# Patient Record
Sex: Male | Born: 1985 | Hispanic: No | Marital: Single | Smoking: Never smoker
Health system: Southern US, Community
[De-identification: ages and names within clinical notes are randomized; demographics above are authoritative.]

## PROBLEM LIST (undated history)

## (undated) DIAGNOSIS — Q622 Congenital megaureter: Secondary | ICD-10-CM

## (undated) DIAGNOSIS — Z8489 Family history of other specified conditions: Secondary | ICD-10-CM

## (undated) DIAGNOSIS — J320 Chronic maxillary sinusitis: Secondary | ICD-10-CM

## (undated) DIAGNOSIS — S01511A Laceration without foreign body of lip, initial encounter: Secondary | ICD-10-CM

## (undated) DIAGNOSIS — R519 Headache, unspecified: Secondary | ICD-10-CM

## (undated) DIAGNOSIS — M5020 Other cervical disc displacement, unspecified cervical region: Secondary | ICD-10-CM

## (undated) DIAGNOSIS — J342 Deviated nasal septum: Secondary | ICD-10-CM

## (undated) DIAGNOSIS — M19079 Primary osteoarthritis, unspecified ankle and foot: Secondary | ICD-10-CM

## (undated) DIAGNOSIS — J322 Chronic ethmoidal sinusitis: Secondary | ICD-10-CM

## (undated) DIAGNOSIS — I890 Lymphedema, not elsewhere classified: Secondary | ICD-10-CM

## (undated) DIAGNOSIS — Z98811 Dental restoration status: Secondary | ICD-10-CM

## (undated) DIAGNOSIS — R51 Headache: Secondary | ICD-10-CM

## (undated) DIAGNOSIS — M5030 Other cervical disc degeneration, unspecified cervical region: Secondary | ICD-10-CM

## (undated) HISTORY — DX: Lymphedema, not elsewhere classified: I89.0

## (undated) HISTORY — PX: SHOULDER SURGERY: SHX246

---
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
## 2008-08-28 ENCOUNTER — Encounter: Admission: RE | Admit: 2008-08-28 | Discharge: 2008-08-28 | Payer: Self-pay | Admitting: Gastroenterology

## 2011-01-09 NOTE — Op Note (Signed)
Paul Humphrey, Paul Humphrey                  ACCOUNT NO.:  [id number]   MEDICAL RECORD NO.:  [id number]          PATIENT TYPE:  AMB   LOCATION:  DSC                          FACILITY:  [facility]   PHYSICIAN:  Loreta Ave, M.D. DATE OF BIRTH:  [birth year]   DATE OF PROCEDURE:  08/12/2005  DATE OF DISCHARGE:                                 OPERATIVE REPORT   PREOPERATIVE DIAGNOSIS:  Symptomatic fascial hernia, lateral compartment,  left leg.   POSTOPERATIVE DIAGNOSIS:  Symptomatic fascial hernia, lateral compartment,  left leg, with partial entrapment of muscle and superficial branch of the  peroneal nerve.   PROCEDURE:  Exploration and then release of fascia over the lateral  compartment, left leg.   SURGEON:  Loreta Ave, M.D.   ASSISTANT:  Genene Churn. Denton Meek.   ANESTHESIA:  General.   ESTIMATED BLOOD LOSS:  Minimal.   TOURNIQUET TIME:  45 minutes.   SPECIMENS:  None.   CULTURES:  None.   COMPLICATIONS:  None.   DRESSINGS:  Soft compressive.   DESCRIPTION OF PROCEDURE:  The patient was brought to the operating room and  after adequate anesthesia had been obtained, a tourniquet was applied to the  upper aspect of the left leg.  Prepped and draped in the usual sterile  fashion.  Exsanguinated with elevation of Esmarch, tourniquet inflated to  350 mmHg.   The hernia was palpable a little bit more than 2 cm in diameter, junction  middle distal third of the leg just behind the intermuscular septum over the  lateral compartment.  Longitudinal incision beginning at the hernia and  going proximal.  Fascia exposed.  Obvious hernia with muscle herniating  through this.  Gently opened and enlarged.  Superficial branch of peroneal  nerve right at the margin of this.  That was protected throughout.  I did a  complete release of the lateral compartment subcutaneously to the proximal  extent and distal extent under direct visualization.  Care taken to avoid  neurovascular structures  with release.  Resolution of the hernia by fascia  release.  Superficial branch of peroneal nerve identified and intact,  protected throughout.  Wound irrigated.  Closed with subcutaneous  and subcuticular Vicryl and Steri-Strips.  Wound injected with Marcaine.  Sterile compressive dressing applied.  Tourniquet deflated and removed.  Anesthesia reversed.   Brought to the recovery room.  Tolerated surgery well, no complications.      Loreta Ave, M.D.  Electronically Signed     DFM/MEDQ  D:  08/12/2005  T:  08/14/2005  Job:  [id number]

## 2011-08-11 ENCOUNTER — Other Ambulatory Visit: Payer: Self-pay | Admitting: Orthopedic Surgery

## 2011-08-11 DIAGNOSIS — M542 Cervicalgia: Secondary | ICD-10-CM

## 2011-08-12 ENCOUNTER — Ambulatory Visit
Admission: RE | Admit: 2011-08-12 | Discharge: 2011-08-12 | Disposition: A | Discharge status: Home / Self Care | Payer: Self-pay | Source: Ambulatory Visit | Attending: Orthopedic Surgery | Admitting: Orthopedic Surgery

## 2011-08-12 DIAGNOSIS — M542 Cervicalgia: Secondary | ICD-10-CM

## 2011-08-25 HISTORY — PX: COLOSTOMY: SHX63

## 2011-08-25 HISTORY — PX: COLECTOMY: SHX59

## 2012-08-15 ENCOUNTER — Telehealth: Payer: Self-pay

## 2012-08-15 ENCOUNTER — Ambulatory Visit (INDEPENDENT_AMBULATORY_CARE_PROVIDER_SITE_OTHER): Payer: BC Managed Care – PPO | Admitting: Emergency Medicine

## 2012-08-15 ENCOUNTER — Ambulatory Visit: Payer: BC Managed Care – PPO

## 2012-08-15 VITALS — BP 116/79 | HR 91 | Temp 98.2°F | Resp 16 | Ht 74.0 in | Wt 234.0 lb

## 2012-08-15 DIAGNOSIS — R05 Cough: Secondary | ICD-10-CM

## 2012-08-15 DIAGNOSIS — R059 Cough, unspecified: Secondary | ICD-10-CM

## 2012-08-15 DIAGNOSIS — J209 Acute bronchitis, unspecified: Secondary | ICD-10-CM

## 2012-08-15 DIAGNOSIS — J018 Other acute sinusitis: Secondary | ICD-10-CM

## 2012-08-15 MED ORDER — HYDROCOD POLST-CHLORPHEN POLST 10-8 MG/5ML PO LQCR: 5.0000 mL | Freq: Two times a day (BID) | ORAL | Status: DC | PRN

## 2012-08-15 MED ORDER — PSEUDOEPHEDRINE-GUAIFENESIN ER 60-600 MG PO TB12: 1.0000 | ORAL_TABLET | Freq: Two times a day (BID) | ORAL | Status: DC

## 2012-08-15 MED ORDER — LEVOFLOXACIN 500 MG PO TABS: 500.0000 mg | ORAL_TABLET | Freq: Every day | ORAL | Status: AC

## 2012-08-15 NOTE — Progress Notes (Signed)
[facility] [address] [place] [address] [phone]  Date:  08/15/2012   Name:  Paul Humphrey   DOB:  May 07, 1986   MRN:  [id number]  PCP:  Janace Hoard, MD    Chief Complaint: Sinusitis   History of Present Illness:  Paul Humphrey is a 27 y.o. very pleasant male patient who presents with the following:  Ill since Wednesday last week with nasal congestion and drainage.  Purulent drainage.  Has a cough that is likewise productive of purulent sputum.  Just finished a course of augmentin related to a perforated colon.  No fever or chills.   Discharged from hospital 12/8.  There is no problem list on file for this patient.   Past Medical History  Diagnosis Date  . Allergy     Past Surgical History  Procedure Date  . Colon surgery     History  Substance Use Topics  . Smoking status: Never Smoker   . Smokeless tobacco: Not on file  . Alcohol Use: No    Family History  Problem Relation Age of Onset  . Cancer Mother   . Arthritis Mother   . Arthritis Father     No Known Allergies  Medication list has been reviewed and updated.  Current Outpatient Prescriptions on File Prior to Visit  Medication Sig Dispense Refill  . Rivaroxaban (XARELTO) 20 MG TABS Take 20 mg by mouth daily.        Review of Systems:  As per HPI, otherwise negative.    Physical Examination: Filed Vitals:   08/15/12 1217  BP: 116/79  Pulse: 91  Temp: 98.2 F (36.8 C)  Resp: 16   Filed Vitals:   08/15/12 1217  Height: 6\' 2"  (1.88 m)  Weight: 234 lb (106.142 kg)   Body mass index is 30.04 kg/(m^2). Ideal Body Weight: Weight in (lb) to have BMI = 25: 194.3   GEN: WDWN, NAD, Non-toxic, A & O x 3 HEENT: Atraumatic, Normocephalic. Neck supple. No masses, No LAD. Ears and Nose: No external deformity. CV: RRR, No M/G/R. No JVD. No thrill. No extra heart sounds. PULM: CTA B, no wheezes, crackles, rhonchi. No retractions. No resp. distress. No accessory muscle  use. ABD: S, NT, ND, +BS. No rebound. No HSM. EXTR: No c/c/e NEURO Normal gait.  PSYCH: Normally interactive. Conversant. Not depressed or anxious appearing.  Calm demeanor.    Assessment and Plan: Bronchitis Sinusitis levaquin mucinex d tussionex Follow up as needed  Carmelina Dane, MD  [facility] reading (PRIMARY) by  Dr. Dareen Piano.  Negative chest.

## 2012-08-15 NOTE — Telephone Encounter (Signed)
PT STOPPED AT CHECKOUT WINDOW AFTER APPT WITH DR Dareen Piano TODAY AND WANTED TO MAKE SURE THE REFERRAL THEY TALKED ABOUT DURING THE OV GETS PLACED. SPOKE ABOUT A REVERSAL FROM RECENT GI SURGERY.  PLEASE CALL PT WITH QUESTIONS. BF

## 2012-08-15 NOTE — Telephone Encounter (Signed)
Please advise on referral, Egan Berkheimer

## 2012-08-24 HISTORY — PX: URETERAL STENT PLACEMENT: SHX822

## 2012-08-24 HISTORY — PX: COLOSTOMY REVERSAL: SHX5782

## 2013-01-26 ENCOUNTER — Ambulatory Visit (INDEPENDENT_AMBULATORY_CARE_PROVIDER_SITE_OTHER): Payer: 59 | Admitting: Physician Assistant

## 2013-01-26 ENCOUNTER — Telehealth: Payer: Self-pay | Admitting: Radiology

## 2013-01-26 VITALS — BP 136/74 | HR 108 | Temp 98.3°F | Resp 18 | Ht 74.0 in | Wt 249.0 lb

## 2013-01-26 DIAGNOSIS — R0602 Shortness of breath: Secondary | ICD-10-CM

## 2013-01-26 DIAGNOSIS — R059 Cough, unspecified: Secondary | ICD-10-CM

## 2013-01-26 DIAGNOSIS — R05 Cough: Secondary | ICD-10-CM

## 2013-01-26 DIAGNOSIS — J019 Acute sinusitis, unspecified: Secondary | ICD-10-CM

## 2013-01-26 MED ORDER — IPRATROPIUM BROMIDE 0.06 % NA SOLN: 2.0000 | Freq: Three times a day (TID) | NASAL | Status: DC

## 2013-01-26 MED ORDER — LEVOFLOXACIN 500 MG PO TABS: 500.0000 mg | ORAL_TABLET | Freq: Every day | ORAL | Status: DC

## 2013-01-26 MED ORDER — HYDROCODONE-HOMATROPINE 5-1.5 MG/5ML PO SYRP: ORAL_SOLUTION | ORAL | Status: DC

## 2013-01-26 NOTE — Telephone Encounter (Signed)
Patient advised d dimer normal he should continue medications

## 2013-01-26 NOTE — Progress Notes (Signed)
Patient ID: Paul Humphrey MRN: [id number], DOB: 03/01/1986, 26 y.o. Date of Encounter: 01/26/2013, 11:37 AM  Primary Physician: Janace Hoard, MD  Chief Complaint: Sinusitis   HPI: 27 y.o. male with history below presents with a 1 week history of increasing nasal congestion, rhinorrhea, sinus pressure, post nasal drip, ST, and cough. Afebrile. No chills. Cough seems to be worse in the morning and when he lays down at nighttime. Some associated SOB without wheezing. States he has to stop himself he tries to take a deep breath. Feels like the cough is secondary to post nasal drip. Sinus pressure is greatest along the maxillary sinuses left greater than right. Muffled hearing waxes and wanes.    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 Colostomy was performed on 07/23/12 secondary to "accidental perforation" in [place]. While he was in the hospital this past fall/winter his IV was infiltrated and he ended up with superficial thrombophlebitis. Because of this he was started on Xarelto. This was stopped sometime in January or February.   No swelling, pain, or erythema of his legs. He does have a history of compartment syndrome along the left leg years ago that required surgery. Because of this he states the left leg has an "air pocket" and is always a little bigger than the right.        Past Medical History  Diagnosis Date  . Allergy      Home Meds: Prior to Admission medications   Medication Sig Start Date End Date Taking? Authorizing Provider  Rivaroxaban (XARELTO) 20 MG TABS Take 20 mg by mouth daily.   No Historical Provider, MD    Allergies: No Known Allergies  History   Social History  . Marital Status: Single    Spouse Name: N/A    Number of Children: N/A  . Years of Education: N/A   Occupational History  . Not on file.   Social History Main Topics  . Smoking status: Never Smoker   . Smokeless tobacco: Not on file  . Alcohol Use: No  . Drug  Use: No  . Sexually Active: Not on file   Other Topics Concern  . Not on file   Social History Narrative  . No narrative on file    Past Surgical History  Procedure Laterality Date  . Colon surgery  07/23/2012    Secondary to perforation   . 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 negative for chills or fever  HEENT: see above Cardiovascular: negative for chest pain, DOE, edema, or palpitations Respiratory: positive for SOB and cough. negative for hemoptysis, wheezing, tachypnea, or dyspnea   Abdominal: negative for abdominal pain, nausea, vomiting, or diarrhea Dermatological: negative for rash Neurologic: positive for headache. negative for dizziness, or syncope   Physical Exam: Blood pressure 136/74, pulse 108, temperature 98.3 F (36.8 C), temperature source Oral, resp. rate 18, height 6\' 2"  (1.88 m), weight 249 lb (112.946 kg), SpO2 98.00%., Body mass index is 31.96 kg/(m^2). General: Well developed, well nourished, in no acute distress. Well appearing.  Head: Normocephalic, atraumatic, eyes without discharge, sclera non-icteric, nares are without discharge. Bilateral auditory canals clear, TM's are without perforation, pearly grey and translucent with reflective cone of light bilaterally. Bilateral maxillary sinus TTP. Oral cavity moist, posterior pharynx with post nasal drip. No exudate, erythema, or peritonsillar abscess. Uvula midline.   Neck: Supple. No thyromegaly. Full ROM. No lymphadenopathy. Lungs: Clear bilaterally  to auscultation without wheezes, rales, or rhonchi. Breathing is unlabored. Heart: RRR with S1 S2. No murmurs, rubs, or gallops appreciated. Msk:  Strength and tone normal for age. Extremities/Skin: Warm and dry. No clubbing or cyanosis. No edema. No rashes or suspicious lesions. Right calf 15 cm down 40.5 cm. Left calf 15 cm down 42 cm. Calves supple, without erythema, or TTP.  Neuro: Alert and oriented X  3. Moves all extremities spontaneously. Gait is normal. CNII-XII grossly in tact. Psych:  Responds to questions appropriately with a normal affect.   Labs:  D dimer STAT  ASSESSMENT AND PLAN:  27 y.o. male with sinusitis, cough, and SOB.  1) Sinusitis  -Levaquin 500 mg 1 po daily #10 no RF -Atrovent NS 0.06% 2 sprays each nare bid prn #1 no RF -Mucinex -Rest/fluids  2) Cough -Hycodan #4oz 1 tsp po q 4-6 hours prn cough no RF SED -Possibly secondary to post nasal drip, see also below  3) SOB -STAT D dimer -History is concerning given his recent surgical procedure and travel. His PPX blood thinner had to be stopped in the hospital secondary to hematuria in the setting of ureteral stents. After thorough discussion he has elected to proceed with a D dimer. If this is elevated we will have him undergo a chest CTA and bilateral lower extremity dopplers.  -Take it easy   -D dimer call report to Amy at 3:23 PM 0.27. Patient notified.    Signed, Eula Listen, PA-C 01/26/2013 11:37 AM

## 2013-11-12 ENCOUNTER — Ambulatory Visit (INDEPENDENT_AMBULATORY_CARE_PROVIDER_SITE_OTHER): Payer: BC Managed Care – PPO | Admitting: Physician Assistant

## 2013-11-12 VITALS — BP 132/78 | HR 94 | Temp 98.3°F | Resp 16 | Ht 74.0 in | Wt 261.6 lb

## 2013-11-12 DIAGNOSIS — J309 Allergic rhinitis, unspecified: Secondary | ICD-10-CM

## 2013-11-12 DIAGNOSIS — J069 Acute upper respiratory infection, unspecified: Secondary | ICD-10-CM

## 2013-11-12 DIAGNOSIS — B9789 Other viral agents as the cause of diseases classified elsewhere: Principal | ICD-10-CM

## 2013-11-12 MED ORDER — BENZONATATE 100 MG PO CAPS: 100.0000 mg | ORAL_CAPSULE | Freq: Three times a day (TID) | ORAL | Status: DC | PRN

## 2013-11-12 MED ORDER — FLUTICASONE PROPIONATE 50 MCG/ACT NA SUSP: 2.0000 | Freq: Every day | NASAL | Status: DC

## 2013-11-12 MED ORDER — HYDROCODONE-HOMATROPINE 5-1.5 MG/5ML PO SYRP: 5.0000 mL | ORAL_SOLUTION | Freq: Three times a day (TID) | ORAL | Status: DC | PRN

## 2013-11-12 NOTE — Progress Notes (Signed)
   Subjective:    Patient ID: Paul Humphrey, male    DOB: 11/05/1985, 28 y.o.   MRN: [id number]  HPI   Paul Humphrey is a pleasant 28 yr old male here with concern for sinus infection.  He reports about 1 week of URI symptoms.  Green/brown/yellow nasal drainage.  Mild headache.  Right ear pressure.  Productive cough.  Post nasal drainage.  Laryngitis.  He has felt feverish but has not taken his temperature.  He denies SOB or wheezing.  No sore throat.  No known sick contacts.  Has used Coldeze and Dimetap for symptoms.  Cough is bothersome at night, dimetapp not providing good relief.  He does have trouble with allergies in spring and fall - uses loratadine prn.    Review of Systems  Constitutional: Positive for fever (subjective). Negative for chills.  HENT: Positive for congestion, ear pain (right), postnasal drip, rhinorrhea and voice change (laryngitis). Negative for sore throat.   Respiratory: Positive for cough. Negative for shortness of breath and wheezing.   Cardiovascular: Negative.   Gastrointestinal: Negative.   Musculoskeletal: Negative.   Skin: Negative.   Neurological: Positive for headaches.       Objective:   Physical Exam  Vitals reviewed. Constitutional: He is oriented to person, place, and time. He appears well-developed and well-nourished. No distress.  HENT:  Head: Normocephalic and atraumatic.  Right Ear: Ear canal normal. A middle ear effusion is present.  Left Ear: Tympanic membrane and ear canal normal.  Nose: Mucosal edema and rhinorrhea present. Right sinus exhibits no maxillary sinus tenderness and no frontal sinus tenderness. Left sinus exhibits no maxillary sinus tenderness and no frontal sinus tenderness.  Mouth/Throat: Uvula is midline, oropharynx is clear and moist and mucous membranes are normal.  Eyes: Conjunctivae are normal. No scleral icterus.  Neck: Neck supple.  Cardiovascular: Normal rate, regular rhythm and normal heart sounds.   Pulmonary/Chest:  Effort normal. He has no wheezes. He has no rales.  Abdominal: Soft. There is no tenderness.  Lymphadenopathy:    He has no cervical adenopathy.  Neurological: He is alert and oriented to person, place, and time.  Skin: Skin is warm and dry.  Psychiatric: He has a normal mood and affect. His behavior is normal.       Assessment & Plan:  Viral URI with cough - Plan: fluticasone (FLONASE) 50 MCG/ACT nasal spray, benzonatate (TESSALON) 100 MG capsule, HYDROcodone-homatropine (HYCODAN) 5-1.5 MG/5ML syrup  Allergic rhinitis - Plan: fluticasone (FLONASE) 50 MCG/ACT nasal spray   Paul Humphrey is a pleasant 28 yr old male with 1 week of URI symptoms.  He is afebrile, VSS, no sinus tenderness, lungs CTA bilaterally.  Suspect viral URI with possible allergic component.  There is no evidence of bacterial infection today.  Will start Flonase - 2 sprays each nostril BID x 1 wk, then decrease to once daily.  Add loratadine.  Consider neti pot or nasal saline.  Tessalon and Hycodan for cough.  Voice rest for laryngitis.  Push fluids, rest.  Discussed RTC precautions including fever, worsening cough, SOB, sinus pain.  Pt to call or RTC if worsening or not improving  E. Natividad Brood MHS, PA-C [facility] 3/22/20159:59 AM

## 2013-11-12 NOTE — Patient Instructions (Signed)
Begin using the Flonase 2 sprays twice daily x 1 week.  Then decrease to once daily.  This will help control allergy symptoms but will also help relieve sinus pressure and ear pressure.  You can continue this through allergy season  Try adding loratadine as well - this well help dry up congestion and stop post-nasal drainage  Consider trying a neti pot or using nasal saline spray - this will flush out your sinuses and keep your nasal passages moist which reduces irritation  Use the Tessalon Perles every 8 hours as needed for cough  Use the Hycodan syrup if needed - will likely make you sleepy  Drink lots of water, get plenty of rest  Please let us know if any symptoms are worsening or not improving   Upper Respiratory Infection, Adult An upper respiratory infection (URI) is also sometimes known as the common cold. The upper respiratory tract includes the nose, sinuses, throat, trachea, and bronchi. Bronchi are the airways leading to the lungs. Most people improve within 1 week, but symptoms can last up to 2 weeks. A residual cough may last even longer.  CAUSES Many different viruses can infect the tissues lining the upper respiratory tract. The tissues become irritated and inflamed and often become very moist. Mucus production is also common. A cold is contagious. You can easily spread the virus to others by oral contact. This includes kissing, sharing a glass, coughing, or sneezing. Touching your mouth or nose and then touching a surface, which is then touched by another person, can also spread the virus. SYMPTOMS  Symptoms typically develop 1 to 3 days after you come in contact with a cold virus. Symptoms vary from person to person. They may include:  Runny nose.  Sneezing.  Nasal congestion.  Sinus irritation.  Sore throat.  Loss of voice (laryngitis).  Cough.  Fatigue.  Muscle aches.  Loss of appetite.  Headache.  Low-grade fever. DIAGNOSIS  You might diagnose your  own cold based on familiar symptoms, since most people get a cold 2 to 3 times a year. Your caregiver can confirm this based on your exam. Most importantly, your caregiver can check that your symptoms are not due to another disease such as strep throat, sinusitis, pneumonia, asthma, or epiglottitis. Blood tests, throat tests, and X-rays are not necessary to diagnose a common cold, but they may sometimes be helpful in excluding other more serious diseases. Your caregiver will decide if any further tests are required. RISKS AND COMPLICATIONS  You may be at risk for a more severe case of the common cold if you smoke cigarettes, have chronic heart disease (such as heart failure) or lung disease (such as asthma), or if you have a weakened immune system. The very young and very old are also at risk for more serious infections. Bacterial sinusitis, middle ear infections, and bacterial pneumonia can complicate the common cold. The common cold can worsen asthma and chronic obstructive pulmonary disease (COPD). Sometimes, these complications can require emergency medical care and may be life-threatening. PREVENTION  The best way to protect against getting a cold is to practice good hygiene. Avoid oral or hand contact with people with cold symptoms. Wash your hands often if contact occurs. There is no clear evidence that vitamin C, vitamin E, echinacea, or exercise reduces the chance of developing a cold. However, it is always recommended to get plenty of rest and practice good nutrition. TREATMENT  Treatment is directed at relieving symptoms. There is no cure. Antibiotics  are not effective, because the infection is caused by a virus, not by bacteria. Treatment may include:  Increased fluid intake. Sports drinks offer valuable electrolytes, sugars, and fluids.  Breathing heated mist or steam (vaporizer or shower).  Eating chicken soup or other clear broths, and maintaining good nutrition.  Getting plenty of  rest.  Using gargles or lozenges for comfort.  Controlling fevers with ibuprofen or acetaminophen as directed by your caregiver.  Increasing usage of your inhaler if you have asthma. Zinc gel and zinc lozenges, taken in the first 24 hours of the common cold, can shorten the duration and lessen the severity of symptoms. Pain medicines may help with fever, muscle aches, and throat pain. A variety of non-prescription medicines are available to treat congestion and runny nose. Your caregiver can make recommendations and may suggest nasal or lung inhalers for other symptoms.  HOME CARE INSTRUCTIONS   Only take over-the-counter or prescription medicines for pain, discomfort, or fever as directed by your caregiver.  Use a warm mist humidifier or inhale steam from a shower to increase air moisture. This may keep secretions moist and make it easier to breathe.  Drink enough water and fluids to keep your urine clear or pale yellow.  Rest as needed.  Return to work when your temperature has returned to normal or as your caregiver advises. You may need to stay home longer to avoid infecting others. You can also use a face mask and careful hand washing to prevent spread of the virus. SEEK MEDICAL CARE IF:   After the first few days, you feel you are getting worse rather than better.  You need your caregiver's advice about medicines to control symptoms.  You develop chills, worsening shortness of breath, or brown or red sputum. These may be signs of pneumonia.  You develop yellow or brown nasal discharge or pain in the face, especially when you bend forward. These may be signs of sinusitis.  You develop a fever, swollen neck glands, pain with swallowing, or white areas in the back of your throat. These may be signs of strep throat. SEEK IMMEDIATE MEDICAL CARE IF:   You have a fever.  You develop severe or persistent headache, ear pain, sinus pain, or chest pain.  You develop wheezing, a  prolonged cough, cough up blood, or have a change in your usual mucus (if you have chronic lung disease).  You develop sore muscles or a stiff neck. Document Released: 02/03/2001 Document Revised: 11/02/2011 Document Reviewed: 12/12/2010 [facility] Patient Information 2014 [place], [place].

## 2013-11-18 ENCOUNTER — Telehealth: Payer: Self-pay

## 2013-11-18 NOTE — Telephone Encounter (Signed)
Pt is needing to talk with someone about his symptoms they have not improved

## 2013-11-18 NOTE — Telephone Encounter (Signed)
Still has cough and drainage. Cough is keeping up at night. Cough meds only working a little bit but not enough to keep him sleep.

## 2013-11-18 NOTE — Telephone Encounter (Signed)
PT IS WANTING TO TALK WITH SOMEONE BECAUSE THE SYMPTOMS HAVE NOT IMPROVED  BEST NUMBER

## 2013-11-19 NOTE — Telephone Encounter (Signed)
Has he been using the Flonase? He can also add Mucinex if not already doing so. Does he have a fever or productive cough?

## 2013-11-20 MED ORDER — GUAIFENESIN-CODEINE 100-10 MG/5ML PO SOLN: 5.0000 mL | Freq: Four times a day (QID) | ORAL | Status: DC | PRN

## 2013-11-20 MED ORDER — AMOXICILLIN 875 MG PO TABS: 1750.0000 mg | ORAL_TABLET | Freq: Two times a day (BID) | ORAL | Status: DC

## 2013-11-20 NOTE — Telephone Encounter (Signed)
Pt needs refill on a cough medication that can be called in. He is on his way back to school in [place]- He is also stating that now the cough is productive with thick green/brown phlegm. Can we also call in an antibiotic?   He needs the prescription to go to : [facility] [id number]

## 2013-11-20 NOTE — Telephone Encounter (Signed)
Meds ordered this encounter  Medications  . amoxicillin (AMOXIL) 875 MG tablet    Sig: Take 2 tablets (1,750 mg total) by mouth 2 (two) times daily.    Dispense:  20 tablet    Refill:  0    Order Specific Question:  Supervising Provider    Answer:  DOOLITTLE, ROBERT P [[id number]]  . guaiFENesin-codeine 100-10 MG/5ML syrup    Sig: Take 5 mLs by mouth every 6 (six) hours as needed for cough.    Dispense:  120 mL    Refill:  0    Order Specific Question:  Supervising Provider    Answer:  DOOLITTLE, ROBERT P [[id number]]    Please PHONE IN guaifenisen + codeine as above.

## 2013-11-20 NOTE — Telephone Encounter (Signed)
Advised pt

## 2014-02-01 ENCOUNTER — Ambulatory Visit (INDEPENDENT_AMBULATORY_CARE_PROVIDER_SITE_OTHER): Payer: BC Managed Care – PPO | Admitting: Podiatry

## 2014-02-01 ENCOUNTER — Encounter: Payer: Self-pay | Admitting: Podiatry

## 2014-02-01 VITALS — BP 114/74 | HR 102 | Resp 16 | Ht 74.0 in | Wt 250.0 lb

## 2014-02-01 DIAGNOSIS — L600 Ingrowing nail: Secondary | ICD-10-CM

## 2014-02-01 NOTE — Patient Instructions (Signed)

## 2014-02-01 NOTE — Progress Notes (Signed)
   Subjective:    Patient ID: Paul Humphrey, male    DOB: 07-01-1986, 28 y.o.   MRN: [id number]  HPI Comments: "I have an ingrown"  Patient c/o aching 1st toe right, medial border, for about 2-3 days. The area is bleeding and red. Yesterday it was draining. He has been soaking in epsom salt and neosporin.   Toe Pain       Review of Systems  Allergic/Immunologic: Positive for food allergies.  All other systems reviewed and are negative.      Objective:   Physical Exam        Assessment & Plan:

## 2014-02-05 NOTE — Progress Notes (Signed)
Subjective:     Patient ID: Paul Humphrey, male   DOB: 28-Oct-1985, 28 y.o.   MRN: [id number]  Toe Pain    patient presents stating I have a painful ingrown toenail on my right toe that I have tried to trim and soaks myself without relief. It's been off and on and worse recently   Review of Systems  All other systems reviewed and are negative.      Objective:   Physical Exam  Nursing note and vitals reviewed. Constitutional: He is oriented to person, place, and time.  Cardiovascular: Intact distal pulses.   Neurological: He is oriented to person, place, and time.  Skin: Skin is warm.   neurovascular status is found to be intact with health history within normal limits and range of motion subtalar midtarsal joint adequate. Muscle strength is adequate no equinus condition is noted and right hallux medial border is found to be incurvated with pain upon palpation to the border and distal redness noted    Assessment:     Ingrown toenail deformity right hallux with mild distal paronychia that's localized    Plan:     H&P reviewed and condition explained to patient. I recommended removal of the corner and doing this permanently so does not recur which is what the patient wants. He understands risk and today I infiltrated 60 mg Xylocaine Marcaine mixture after sterile prep to the area remove the medial corner exposed matrix and applied chemical phenol 3 applications 30 seconds followed by alcohol lavaged and sterile dressing. Gave instructions on soaks and reappoint

## 2014-08-06 ENCOUNTER — Ambulatory Visit (HOSPITAL_COMMUNITY)
Admission: RE | Admit: 2014-08-06 | Discharge: 2014-08-06 | Disposition: A | Discharge status: Home / Self Care | Payer: BC Managed Care – PPO | Source: Ambulatory Visit | Attending: Internal Medicine | Admitting: Internal Medicine

## 2014-08-06 ENCOUNTER — Ambulatory Visit (INDEPENDENT_AMBULATORY_CARE_PROVIDER_SITE_OTHER): Payer: BC Managed Care – PPO | Admitting: Internal Medicine

## 2014-08-06 ENCOUNTER — Telehealth: Payer: Self-pay

## 2014-08-06 VITALS — BP 120/80 | HR 89 | Temp 98.2°F | Resp 16 | Ht 73.0 in | Wt 242.0 lb

## 2014-08-06 DIAGNOSIS — R51 Headache: Secondary | ICD-10-CM | POA: Insufficient documentation

## 2014-08-06 DIAGNOSIS — R42 Dizziness and giddiness: Secondary | ICD-10-CM | POA: Diagnosis not present

## 2014-08-06 DIAGNOSIS — K219 Gastro-esophageal reflux disease without esophagitis: Secondary | ICD-10-CM

## 2014-08-06 DIAGNOSIS — G4459 Other complicated headache syndrome: Secondary | ICD-10-CM

## 2014-08-06 DIAGNOSIS — M542 Cervicalgia: Secondary | ICD-10-CM

## 2014-08-06 DIAGNOSIS — M5090 Cervical disc disorder, unspecified, unspecified cervical region: Secondary | ICD-10-CM

## 2014-08-06 DIAGNOSIS — G8929 Other chronic pain: Secondary | ICD-10-CM

## 2014-08-06 MED ORDER — MELOXICAM 15 MG PO TABS: 15.0000 mg | ORAL_TABLET | Freq: Every day | ORAL | Status: DC

## 2014-08-06 MED ORDER — RANITIDINE HCL 300 MG PO TABS: 300.0000 mg | ORAL_TABLET | Freq: Every day | ORAL | Status: DC

## 2014-08-06 NOTE — Telephone Encounter (Signed)
Ct report that Dr. Laney Pastor ordered is ready, please advise pt

## 2014-08-06 NOTE — Patient Instructions (Signed)
Go to [facility] and register at Radiology for outpatient CT Head

## 2014-08-06 NOTE — Progress Notes (Signed)
   Subjective:    Patient ID: Paul Humphrey, male    DOB: [birth year], 28 y.o.   MRN: [id number]  HPI Complaining of a new onset pressure type headache at the top of the head that has been intermittent for the past 6-8 weeks and progressively worse and progressively more frequent. This is associated with a vague feeling of dizziness and uneasiness but no clear syncope or vertigo. There is been no change in vision. No associated nausea. No peripheral neurological symptoms of numbness or tingling or weakness.  He does have significant right-sided neck discomfort which has been present for more than a year and gets worse with his activities. MRI revealed bulging disks at C4 and 5 but surgery is not indicated.  He works as a Retail buyer stressful semester////he is very concerned about his symptoms and this sometimes interferes with his sleep at night///most concerned about a brain tumor   Past medical history lots of allergies and sinus infections Past surgical history colectomy from colon Trauma now stable   Review of Systems No chest pain or palpitations No shortness of breath No abdominal pain although he does have postprandial dyspepsia for the last 6 weeks for the first time ever//no nocturnal pain/ no alcohol/little caffeine      Objective:   Physical Exam BP 120/80 mmHg  Pulse 89  Temp(Src) 98.2 F (36.8 C) (Oral)  Resp 16  Ht 6\' 1"  (1.854 m)  Wt 242 lb (109.77 kg)  BMI 31.93 kg/m2  SpO2 99% Remains mildly overweight PERRLA and EOMs conjugate without nystagmus Boggy turbinates   TMs clear, throat clear, no cervical nodes, no thyromegaly Heart regular without murmur Lungs clear to auscultation Abdomen benign Extremities without edema Cranial nerves II through XII intact Cerebellar intact No sensory or motor losses Deep tendon reflexes symmetrical The neck range of motion includes discomfort with flexion-extension and  rotation to the right- He feels vaguely lightheaded with change of position but there is no observed nystagmus or loss of posture control     Assessment & Plan:  Other complicated headache syndrome w/ Dizzy spells - Plan: CT Head Wo Contrast, ----with his mild anxiety and obsessive thinking he needs to have brain tumor ruled out and we need to investigate the possibility of underlying sinus disease as a cause of his symptoms//long history of allergic rhinitis     Neck pain of over 3 months duration - Plan: Ambulatory referral to Physical Therapy  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

## 2014-08-06 NOTE — Telephone Encounter (Signed)
Dr. Laney Pastor has been advised.

## 2014-08-07 NOTE — Telephone Encounter (Signed)
Patient LMOM in referrals asking about results of CT Scan please call patient at [phone]

## 2014-08-24 HISTORY — PX: CHOLECYSTECTOMY: SHX55

## 2014-08-24 HISTORY — PX: UMBILICAL HERNIA REPAIR: SHX196

## 2014-08-30 ENCOUNTER — Ambulatory Visit: Payer: BLUE CROSS/BLUE SHIELD

## 2014-09-03 ENCOUNTER — Ambulatory Visit (INDEPENDENT_AMBULATORY_CARE_PROVIDER_SITE_OTHER): Payer: BLUE CROSS/BLUE SHIELD | Admitting: Internal Medicine

## 2014-09-03 VITALS — BP 122/78 | HR 104 | Temp 98.6°F | Resp 18 | Ht 73.5 in | Wt 244.0 lb

## 2014-09-03 DIAGNOSIS — K219 Gastro-esophageal reflux disease without esophagitis: Secondary | ICD-10-CM

## 2014-09-03 DIAGNOSIS — R0789 Other chest pain: Secondary | ICD-10-CM

## 2014-09-03 DIAGNOSIS — M542 Cervicalgia: Secondary | ICD-10-CM

## 2014-09-03 DIAGNOSIS — J069 Acute upper respiratory infection, unspecified: Secondary | ICD-10-CM

## 2014-09-03 DIAGNOSIS — G8929 Other chronic pain: Secondary | ICD-10-CM

## 2014-09-03 DIAGNOSIS — B9789 Other viral agents as the cause of diseases classified elsewhere: Principal | ICD-10-CM

## 2014-09-03 MED ORDER — RANITIDINE HCL 300 MG PO TABS: 300.0000 mg | ORAL_TABLET | Freq: Every day | ORAL | Status: DC

## 2014-09-03 NOTE — Progress Notes (Signed)
Subjective:  This chart was scribed for Paul Koyanagi, MD by Ladene Artist, ED Scribe. The patient was seen in room  5. Patient's care was started at 10:05 AM.   Patient ID: Leanora Humphrey, male    DOB: Jul 20, 1986, 29 y.o.   MRN: [id number]  Chief Complaint  Patient presents with  . Follow-up    meds  . Sinusitis    since the 31   . Nasal Congestion  . Cough    green mucous    HPI HPI Comments: Paul Humphrey is a 29 y.o. male who presents to the [facility] for a follow-up regarding sinusitis. Pt reports gradually improving nasal congestion, sinus pressure, postnasal drip, intermittent cough with green phlegm. Pt states that Zantac has improved. He reports occasional indigestion with eating spicy foods and burnt popcorn.   Chest Pain Pt reports intermittent L sided chest pain onset this morning. He states that he has experienced 8 episodes of sharp, stabbing sensations from 8:16-8:30 AM today with associated nausea that lasted for 4 minutes. He reports a total of 10 episodes since onset this morning. Chest pain is recreated with moving his L shoulder. He denies SOB, diaphoresis, palpitations, ear pain, sleep disturbances. He has had some pain off and on since starting physical therapy for his neck which is greatly improving.  Lips Pt states that he noticed that his lips have felt "pruney" over the past 5 days. He also reports associated numbness and discoloration to his lips that he describes as tan. Pt has been treating with Vaseline without relief. Also having cold symptoms with congestion and nonproductive cough. History of recurrent sinusitis.  Physical Therapy  Pt states that neck pain has improved since staring physical therapy. He reports a stabbing sensation in bilateral chest that lasted for 10 minutes on 08/28/13 following PT.   He also has had an improvement in reflux since starting Zantac this symptom is currently stable.  Past Medical History  Diagnosis  Date  . Allergy    Current Outpatient Prescriptions on File Prior to Visit  Medication Sig Dispense Refill  . ranitidine (ZANTAC) 300 MG tablet Take 1 tablet (300 mg total) by mouth at bedtime. 30 tablet 2  . dextromethorphan-guaiFENesin (MUCINEX DM) 30-600 MG per 12 hr tablet Take 1 tablet by mouth 2 (two) times daily.    . meloxicam (MOBIC) 15 MG tablet Take 1 tablet (15 mg total) by mouth daily. (Patient not taking: Reported on 09/03/2014) 30 tablet 0   No current facility-administered medications on file prior to visit.   Allergies  Allergen Reactions  . 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  HENT: Positive for congestion, postnasal drip and sinus pressure. Negative for ear pain.   Respiratory: Positive for cough. Negative for shortness of breath.   Cardiovascular: Positive for chest pain. Negative for palpitations.  Gastrointestinal: Positive for nausea (resolved).  Neurological: Positive for numbness.  Psychiatric/Behavioral: Negative for sleep disturbance.      Objective:   Physical Exam  Constitutional: He is oriented to person, place, and time. He appears well-developed and well-nourished. No distress.  HENT:  Head: Normocephalic and atraumatic.  Right Ear: Tympanic membrane normal.  Left Ear: Tympanic membrane normal.  Nose: Rhinorrhea (clear) present.  Mouth/Throat: Oropharynx is clear and moist.  Lips are dry and chapped.   Eyes: Conjunctivae and EOM are normal.  Neck: Neck supple.  Cardiovascular: Normal rate and regular rhythm.   Pulmonary/Chest: Effort normal and breath sounds normal.  Tender to L anterior chest wall and the upper ribs. Lungs are clear.   Musculoskeletal: Normal range of motion.  Lymphadenopathy:    He has no cervical adenopathy.  Neurological: He is alert and oriented to person, place, and time.  Skin: Skin is  warm and dry.  Psychiatric: He has a normal mood and affect. His behavior is normal.  Nursing note and vitals reviewed. BP 122/78 mmHg  Pulse 104  Temp(Src) 98.6 F (37 C) (Oral)  Resp 18  Ht 6' 1.5" (1.867 m)  Wt 244 lb (110.678 kg)  BMI 31.75 kg/m2  SpO2 99%    Assessment & Plan:  Viral URI with cough  Gastroesophageal reflux disease without esophagitis  Neck pain of over 3 months duration  Left-sided chest wall pain  Meds ordered this encounter  Medications  . ranitidine (ZANTAC) 300 MG tablet    Sig: Take 1 tablet (300 mg total) by mouth at bedtime.    Dispense:  90 tablet    Refill:  2  12h Sudafed twice a day for 3-4 days Vaseline for lips  I have completed the patient encounter in its entirety as documented by the scribe, with editing by me where necessary. Elasia Furnish P. Laney Pastor, M.D.

## 2014-09-07 ENCOUNTER — Telehealth: Payer: Self-pay

## 2014-09-07 NOTE — Telephone Encounter (Signed)
Call--unusual for zantac---stop it --use maalox for symptoms qid if needed---then use otc pepcid twice a day for a week to see if relieves symptoms

## 2014-09-07 NOTE — Telephone Encounter (Signed)
Pt called. States zantac is keeping him awake at night and is causing some confusion. Please advise. Cb # [phone]

## 2014-09-07 NOTE — Telephone Encounter (Signed)
Spoke to pt- he states that he is starting to experience increased heartburn after taking Zantac and disturbed sleep. He states that this is new- just since refilling script. Upon waking, pt does not have an appetite and has been a little nauseous.   Pt is now out of town in [place] now. He will not be back until May. He would like something different called in.  Pharmacy- [facility] has been changed.

## 2014-09-09 NOTE — Telephone Encounter (Signed)
Pt.notified

## 2014-10-04 DIAGNOSIS — R1013 Epigastric pain: Secondary | ICD-10-CM | POA: Insufficient documentation

## 2014-12-06 ENCOUNTER — Telehealth: Payer: Self-pay

## 2015-01-07 ENCOUNTER — Ambulatory Visit (INDEPENDENT_AMBULATORY_CARE_PROVIDER_SITE_OTHER): Payer: BLUE CROSS/BLUE SHIELD | Admitting: Emergency Medicine

## 2015-01-07 VITALS — BP 110/60 | HR 72 | Temp 98.3°F | Resp 16 | Ht 75.0 in | Wt 229.0 lb

## 2015-01-07 DIAGNOSIS — L659 Nonscarring hair loss, unspecified: Secondary | ICD-10-CM | POA: Diagnosis not present

## 2015-01-07 DIAGNOSIS — R202 Paresthesia of skin: Secondary | ICD-10-CM | POA: Insufficient documentation

## 2015-01-07 DIAGNOSIS — R109 Unspecified abdominal pain: Secondary | ICD-10-CM | POA: Insufficient documentation

## 2015-01-07 DIAGNOSIS — E559 Vitamin D deficiency, unspecified: Secondary | ICD-10-CM

## 2015-01-07 DIAGNOSIS — R1084 Generalized abdominal pain: Secondary | ICD-10-CM | POA: Diagnosis not present

## 2015-01-07 DIAGNOSIS — M542 Cervicalgia: Secondary | ICD-10-CM

## 2015-01-07 DIAGNOSIS — F439 Reaction to severe stress, unspecified: Secondary | ICD-10-CM

## 2015-01-07 DIAGNOSIS — Z658 Other specified problems related to psychosocial circumstances: Secondary | ICD-10-CM

## 2015-01-07 DIAGNOSIS — Z202 Contact with and (suspected) exposure to infections with a predominantly sexual mode of transmission: Secondary | ICD-10-CM

## 2015-01-07 DIAGNOSIS — Z77018 Contact with and (suspected) exposure to other hazardous metals: Secondary | ICD-10-CM | POA: Diagnosis not present

## 2015-01-07 LAB — COMPREHENSIVE METABOLIC PANEL
ALBUMIN: 4.2 g/dL (ref 3.5–5.2)
ALT: 32 U/L (ref 0–53)
AST: 24 U/L (ref 0–37)
Alkaline Phosphatase: 35 U/L — ABNORMAL LOW (ref 39–117)
BUN: 18 mg/dL (ref 6–23)
CALCIUM: 9.1 mg/dL (ref 8.4–10.5)
CHLORIDE: 103 meq/L (ref 96–112)
CO2: 25 meq/L (ref 19–32)
Creat: 0.95 mg/dL (ref 0.50–1.35)
Glucose, Bld: 83 mg/dL (ref 70–99)
POTASSIUM: 4.3 meq/L (ref 3.5–5.3)
SODIUM: 139 meq/L (ref 135–145)
TOTAL PROTEIN: 6.8 g/dL (ref 6.0–8.3)
Total Bilirubin: 0.6 mg/dL (ref 0.2–1.2)

## 2015-01-07 LAB — POCT CBC
Granulocyte percent: 69.5 %G (ref 37–80)
HEMATOCRIT: 47.5 % (ref 43.5–53.7)
HEMOGLOBIN: 15.7 g/dL (ref 14.1–18.1)
LYMPH, POC: 1.7 (ref 0.6–3.4)
MCH: 28.9 pg (ref 27–31.2)
MCHC: 33.1 g/dL (ref 31.8–35.4)
MCV: 87.4 fL (ref 80–97)
MID (cbc): 0.3 (ref 0–0.9)
MPV: 8.1 fL (ref 0–99.8)
POC Granulocyte: 4.4 (ref 2–6.9)
POC LYMPH PERCENT: 26.3 %L (ref 10–50)
POC MID %: 4.2 % (ref 0–12)
Platelet Count, POC: 151 10*3/uL (ref 142–424)
RBC: 5.44 M/uL (ref 4.69–6.13)
RDW, POC: 14 %
WBC: 6.3 10*3/uL (ref 4.6–10.2)

## 2015-01-07 LAB — TSH: TSH: 0.843 u[IU]/mL (ref 0.350–4.500)

## 2015-01-07 NOTE — Progress Notes (Signed)
Subjective:  This chart was scribed for Nena Jordan, MD by [facility], medical scribe at [facility].The patient was seen in exam room 03 and the patient's care was started at 12:53 PM.   Patient ID: Paul Humphrey, male    DOB: 01-Apr-1986, 29 y.o.   MRN: [id number] Chief Complaint  Patient presents with  . Back Pain  . Labs Only   HPI HPI Comments: Paul Humphrey is a 29 y.o. male who presents to [facility] for referral to neurology and GI. Recently moved here from [place]. 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 Possible heavy metal exposure in [place]. He first noticed his hair thinning and his skin broke out in a rash. He has since developed GI and neurologic symptoms. Pt was unable to see specialists until June in [place].  PCP concerned something neurological is the underlying problem. Random tingling and sharp stabs throughout the body. Numbness lower left hand left foot. Neck pain, 2012 MRI showed C5-C6 discs were bulging. X-ray in the last two weeks showed no issue.  Seen by a GI specialist  and had an endoscope, biopsy. Signs of recent inflammation and producing high acid. He has acid reflux which was worse when on ranitidine and omeprazole. Perforated colon in Nov 2015. He has had two hernia surgery. 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   Developed arrhythmia and taking Carafate. The most recent heart test and stress test showed no presence of arrhythmia.  No counseling or therapy.   Review of Systems  Gastrointestinal: Positive for abdominal pain.  Neurological: Positive for numbness.      Objective:  BP 110/60 mmHg  Pulse 72  Temp(Src) 98.3 F (36.8 C) (Oral)  Resp 16  Ht 6\' 3"  (1.905 m)  Wt 229 lb (103.874 kg)  BMI 28.62 kg/m2  SpO2 98% Physical Exam  Nursing note and vitals reviewed. CONSTITUTIONAL: Well developed/well nourished, alert and cooperative. HEAD: Normocephalic/atraumatic EYES:  EOMI/PERRL ENMT: Mucous membranes moist NECK: supple no meningeal signs SPINE/BACK:entire spine nontender CV: S1/S2 noted, no murmurs/rubs/gallops noted LUNGS: Lungs are clear to auscultation bilaterally, no apparent distress,  ABDOMEN: soft, nontender, no rebound or guarding, bowel sounds noted throughout abdomen multiple surgical scars noted, no masses. GU:no cva tenderness NEURO: Pt is awake/alert/appropriate, moves all extremitiesx4.  No facial droop. Cranial nerves 2-12 intact. EXTREMITIES: pulses normal/equal, full ROM SKIN: warm, color normal PSYCH: no abnormalities of mood noted, alert and oriented to situation    Assessment & Plan:  This is a very complicated patient. He has had multiple surgical procedures. He inserted a foreign body in his rectum and had a bowel perforation requiring colostomy. This was reanastomosed. He had surgery for gallbladder and for incisional hernias. He has been bothered with chronic GI problems. He has moved down here to be with his mother. He is concerned he has been poisoned by the water supply where he is living in [place]. He says multiple coworkers had developed different medical problems which have included leukemia and cancers. He needs referrals to GI and referrals to neurology. He also has a history of partial blockage of the left ureters and will eventually need a referral to urology. I told him with all that he has been through over the last year he should have counseling. Routine labs were done to include HIV and syphilis testing. I also ordered heavy metal screens. I will see the patient back when I  returned from [place].I personally performed the services described in this documentation, which was scribed in my presence. The recorded information has been reviewed and is accurate.  Nena Jordan, MD

## 2015-01-07 NOTE — Patient Instructions (Signed)
I will see you June 1 between 8 and 1.

## 2015-01-08 LAB — VITAMIN D 25 HYDROXY (VIT D DEFICIENCY, FRACTURES): VIT D 25 HYDROXY: 23 ng/mL — AB (ref 30–100)

## 2015-01-08 LAB — RPR

## 2015-01-08 LAB — SEDIMENTATION RATE: SED RATE: 1 mm/h (ref 0–15)

## 2015-01-08 LAB — HIV ANTIBODY (ROUTINE TESTING W REFLEX): HIV 1&2 Ab, 4th Generation: NONREACTIVE

## 2015-01-15 ENCOUNTER — Telehealth: Payer: Self-pay | Admitting: Family Medicine

## 2015-01-15 LAB — HEAVY METALS SCREEN, URINE
Arsenic, 24H Ur: <2 mcg/L (ref <81)
Lead, Urine (24 Hr): <1 mcg/L (ref <80)

## 2015-01-15 NOTE — Telephone Encounter (Signed)
Patient has my chart and already seen lab results

## 2015-01-22 NOTE — Telephone Encounter (Signed)
No msg °

## 2015-01-23 ENCOUNTER — Ambulatory Visit (INDEPENDENT_AMBULATORY_CARE_PROVIDER_SITE_OTHER): Payer: BLUE CROSS/BLUE SHIELD | Admitting: Emergency Medicine

## 2015-01-23 DIAGNOSIS — M546 Pain in thoracic spine: Secondary | ICD-10-CM | POA: Diagnosis not present

## 2015-01-23 DIAGNOSIS — Z77018 Contact with and (suspected) exposure to other hazardous metals: Secondary | ICD-10-CM | POA: Diagnosis not present

## 2015-01-23 DIAGNOSIS — R0789 Other chest pain: Secondary | ICD-10-CM

## 2015-01-23 DIAGNOSIS — E041 Nontoxic single thyroid nodule: Secondary | ICD-10-CM | POA: Diagnosis not present

## 2015-01-23 DIAGNOSIS — R202 Paresthesia of skin: Secondary | ICD-10-CM

## 2015-01-23 LAB — POCT URINALYSIS DIPSTICK
Bilirubin, UA: NEGATIVE
Blood, UA: NEGATIVE
Glucose, UA: NEGATIVE
Ketones, UA: NEGATIVE
Leukocytes, UA: NEGATIVE
NITRITE UA: NEGATIVE
PROTEIN UA: NEGATIVE
SPEC GRAV UA: 1.015
UROBILINOGEN UA: 0.2
pH, UA: 6

## 2015-01-23 LAB — POCT UA - MICROSCOPIC ONLY
CRYSTALS, UR, HPF, POC: NEGATIVE
Casts, Ur, LPF, POC: NEGATIVE
Mucus, UA: NEGATIVE
RBC, urine, microscopic: NEGATIVE
WBC, Ur, HPF, POC: NEGATIVE
Yeast, UA: NEGATIVE

## 2015-01-23 NOTE — Progress Notes (Deleted)
   Subjective:    Patient ID: Paul Humphrey, male    DOB: Jun 30, 1986, 29 y.o.   MRN: [id number]  HPI    Review of Systems     Objective:   Physical Exam        Assessment & Plan:

## 2015-01-23 NOTE — Progress Notes (Addendum)
Subjective:    Patient ID: Paul Humphrey, male    DOB: 12-Dec-1985, 29 y.o.   MRN: [id number] This chart was scribed for Darlyne Russian, MD by [place], ED Scribe. The patient was seen in [facility]. The patient's care was started at 12:12 PM.  Chief Complaint  Patient presents with  . Follow-up    Discuss labs  . Urinary Tract Infection    lower back pain - x 10 days ago     HPI  HPI Comments: Paul Humphrey is a 29 y.o. male who presents to the [facility] seeking follow up for UTI diagnosis with low back pain over past 10 days. No complaints of penile discharge or fever. Pt reports that his appt to see the Urologist has been pushed back to the end of this month, 06/30.  He also notes experiencing intermittent costal margin  pain bilaterally over the past 5 months which has been causing him discomfort. Pt states that he has been currently looking for a job but has not found one yet. Pt is non-smoker.    Past Medical History  Diagnosis Date  . Allergy    Past Surgical History  Procedure Laterality Date  . Colon surgery  07/23/2012    Secondary to perforation   . 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 Lactose Intolerance (Gi) Nausea And Vomiting  . Percocet [Oxycodone-Acetaminophen] Other (See Comments)    Skin crawling/burning   Current Outpatient Prescriptions on File Prior to Visit  Medication Sig Dispense Refill  . Multiple Vitamins-Minerals (MULTIVITAMIN ADULT PO) Take by mouth.    . sucralfate (CARAFATE) 1 GM/10ML suspension Take 1 g by mouth 4 (four) times daily -  with meals and at bedtime.    . meloxicam (MOBIC) 15 MG tablet Take 1 tablet (15 mg total) by mouth daily. (Patient not taking: Reported on 09/03/2014) 30 tablet 0  . ranitidine (ZANTAC) 300 MG tablet Take 1 tablet (300 mg total) by mouth at bedtime. (Patient not taking: Reported on 01/07/2015) 90 tablet 2   No current facility-administered medications on file prior to visit.      Review of  Systems  Constitutional: Negative for fever.  Gastrointestinal: Positive for abdominal pain.  Genitourinary: Negative for discharge.  Musculoskeletal: Positive for back pain.       Objective:   Physical Exam  Constitutional: He is oriented to person, place, and time. He appears well-developed and well-nourished. No distress.  HENT:  Head: Normocephalic and atraumatic.  The right lobe of the thyroid is more prominent than the left.  Eyes: Conjunctivae and EOM are normal.  Neck: Neck supple. No tracheal deviation present.  Cardiovascular: Normal rate.   Pulmonary/Chest: Effort normal. No respiratory distress.  Abdominal:  Multiple healed scares with 1O8NO periumbilical hernia.   Musculoskeletal: Normal range of motion.  Neurological: He is alert and oriented to person, place, and time.  Skin: Skin is warm and dry.  Psychiatric: He has a normal mood and affect. His behavior is normal.  Nursing note and vitals reviewed.  Results for orders placed or performed in visit on 01/07/15  Sedimentation Rate  Result Value Ref Range   Sed Rate 1 0 - 15 mm/hr  Comprehensive metabolic panel  Result Value Ref Range   Sodium 139 135 - 145 mEq/L   Potassium 4.3 3.5 - 5.3 mEq/L   Chloride 103 96 - 112 mEq/L   CO2 25 19 - 32 mEq/L   Glucose, Bld 83 70 -  99 mg/dL   BUN 18 6 - 23 mg/dL   Creat 0.95 0.50 - 1.35 mg/dL   Total Bilirubin 0.6 0.2 - 1.2 mg/dL   Alkaline Phosphatase 35 (L) 39 - 117 U/L   AST 24 0 - 37 U/L   ALT 32 0 - 53 U/L   Total Protein 6.8 6.0 - 8.3 g/dL   Albumin 4.2 3.5 - 5.2 g/dL   Calcium 9.1 8.4 - 10.5 mg/dL  TSH  Result Value Ref Range   TSH 0.843 0.350 - 4.500 uIU/mL  Vitamin D, 25-hydroxy  Result Value Ref Range   Vit D, 25-Hydroxy 23 (L) 30 - 100 ng/mL  HIV antibody (with reflex)  Result Value Ref Range   HIV 1&2 Ab, 4th Generation NONREACTIVE NONREACTIVE  RPR  Result Value Ref Range   RPR Ser Ql NON REAC NON REAC  Heavy metals screen, urine  Result Value  Ref Range   Arsenic, 24H Ur < 2 < 81 mcg/L   Lead, Urine (24 Hr) < 1 < 80 mcg/L   Mercury 24 Hr Urine < 2 < 21 mcg/L  POCT CBC  Result Value Ref Range   WBC 6.3 4.6 - 10.2 K/uL   Lymph, poc 1.7 0.6 - 3.4   POC LYMPH PERCENT 26.3 10 - 50 %L   MID (cbc) 0.3 0 - 0.9   POC MID % 4.2 0 - 12 %M   POC Granulocyte 4.4 2 - 6.9   Granulocyte percent 69.5 37 - 80 %G   RBC 5.44 4.69 - 6.13 M/uL   Hemoglobin 15.7 14.1 - 18.1 g/dL   HCT, POC 47.5 43.5 - 53.7 %   MCV 87.4 80 - 97 fL   MCH, POC 28.9 27 - 31.2 pg   MCHC 33.1 31.8 - 35.4 g/dL   RDW, POC 14.0 %   Platelet Count, POC 151 142 - 424 K/uL   MPV 8.1 0 - 99.8 fL   Results for orders placed or performed in visit on 01/07/15  Sedimentation Rate  Result Value Ref Range   Sed Rate 1 0 - 15 mm/hr  Comprehensive metabolic panel  Result Value Ref Range   Sodium 139 135 - 145 mEq/L   Potassium 4.3 3.5 - 5.3 mEq/L   Chloride 103 96 - 112 mEq/L   CO2 25 19 - 32 mEq/L   Glucose, Bld 83 70 - 99 mg/dL   BUN 18 6 - 23 mg/dL   Creat 0.95 0.50 - 1.35 mg/dL   Total Bilirubin 0.6 0.2 - 1.2 mg/dL   Alkaline Phosphatase 35 (L) 39 - 117 U/L   AST 24 0 - 37 U/L   ALT 32 0 - 53 U/L   Total Protein 6.8 6.0 - 8.3 g/dL   Albumin 4.2 3.5 - 5.2 g/dL   Calcium 9.1 8.4 - 10.5 mg/dL  TSH  Result Value Ref Range   TSH 0.843 0.350 - 4.500 uIU/mL  Vitamin D, 25-hydroxy  Result Value Ref Range   Vit D, 25-Hydroxy 23 (L) 30 - 100 ng/mL  HIV antibody (with reflex)  Result Value Ref Range   HIV 1&2 Ab, 4th Generation NONREACTIVE NONREACTIVE  RPR  Result Value Ref Range   RPR Ser Ql NON REAC NON REAC  Heavy metals screen, urine  Result Value Ref Range   Arsenic, 24H Ur < 2 < 81 mcg/L   Lead, Urine (24 Hr) < 1 < 80 mcg/L   Mercury 24 Hr Urine < 2 < 21  mcg/L  POCT CBC  Result Value Ref Range   WBC 6.3 4.6 - 10.2 K/uL   Lymph, poc 1.7 0.6 - 3.4   POC LYMPH PERCENT 26.3 10 - 50 %L   MID (cbc) 0.3 0 - 0.9   POC MID % 4.2 0 - 12 %M   POC Granulocyte  4.4 2 - 6.9   Granulocyte percent 69.5 37 - 80 %G   RBC 5.44 4.69 - 6.13 M/uL   Hemoglobin 15.7 14.1 - 18.1 g/dL   HCT, POC 47.5 43.5 - 53.7 %   MCV 87.4 80 - 97 fL   MCH, POC 28.9 27 - 31.2 pg   MCHC 33.1 31.8 - 35.4 g/dL   RDW, POC 14.0 %   Platelet Count, POC 151 142 - 424 K/uL   MPV 8.1 0 - 99.8 fL   Results for orders placed or performed in visit on 01/23/15  POCT urinalysis dipstick  Result Value Ref Range   Color, UA yellow    Clarity, UA clear    Glucose, UA neg    Bilirubin, UA neg    Ketones, UA neg    Spec Grav, UA 1.015    Blood, UA neg    pH, UA 6.0    Protein, UA neg    Urobilinogen, UA 0.2    Nitrite, UA neg    Leukocytes, UA Negative   POCT UA - Microscopic Only  Result Value Ref Range   WBC, Ur, HPF, POC neg    RBC, urine, microscopic neg    Bacteria, U Microscopic net    Mucus, UA neg    Epithelial cells, urine per micros 0-1    Crystals, Ur, HPF, POC neg    Casts, Ur, LPF, POC neg    Yeast, UA neg     There were no vitals filed for this visit.  12:19 PM- Treatment plan was discussed with patient who verbalizes understanding and agrees.       Assessment & Plan:  1. Midline thoracic back pain Urine tests are normal I suspect this is musculoskeletal. - GC/Chlamydia Probe Amp - POCT urinalysis dipstick - POCT UA - Microscopic Only  2. Right thyroid nodule  - US Soft Tissue Head/Neck; Future  3. Tingling sensation He hasn't up coming appointment with the  4. Heavy metal exposure Heavy metal screen is negative.  5. Left-sided chest wall pain I feel this is musculoskeletal. I did check his breasts and did not feel any lumps on breast exam.   I personally performed the services described in this documentation, which was scribed in my presence. The recorded information has been reviewed and is accurate.  Arlyss Queen, MD  [facility]  01/23/2015 2:48 PM

## 2015-01-25 ENCOUNTER — Ambulatory Visit (INDEPENDENT_AMBULATORY_CARE_PROVIDER_SITE_OTHER): Payer: BLUE CROSS/BLUE SHIELD | Admitting: Neurology

## 2015-01-25 ENCOUNTER — Encounter: Payer: Self-pay | Admitting: Neurology

## 2015-01-25 VITALS — BP 124/84 | HR 79 | Ht 75.0 in | Wt 230.5 lb

## 2015-01-25 DIAGNOSIS — R202 Paresthesia of skin: Secondary | ICD-10-CM | POA: Diagnosis not present

## 2015-01-25 DIAGNOSIS — R209 Unspecified disturbances of skin sensation: Secondary | ICD-10-CM | POA: Diagnosis not present

## 2015-01-25 DIAGNOSIS — G43109 Migraine with aura, not intractable, without status migrainosus: Secondary | ICD-10-CM | POA: Diagnosis not present

## 2015-01-25 DIAGNOSIS — M5412 Radiculopathy, cervical region: Secondary | ICD-10-CM

## 2015-01-25 LAB — GC/CHLAMYDIA PROBE AMP
CT Probe RNA: NEGATIVE
GC PROBE AMP APTIMA: NEGATIVE

## 2015-01-25 NOTE — Patient Instructions (Signed)
1.  MRI brain with and without contrast 2.  EMG of the left arm and leg 3.  Telephone update with results

## 2015-01-25 NOTE — Progress Notes (Signed)
[facility] Clinic Note - Initial Visit   Date: 01/25/2015   Paul Humphrey MRN: [id number] DOB: 1986-07-17   Dear Dr. Everlene Farrier:  Thank you for your kind referral of [name] for consultation of whole body tingling sensation. Although his history is well known to you, please allow [place] to reiterate it for the purpose of our medical record. The patient was accompanied to the clinic by self.    History of Present Illness: Paul Humphrey is a 29 y.o. right-handed Caucasian male with vitamin D deficiency and GERD presenting for evaluation of whole body tingling sensation.    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  He reports that all of his students and co-workers are getting GI, neurological, and cardiac symptoms and is concerned about heavy metal exposure.  Here, he was tested for heavy metal toxicity which returned normal.  In March, he started developing tingling and needle-like sensation of the whole body (head, chest, arms, leg), worse on the left.  Symptoms are intermittent and last for minutes to hours, occuring several times per week.  He has had up to two-week period of no symptoms and they have improved since he moved from [place].  He has associated tingling and sharp pain over the head.  He has not noticed an exacerbating or alleviating factors.    He has a CT head in December 2015 which was normal.  He also has known small disc protrusion at C5-6 seen on MRI from 2012.  He reports having neck discomfort 2 weeks ago along with left hand weakness.   Out-side paper records, electronic medical record, and images have been reviewed where available and summarized as:  CT head 08/06/2014:  Normal  MRI cervical spine wo contrast 08/12/2011:  1.  Small central subligamentous disc protrusion at C5-6 slightly asymmetric to the right with minimal mass effect upon the right C6 ventral nerve  rootlet. 2.  Otherwise, no significant abnormalities.  Labs 01/07/2015:  HIV NR, heavy metal screen negative, RPR NR, vitamin D 23*, TSH 0.84, ESR 1,   Past Medical History  Diagnosis Date  . Allergy     Past Surgical History  Procedure Laterality Date  . Colon surgery  07/23/2012    Secondary to perforation   . 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 meloxicam (MOBIC) 15 MG tablet Take 1 tablet (15 mg total) by mouth daily. (Patient not taking: Reported on 09/03/2014) 30 tablet 0  . Multiple Vitamins-Minerals (MULTIVITAMIN ADULT PO) Take by mouth.    . ranitidine (ZANTAC) 300 MG tablet Take 1 tablet (300 mg total) by mouth at bedtime. (Patient not taking: Reported on 01/07/2015) 90 tablet 2  . sucralfate (CARAFATE) 1 GM/10ML suspension Take 1 g by mouth 4 (four) times daily -  with meals and at bedtime.     No current facility-administered medications on file prior to visit.    Allergies:  Allergies  Allergen Reactions  . Lactose Intolerance (Gi) Nausea And Vomiting  . Percocet [Oxycodone-Acetaminophen] Other (See Comments)    Skin crawling/burning    Family History: Family History  Problem Relation Age of Onset  . Cancer Mother   . Arthritis Mother   . Arthritis Father     Social History: History   Social History  . Marital Status: Single  Spouse Name: N/A  . Number of Children: N/A  . Years of Education: N/A   Occupational History  . Not on file.   Social History Main Topics  . Smoking status: Never Smoker   . Smokeless tobacco: Not on file  . Alcohol Use: No  . Drug Use: No  . Sexual Activity: Not on file   Other Topics Concern  . Not on file   Social History Narrative    Review of Systems:  CONSTITUTIONAL: No fevers, chills, night sweats, or weight loss.   EYES: No visual changes or eye pain ENT: No hearing changes.  No history of nose bleeds.    RESPIRATORY: No cough, wheezing and shortness of breath.   CARDIOVASCULAR: Negative for chest pain, and palpitations.   GI: + abdominal discomfort, blood in stools or black stools.  No recent change in bowel habits.   GU:  No history of incontinence.   MUSCLOSKELETAL: +history of joint pain or swelling.  +myalgias.   SKIN: Negative for lesions, rash, and itching.   HEMATOLOGY/ONCOLOGY: Negative for prolonged bleeding, bruising easily, and swollen nodes.  No history of cancer.   ENDOCRINE: Negative for cold or heat intolerance, polydipsia or goiter.   PSYCH:  No depression or anxiety symptoms.   NEURO: As Above.   Vital Signs:  BP 124/84 mmHg  Pulse 79  Ht _0  (1.905 m)  Wt 230 lb 8 oz (104.554 kg)  BMI 28.81 kg/m2  SpO2 98%   General Medical Exam:   General:  Well appearing, comfortable.   Eyes/ENT: see cranial nerve examination.   Neck: No masses appreciated.  Full range of motion without tenderness.  No carotid bruits. Respiratory:  Clear to auscultation, good air entry bilaterally.   Cardiac:  Regular rate and rhythm, no murmur.   Extremities:  No deformities, edema, or skin discoloration.  Skin:  No rashes or lesions.  Neurological Exam: MENTAL STATUS including orientation to time, place, person, recent and remote memory, attention span and concentration, language, and fund of knowledge is normal.  Speech is not dysarthric.  CRANIAL NERVES: II:  No visual field defects.  Unremarkable fundi.   III-IV-VI: Pupils equal round and reactive to light.  Normal conjugate, extra-ocular eye movements in all directions of gaze.  No nystagmus.  No ptosis.   V:  Normal facial sensation.  VII:  Normal facial symmetry and movements.  No pathologic facial reflexes.  VIII:  Normal hearing and vestibular function.   IX-X:  Normal palatal movement.   XI:  Normal shoulder shrug and head rotation.   XII:  Normal tongue strength and range of motion, no deviation or fasciculation.  MOTOR:   No atrophy, fasciculations or abnormal movements.  No pronator drift.  Tone is normal.    Right Upper Extremity:    Left Upper Extremity:    Deltoid  5/5   Deltoid  5/5   Biceps  5/5   Biceps  5/5   Triceps  5/5   Triceps  5/5   Wrist extensors  5/5   Wrist extensors  5/5   Wrist flexors  5/5   Wrist flexors  5/5   Finger extensors  5/5   Finger extensors  5/5   Finger flexors  5/5   Finger flexors  5/5   Dorsal interossei  5/5   Dorsal interossei  5/5   Abductor pollicis  5/5   Abductor pollicis  5/5   Tone (Ashworth scale)  0  Tone (Ashworth scale)  0   Right Lower Extremity:    Left Lower Extremity:    Hip flexors  5/5   Hip flexors  5/5   Hip extensors  5/5   Hip extensors  5/5   Knee flexors  5/5   Knee flexors  5/5   Knee extensors  5/5   Knee extensors  5/5   Dorsiflexors  5/5   Dorsiflexors  5/5   Plantarflexors  5/5   Plantarflexors  5/5   Toe extensors  5/5   Toe extensors  5/5   Toe flexors  5/5   Toe flexors  5/5   Tone (Ashworth scale)  0  Tone (Ashworth scale)  0   MSRs:  Right                                                                 Left brachioradialis 2+  brachioradialis 2+  biceps 2+  biceps 2+  triceps 2+  triceps 2+  patellar 2+  patellar 2+  ankle jerk 2+  ankle jerk 2+  Hoffman no  Hoffman no  plantar response down  plantar response down   SENSORY:  Normal and symmetric perception of light touch, pinprick, vibration, and proprioception.  Romberg's sign absent.   COORDINATION/GAIT: Normal finger-to- nose-finger and heel-to-shin.  Intact rapid alternating movements bilaterally.  Able to rise from a chair without using arms.  Gait narrow based and stable. Tandem and stressed gait intact.    IMPRESSION: Mr. Strick is a 29 year-old gentleman presenting for evaluation of whole body paresthesias.  He has fluctating and migratory symptoms which does not conform to an anatomical distribution. With his normal exam and improving symptoms, my suspicion for a  worrisome neurological disorder is low, but patient is highly anxious about his symptoms, so will to do the work-up.  Labs including HIV, RPR, ESR, and heavy metal screen has been normal.  He is also concerned about worsening neck pain with his known mild disc protrusion at C5-6 and is requesting to have repeat imaging of this area.  If testing returned normal, it is possible symptoms are either migraine variant or manifestation of stress reaction.  Medications are not indicated, as symptoms are improving.   PLAN/RECOMMENDATIONS:  1.  MRI brain and cervical spine wwo contrast 2.  EMG of the left arm and leg 3.  Telephone update with results   The duration of this appointment visit was 35 minutes of face-to-face time with the patient.  Greater than 50% of this time was spent in counseling, explanation of diagnosis, planning of further management, and coordination of care.   Thank you for allowing me to participate in patient's care.  If I can answer any additional questions, I would be pleased to do so.    Sincerely,    Jaye Saal K. Posey Pronto, DO

## 2015-01-28 ENCOUNTER — Ambulatory Visit
Admission: RE | Admit: 2015-01-28 | Discharge: 2015-01-28 | Disposition: A | Discharge status: Home / Self Care | Payer: BLUE CROSS/BLUE SHIELD | Source: Ambulatory Visit | Attending: Emergency Medicine | Admitting: Emergency Medicine

## 2015-01-28 DIAGNOSIS — E041 Nontoxic single thyroid nodule: Secondary | ICD-10-CM

## 2015-02-05 ENCOUNTER — Ambulatory Visit
Admission: RE | Admit: 2015-02-05 | Discharge: 2015-02-05 | Disposition: A | Discharge status: Home / Self Care | Payer: BLUE CROSS/BLUE SHIELD | Source: Ambulatory Visit | Attending: Neurology | Admitting: Neurology

## 2015-02-05 DIAGNOSIS — M5412 Radiculopathy, cervical region: Secondary | ICD-10-CM

## 2015-02-05 DIAGNOSIS — G43109 Migraine with aura, not intractable, without status migrainosus: Secondary | ICD-10-CM

## 2015-02-05 DIAGNOSIS — R202 Paresthesia of skin: Secondary | ICD-10-CM

## 2015-02-05 DIAGNOSIS — R209 Unspecified disturbances of skin sensation: Secondary | ICD-10-CM

## 2015-02-05 MED ORDER — GADOBENATE DIMEGLUMINE 529 MG/ML IV SOLN
15.0000 mL | Freq: Once | INTRAVENOUS | Status: AC | PRN
  Administered 2015-02-05: 15 mL via INTRAVENOUS

## 2015-02-11 ENCOUNTER — Other Ambulatory Visit: Payer: Self-pay | Admitting: Emergency Medicine

## 2015-02-11 ENCOUNTER — Encounter: Payer: Self-pay | Admitting: Emergency Medicine

## 2015-02-11 DIAGNOSIS — J329 Chronic sinusitis, unspecified: Secondary | ICD-10-CM

## 2015-02-19 ENCOUNTER — Ambulatory Visit: Payer: Self-pay | Admitting: Neurology

## 2015-02-21 ENCOUNTER — Ambulatory Visit: Payer: Self-pay | Admitting: Neurology

## 2015-03-06 ENCOUNTER — Telehealth: Payer: Self-pay

## 2015-03-06 NOTE — Telephone Encounter (Signed)
sucralfate (CARAFATE) 1 GM/10ML suspension   Requesting refill from dr. Noralee [facility] on [address]   [phone]

## 2015-03-13 ENCOUNTER — Telehealth: Payer: Self-pay

## 2015-03-13 NOTE — Telephone Encounter (Signed)
Paul Humphrey is calling to request a refill on   Expand All Collapse All   sucralfate (CARAFATE) 1 GM/10ML suspension

## 2015-03-13 NOTE — Telephone Encounter (Signed)
Spoke with pt, advised message from Sarah. Pt understood. 

## 2015-03-13 NOTE — Telephone Encounter (Signed)
Can we refill this? 

## 2015-03-13 NOTE — Telephone Encounter (Signed)
If pt is still having problems and feels like he needs Carafte he will need an OV

## 2015-03-18 ENCOUNTER — Ambulatory Visit (INDEPENDENT_AMBULATORY_CARE_PROVIDER_SITE_OTHER): Payer: BLUE CROSS/BLUE SHIELD | Admitting: Physician Assistant

## 2015-03-18 VITALS — BP 130/74 | HR 70 | Temp 98.3°F | Resp 16 | Ht 73.0 in | Wt 234.4 lb

## 2015-03-18 DIAGNOSIS — K219 Gastro-esophageal reflux disease without esophagitis: Secondary | ICD-10-CM

## 2015-03-18 DIAGNOSIS — Z23 Encounter for immunization: Secondary | ICD-10-CM

## 2015-03-18 MED ORDER — SUCRALFATE 1 GM/10ML PO SUSP: 1.0000 g | Freq: Two times a day (BID) | ORAL | Status: DC

## 2015-03-18 NOTE — Progress Notes (Signed)
   Subjective:    Patient ID: Paul Humphrey, male    DOB: Jul 31, 1986, 29 y.o.   MRN: [id number]  Chief Complaint  Patient presents with  . Immunizations    TDAP  . Medication Refill    Carafate   Patient Active Problem List   Diagnosis Date Noted  . GERD (gastroesophageal reflux disease) 03/18/2015  . Abdominal pain 01/07/2015  . Tingling 01/07/2015  . Suspected exposure to heavy metals 01/07/2015  . Vitamin D deficiency 01/07/2015   Medications, allergies, past medical history, surgical history, family history, social history and problem list reviewed and updated.  HPI  29 yom presentswith above issues.   Would like tdap today as has been 10 yrs since received and he works in Occupational hygienist.   Needs carafate refilled - has hx gerd. Has tried ppis and ranitidine in past but couldn't handle side effects. Has had upper endoscopy which showed no ulcer but "couple inflamed areas." He is taking twice daily. He follows strict reflux measures at home to help avoid sx.   Review of Systems No fevers, chills.     Objective:   Physical Exam  Constitutional: He is oriented to person, place, and time. He appears well-developed and well-nourished.  Non-toxic appearance. He does not have a sickly appearance. He does not appear ill. No distress.  BP 130/74 mmHg  Pulse 70  Temp(Src) 98.3 F (36.8 C) (Oral)  Resp 16  Ht 6\' 1"  (1.854 m)  Wt 234 lb 6.4 oz (106.323 kg)  BMI 30.93 kg/m2  SpO2 100%   Neurological: He is alert and oriented to person, place, and time.  Psychiatric: He has a normal mood and affect. His speech is normal and behavior is normal.      Assessment & Plan:   Need for Tdap vaccination - Plan: Tdap vaccine greater than or equal to 7yo IM  Gastroesophageal reflux disease, esophagitis presence not specified - Plan: sucralfate (CARAFATE) 1 GM/10ML suspension  Julieta Gutting, PA-C Physician Assistant-Certified [facility]  03/18/2015 10:24 AM

## 2015-03-18 NOTE — Patient Instructions (Signed)
I've refilled the carafate for you. Continue to take twice daily. You received the tdap vaccine today.   Tdap Vaccine (Tetanus, Diphtheria, Pertussis): What You Need to Know 1. Why get vaccinated? Tetanus, diphtheria and pertussis can be very serious diseases, even for adolescents and adults. Tdap vaccine can protect [place] from these diseases. TETANUS (Lockjaw) causes painful muscle tightening and stiffness, usually all over the body.  It can lead to tightening of muscles in the head and neck so you can't open your mouth, swallow, or sometimes even breathe. Tetanus kills about 1 out of 5 people who are infected. DIPHTHERIA can cause a thick coating to form in the back of the throat.  It can lead to breathing problems, paralysis, heart failure, and death. PERTUSSIS (Whooping Cough) causes severe coughing spells, which can cause difficulty breathing, vomiting and disturbed sleep.  It can also lead to weight loss, incontinence, and rib fractures. Up to 2 in 100 adolescents and 5 in 100 adults with pertussis are hospitalized or have complications, which could include pneumonia or death. These diseases are caused by bacteria. Diphtheria and pertussis are spread from person to person through coughing or sneezing. Tetanus enters the body through cuts, scratches, or wounds. Before vaccines, the [place] saw as many as 200,000 cases a year of diphtheria and pertussis, and hundreds of cases of tetanus. Since vaccination began, tetanus and diphtheria have dropped by about 99% and pertussis by about 80%. 2. Tdap vaccine Tdap vaccine can protect adolescents and adults from tetanus, diphtheria, and pertussis. One dose of Tdap is routinely given at age 53 or 56. People who did not get Tdap at that age should get it as soon as possible. Tdap is especially important for health care professionals and anyone having close contact with a baby younger than 12 months. Pregnant women should get a dose of Tdap during  every pregnancy, to protect the newborn from pertussis. Infants are most at risk for severe, life-threatening complications from pertussis. A similar vaccine, called Td, protects from tetanus and diphtheria, but not pertussis. A Td booster should be given every 10 years. Tdap may be given as one of these boosters if you have not already gotten a dose. Tdap may also be given after a severe cut or burn to prevent tetanus infection. Your doctor can give you more information. Tdap may safely be given at the same time as other vaccines. 3. Some people should not get this vaccine  If you ever had a life-threatening allergic reaction after a dose of any tetanus, diphtheria, or pertussis containing vaccine, OR if you have a severe allergy to any part of this vaccine, you should not get Tdap. Tell your doctor if you have any severe allergies.  If you had a coma, or long or multiple seizures within 7 days after a childhood dose of DTP or DTaP, you should not get Tdap, unless a cause other than the vaccine was found. You can still get Td.  Talk to your doctor if you:  have epilepsy or another nervous system problem,  had severe pain or swelling after any vaccine containing diphtheria, tetanus or pertussis,  ever had Guillain-Barr Syndrome (GBS),  aren't feeling well on the day the shot is scheduled. 4. Risks of a vaccine reaction With any medicine, including vaccines, there is a chance of side effects. These are usually mild and go away on their own, but serious reactions are also possible. Brief fainting spells can follow a vaccination, leading to injuries from  falling. Sitting or lying down for about 15 minutes can help prevent these. Tell your doctor if you feel dizzy or light-headed, or have vision changes or ringing in the ears. Mild problems following Tdap (Did not interfere with activities)  Pain where the shot was given (about 3 in 4 adolescents or 2 in 3 adults)  Redness or swelling where  the shot was given (about 1 person in 5)  Mild fever of at least 100.53F (up to about 1 in 25 adolescents or 1 in 100 adults)  Headache (about 3 or 4 people in 10)  Tiredness (about 1 person in 3 or 4)  Nausea, vomiting, diarrhea, stomach ache (up to 1 in 4 adolescents or 1 in 10 adults)  Chills, body aches, sore joints, rash, swollen glands (uncommon) Moderate problems following Tdap (Interfered with activities, but did not require medical attention)  Pain where the shot was given (about 1 in 5 adolescents or 1 in 100 adults)  Redness or swelling where the shot was given (up to about 1 in 16 adolescents or 1 in 25 adults)  Fever over 102F (about 1 in 100 adolescents or 1 in 250 adults)  Headache (about 3 in 20 adolescents or 1 in 10 adults)  Nausea, vomiting, diarrhea, stomach ache (up to 1 or 3 people in 100)  Swelling of the entire arm where the shot was given (up to about 3 in 100). Severe problems following Tdap (Unable to perform usual activities; required medical attention)  Swelling, severe pain, bleeding and redness in the arm where the shot was given (rare). A severe allergic reaction could occur after any vaccine (estimated less than 1 in a million doses). 5. What if there is a serious reaction? What should I look for?  Look for anything that concerns you, such as signs of a severe allergic reaction, very high fever, or behavior changes. Signs of a severe allergic reaction can include hives, swelling of the face and throat, difficulty breathing, a fast heartbeat, dizziness, and weakness. These would start a few minutes to a few hours after the vaccination. What should I do?  If you think it is a severe allergic reaction or other emergency that can't wait, call 9-1-1 or get the person to the nearest hospital. Otherwise, call your doctor.  Afterward, the reaction should be reported to the "Vaccine Adverse Event Reporting System" (VAERS). Your doctor might file this  report, or you can do it yourself through the VAERS web site at www.vaers.SamedayNews.es, or by calling 814-137-3397. VAERS is only for reporting reactions. They do not give medical advice.  6. The National Vaccine Injury Compensation Program The [employer] Vaccine Injury Compensation Program (VICP) is a federal program that was created to compensate people who may have been injured by certain vaccines. Persons who believe they may have been injured by a vaccine can learn about the program and about filing a claim by calling 931-773-6175 or visiting the [employer] website at GoldCloset.com.ee. 7. How can I learn more?  Ask your doctor.  Call your local or state health department.  Contact the Centers for Disease Control and Prevention (CDC):  Call (343)630-1648 or visit CDC's website at http://hunter.com/. CDC Tdap Vaccine VIS (12/31/11) Document Released: 02/09/2012 Document Revised: 12/25/2013 Document Reviewed: 11/22/2013 ExitCare Patient Information 2015 [place], [place]. This information is not intended to replace advice given to you by your health care provider. Make sure you discuss any questions you have with your health care provider.

## 2015-04-22 ENCOUNTER — Telehealth: Payer: Self-pay

## 2015-04-22 DIAGNOSIS — K219 Gastro-esophageal reflux disease without esophagitis: Secondary | ICD-10-CM

## 2015-04-22 MED ORDER — SUCRALFATE 1 GM/10ML PO SUSP: 1.0000 g | Freq: Two times a day (BID) | ORAL | Status: DC

## 2015-04-22 NOTE — Telephone Encounter (Signed)
Pt called back, he would like a 90 day supply sent. Rx sent.

## 2015-04-22 NOTE — Telephone Encounter (Signed)
Left message for pt to call back  °

## 2015-04-22 NOTE — Telephone Encounter (Signed)
Pt requesting more than one month of his Carafate because his insurance is going up and he is going to change plans,   Best phone [phone]    [facility]

## 2015-04-29 ENCOUNTER — Ambulatory Visit (INDEPENDENT_AMBULATORY_CARE_PROVIDER_SITE_OTHER): Payer: BLUE CROSS/BLUE SHIELD | Admitting: Urgent Care

## 2015-04-29 VITALS — BP 124/62 | HR 100 | Temp 99.0°F | Resp 14 | Ht 73.0 in | Wt 242.0 lb

## 2015-04-29 DIAGNOSIS — R059 Cough, unspecified: Secondary | ICD-10-CM

## 2015-04-29 DIAGNOSIS — R05 Cough: Secondary | ICD-10-CM | POA: Diagnosis not present

## 2015-04-29 DIAGNOSIS — J029 Acute pharyngitis, unspecified: Secondary | ICD-10-CM

## 2015-04-29 DIAGNOSIS — J019 Acute sinusitis, unspecified: Secondary | ICD-10-CM

## 2015-04-29 MED ORDER — AMOXICILLIN 875 MG PO TABS: 875.0000 mg | ORAL_TABLET | Freq: Two times a day (BID) | ORAL | Status: DC

## 2015-04-29 MED ORDER — HYDROCODONE-HOMATROPINE 5-1.5 MG/5ML PO SYRP
5.0000 mL | ORAL_SOLUTION | Freq: Every evening | ORAL | Status: DC | PRN
Start: 2015-04-29 — End: 2015-09-02

## 2015-04-29 NOTE — Progress Notes (Signed)
    MRN: [id number] DOB: 08/19/1986  Subjective:   Paul Humphrey is a 29 y.o. male presenting for chief complaint of Nasal Congestion; Cough; and Sore Throat  Reports 1 week history of sinus congestion, sinus pain, ear fullness, sore throat and hoarseness, slight cough. Has tried Mucinex without any significant changes, Delsym is helping with throat and cough relief. Denies fever, itchy watery eyes, red eyes, ear pain, ear drainage, wheezing, shortness of breath, chest tightness and chest pain, nausea, vomiting and abdominal pain. Has not had any obvious sick contacts, teaches theater in college. Admits history of seasonal allergies, take Muratidine for this. Denies history of asthma. Denies smoking cigarettes or drinking alcohol. Denies any other aggravating or relieving factors, no other questions or concerns.  Paul Humphrey has a current medication list which includes the following prescription(s): dextromethorphan, guaifenesin, multiple vitamins-minerals, sucralfate, and vitamin d (ergocalciferol). Also is allergic to ivp dye; gadolinium derivatives; lactose intolerance (gi); and percocet.  Paul Humphrey  has a past medical history of Allergy. Also  has past surgical history that includes Colon surgery (07/23/2012); reverse colostomy (01/02/2013); and Cholecystectomy.  Objective:   Vitals: BP 124/62 mmHg  Pulse 100  Temp(Src) 99 F (37.2 C) (Oral)  Resp 14  Ht 6\' 1"  (1.854 m)  Wt 242 lb (109.77 kg)  BMI 31.93 kg/m2  SpO2 98%  Physical Exam  Constitutional: He is oriented to person, place, and time. He appears well-developed and well-nourished.  HENT:  TM's intact bilaterally, no effusions or erythema. Nasal turbinates inflamed, erythematous and swollen with thick white mucus. Nasal passages minimally patent. Frontal and bilateral maxillary sinus tenderness. Throat without oropharyngeal exudates, erythema or abscesses.  Cardiovascular: Normal rate, regular rhythm and intact distal pulses.  Exam reveals  no gallop and no friction rub.   No murmur heard. Pulmonary/Chest: No respiratory distress. He has no wheezes. He has no rales.  Lymphadenopathy:    He has cervical adenopathy (right side, anterior).  Neurological: He is alert and oriented to person, place, and time.  Skin: Skin is warm and dry. No rash noted. No erythema. No pallor.   Assessment and Plan :   1. Acute sinusitis, recurrence not specified, unspecified location 2. Cough 3. Sore throat - We'll cover empirically for bacterial infection with amoxicillin x10 days. Offered patient Hycodan for his sore throat, counseled that it has hydrocodone in it patient said that he will try it and stop if he starts to have an adverse reaction. Patient can use Delsym for cough and sore throat during the day, advised rest and hydration. - Return to clinic in one week if symptoms don't improve or sooner if they significantly worsened despite antibiotic course.  Jaynee Eagles, PA-C [facility] [phone] 04/29/2015 10:41 AM

## 2015-04-29 NOTE — Progress Notes (Deleted)
    MRN: [id number] DOB: Dec 24, 1985  Subjective:   Paul Humphrey is a 29 y.o. male presenting for chief complaint of Nasal Congestion; Cough; and Sore Throat  Reports ***. Has *** tried *** relief. Denies ***. *** smoking, *** alcohol. Denies any other aggravating or relieving factors, no other questions or concerns.  Paul Humphrey has a current medication list which includes the following prescription(s): dextromethorphan, guaifenesin, multiple vitamins-minerals, sucralfate, and vitamin d (ergocalciferol). Also is allergic to ivp dye; gadolinium derivatives; lactose intolerance (gi); and percocet.  Paul Humphrey  has a past medical history of Allergy. Also  has past surgical history that includes Colon surgery (07/23/2012); reverse colostomy (01/02/2013); and Cholecystectomy.  Objective:   Vitals: BP 124/62 mmHg  Pulse 100  Temp(Src) 99 F (37.2 C) (Oral)  Resp 14  Ht 6\' 1"  (1.854 m)  Wt 242 lb (109.77 kg)  BMI 31.93 kg/m2  SpO2 98%  Physical Exam  No results found for this or any previous visit (from the past 24 hour(s)).  Assessment and Plan :     Jaynee Eagles, PA-C [facility] [phone] 04/29/2015 10:40 AM

## 2015-04-29 NOTE — Patient Instructions (Signed)

## 2015-05-02 ENCOUNTER — Ambulatory Visit (INDEPENDENT_AMBULATORY_CARE_PROVIDER_SITE_OTHER): Payer: BLUE CROSS/BLUE SHIELD | Admitting: Emergency Medicine

## 2015-05-02 VITALS — BP 118/74 | HR 89 | Temp 98.2°F | Resp 20 | Ht 73.0 in | Wt 243.0 lb

## 2015-05-02 DIAGNOSIS — J209 Acute bronchitis, unspecified: Secondary | ICD-10-CM

## 2015-05-02 DIAGNOSIS — J0140 Acute pansinusitis, unspecified: Secondary | ICD-10-CM | POA: Diagnosis not present

## 2015-05-02 MED ORDER — PSEUDOEPHEDRINE-GUAIFENESIN ER 60-600 MG PO TB12: 1.0000 | ORAL_TABLET | Freq: Two times a day (BID) | ORAL | Status: DC

## 2015-05-02 MED ORDER — HYDROCOD POLST-CPM POLST ER 10-8 MG/5ML PO SUER: 5.0000 mL | Freq: Two times a day (BID) | ORAL | Status: DC

## 2015-05-02 MED ORDER — AMOXICILLIN-POT CLAVULANATE 875-125 MG PO TABS: 1.0000 | ORAL_TABLET | Freq: Two times a day (BID) | ORAL | Status: DC

## 2015-05-02 NOTE — Progress Notes (Signed)
Subjective:  Patient ID: Paul Humphrey, male    DOB: 1986-03-11  Age: 29 y.o. MRN: [id number]  CC: Fever; Shortness of Breath; Cough; Fall; and Exposed to chemicals   HPI Paul Humphrey presents  with nasal congestion postnasal drainage that is purulent collar he also has a cough productive of purulent sputum. There is no wheezing or shortness of breath. He has a fever to 101.2. He has no nausea or vomiting. He is hoarse and has a sore throat. He's had no improvement with over-the-counter medication. He was seen 2 days ago and treated with amoxicillin with no improvement.  History Paul Humphrey has a past medical history of Allergy.   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   His  family history includes Arthritis in his father and mother; Breast cancer in his mother; Healthy in his brother.  He   reports that he has never smoked. He has never used smokeless tobacco. He reports that he does not drink alcohol or use illicit drugs.  Outpatient Prescriptions Prior to Visit  Medication Sig Dispense Refill  . amoxicillin (AMOXIL) 875 MG tablet Take 1 tablet (875 mg total) by mouth 2 (two) times daily. 20 tablet 0  . dextromethorphan (DELSYM) 30 MG/5ML liquid Take 30 mg by mouth as needed for cough.    Marland Kitchen guaiFENesin (MUCINEX) 600 MG 12 hr tablet Take 600 mg by mouth 2 (two) times daily.    Marland Kitchen HYDROcodone-homatropine (HYCODAN) 5-1.5 MG/5ML syrup Take 5 mLs by mouth at bedtime as needed. 120 mL 0  . Multiple Vitamins-Minerals (MULTIVITAMIN ADULT PO) Take by mouth.    . sucralfate (CARAFATE) 1 GM/10ML suspension Take 10 mLs (1 g total) by mouth 2 (two) times daily. 1260 mL 0  . Vitamin D, Ergocalciferol, (DRISDOL) 50000 UNITS CAPS capsule      No facility-administered medications prior to visit.    Social History   Social History  . Marital Status: Single    Spouse Name: N/A  . Number of Children: N/A  . Years of  Education: N/A   Social History Main Topics  . Smoking status: Never Smoker   . Smokeless tobacco: Never Used  . Alcohol Use: No  . Drug Use: No  . Sexual Activity: Not Asked   Other Topics Concern  . None   Social History Narrative   Lives with parents in a 2 story home.     Has no children.    Works as a Pharmacist, hospital, looking for a job here.    Highest level of education:  MFA     Review of Systems  Constitutional: Negative for fever, chills and appetite change.  HENT: Positive for sinus pressure and sore throat. Negative for ear pain and postnasal drip.   Eyes: Negative for pain and redness.  Respiratory: Positive for cough. Negative for shortness of breath and wheezing.   Cardiovascular: Negative for leg swelling.  Gastrointestinal: Negative for nausea, vomiting, abdominal pain, diarrhea, constipation and blood in stool.  Endocrine: Negative for polyuria.  Genitourinary: Negative for dysuria, urgency, frequency and flank pain.  Musculoskeletal: Negative for gait problem.  Skin: Negative for rash.  Neurological: Negative for weakness and headaches.  Psychiatric/Behavioral: Negative for confusion and decreased concentration. The patient is not nervous/anxious.     Objective:  BP 118/74 mmHg  Pulse 89  Temp(Src) 98.2 F (36.8 C) (Oral)  Resp 20  Ht 6\' 1"  (1.854 m)  Wt 243 lb (110.224 kg)  BMI  32.07 kg/m2  SpO2 98%  Physical Exam  Constitutional: He is oriented to person, place, and time. He appears well-developed and well-nourished. No distress.  HENT:  Head: Normocephalic and atraumatic.  Right Ear: External ear normal.  Left Ear: External ear normal.  Nose: Nose normal.  Eyes: Conjunctivae and EOM are normal. Pupils are equal, round, and reactive to light. No scleral icterus.  Neck: Normal range of motion. Neck supple. No tracheal deviation present.  Cardiovascular: Normal rate, regular rhythm and normal heart sounds.   Pulmonary/Chest: Effort normal. No  respiratory distress. He has no wheezes. He has no rales.  Abdominal: He exhibits no mass. There is no tenderness. There is no rebound and no guarding.  Musculoskeletal: He exhibits no edema.  Lymphadenopathy:    He has no cervical adenopathy.  Neurological: He is alert and oriented to person, place, and time. Coordination normal.  Skin: Skin is warm and dry. No rash noted.  Psychiatric: He has a normal mood and affect. His behavior is normal.      Assessment & Plan:   Paul Humphrey was seen today for fever, shortness of breath, cough, fall and exposed to chemicals.  Diagnoses and all orders for this visit:  Acute bronchitis, unspecified organism  Acute pansinusitis, recurrence not specified  Other orders -     amoxicillin-clavulanate (AUGMENTIN) 875-125 MG per tablet; Take 1 tablet by mouth 2 (two) times daily. -     chlorpheniramine-HYDROcodone (TUSSIONEX PENNKINETIC ER) 10-8 MG/5ML SUER; Take 5 mLs by mouth 2 (two) times daily. -     pseudoephedrine-guaifenesin (MUCINEX D) 60-600 MG per tablet; Take 1 tablet by mouth every 12 (twelve) hours.  I have discontinued Mr. Roback Vitamin D (Ergocalciferol). I am also having him start on amoxicillin-clavulanate, chlorpheniramine-HYDROcodone, and pseudoephedrine-guaifenesin. Additionally, I am having him maintain his Multiple Vitamins-Minerals (MULTIVITAMIN ADULT PO), sucralfate, guaiFENesin, dextromethorphan, amoxicillin, HYDROcodone-homatropine, and cholecalciferol.  Meds ordered this encounter  Medications  . cholecalciferol (VITAMIN D) 1000 UNITS tablet    Sig: Take 1,000 Units by mouth daily.  Marland Kitchen amoxicillin-clavulanate (AUGMENTIN) 875-125 MG per tablet    Sig: Take 1 tablet by mouth 2 (two) times daily.    Dispense:  20 tablet    Refill:  0  . chlorpheniramine-HYDROcodone (TUSSIONEX PENNKINETIC ER) 10-8 MG/5ML SUER    Sig: Take 5 mLs by mouth 2 (two) times daily.    Dispense:  60 mL    Refill:  0  . pseudoephedrine-guaifenesin  (MUCINEX D) 60-600 MG per tablet    Sig: Take 1 tablet by mouth every 12 (twelve) hours.    Dispense:  18 tablet    Refill:  0    Appropriate red flag conditions were discussed with the patient as well as actions that should be taken.  Patient expressed his understanding.  Follow-up: Return if symptoms worsen or fail to improve.  Roselee Culver, MD

## 2015-05-02 NOTE — Patient Instructions (Signed)

## 2015-05-23 ENCOUNTER — Telehealth: Payer: Self-pay

## 2015-05-23 MED ORDER — SUCRALFATE 1 G PO TABS: 1.0000 g | ORAL_TABLET | Freq: Two times a day (BID) | ORAL | Status: DC

## 2015-05-23 NOTE — Telephone Encounter (Signed)
PA was needed on the suspension form of carafate, but dissolvable tablets are covered. Gave VO to change to equivalent dose and #/RFs as orig Rx.

## 2015-05-28 ENCOUNTER — Encounter: Payer: Self-pay | Admitting: Emergency Medicine

## 2015-06-29 ENCOUNTER — Ambulatory Visit (INDEPENDENT_AMBULATORY_CARE_PROVIDER_SITE_OTHER): Payer: BLUE CROSS/BLUE SHIELD | Admitting: Family Medicine

## 2015-06-29 VITALS — BP 132/80 | HR 97 | Temp 98.2°F | Resp 20 | Ht 75.0 in | Wt 249.2 lb

## 2015-06-29 DIAGNOSIS — J329 Chronic sinusitis, unspecified: Secondary | ICD-10-CM

## 2015-06-29 DIAGNOSIS — J029 Acute pharyngitis, unspecified: Secondary | ICD-10-CM | POA: Diagnosis not present

## 2015-06-29 DIAGNOSIS — S238XXA Sprain of other specified parts of thorax, initial encounter: Secondary | ICD-10-CM

## 2015-06-29 NOTE — Progress Notes (Signed)
Subjective:  This chart was scribed for Paul Ray, MD by Thea Alken, ED Scribe. This patient was seen in room 10 and the patient's care was started at 8:24 AM.   Patient ID: Paul Humphrey, male    DOB: 1986/06/04, 29 y.o.   MRN: [id number]  HPI Chief Complaint  Patient presents with  . Sore Throat    4 days   . OTHER    pulled shoulders    HPI Comments: Paul Humphrey is a 30 y.o. male who presents to the [facility] complaining of a sore throat that began 4 days ago with an associated dry cough. He was seen by ENT 2 months ago and diagnosed with chronic sinusitis. He was started on augmentin for 2 weeks but was switched to clindamycin 2 weeks ago. Initially he was taking clindamycin 3 times a day but has been taking is twice a day for about a week now. He's had sick contacts at work. No fever.  He also has bilateral shoulder pain. He is a Licensed conveyancer at [employer]. States 3 weeks ago stunt walls fell on him. He was able to stop them but felt a pop in both shoulder when doing this. He initially has some soreness that resolved on its own. Within the past week pain has returned with intermittent, sharp, bilateral shoulder pain with certain movements and changing positions. He is still able to do daily routine but has some soreness. He is right hand dominant.      Patient Active Problem List   Diagnosis Date Noted  . GERD (gastroesophageal reflux disease) 03/18/2015  . Abdominal pain 01/07/2015  . Tingling 01/07/2015  . Suspected exposure to heavy metals 01/07/2015  . Vitamin D deficiency 01/07/2015   Past Medical History  Diagnosis Date  . Allergy    Past Surgical History  Procedure Laterality Date  . Colon surgery  07/23/2012    Secondary to perforation   . 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 Cholecystectomy     Allergies  Allergen Reactions  . Ivp Dye [Iodinated Diagnostic Agents] Nausea Only  . Gadolinium Derivatives Nausea And Vomiting  .  Lactose Intolerance (Gi) Nausea And Vomiting  . Percocet [Oxycodone-Acetaminophen] Other (See Comments)    Skin crawling/burning   Prior to Admission medications   Medication Sig Start Date End Date Taking? Authorizing Provider  amoxicillin-clavulanate (AUGMENTIN) 875-125 MG per tablet Take 1 tablet by mouth 2 (two) times daily. 05/02/15  Yes Roselee Culver, MD  chlorpheniramine-HYDROcodone [facility] PENNKINETIC ER) 10-8 MG/5ML SUER Take 5 mLs by mouth 2 (two) times daily. 05/02/15  Yes Roselee Culver, MD  cholecalciferol (VITAMIN D) 1000 UNITS tablet Take 1,000 Units by mouth daily.   Yes Historical Provider, MD  clindamycin (CLEOCIN) 300 MG capsule Take 300 mg by mouth 3 (three) times daily.   Yes Historical Provider, MD  dextromethorphan (DELSYM) 30 MG/5ML liquid Take 30 mg by mouth as needed for cough.   Yes Historical Provider, MD  guaiFENesin (MUCINEX) 600 MG 12 hr tablet Take 600 mg by mouth 2 (two) times daily.   Yes Historical Provider, MD  HYDROcodone-homatropine (HYCODAN) 5-1.5 MG/5ML syrup Take 5 mLs by mouth at bedtime as needed. 04/29/15  Yes Jaynee Eagles, PA-C  Multiple Vitamins-Minerals (MULTIVITAMIN ADULT PO) Take by mouth.   Yes Historical Provider, MD  pseudoephedrine-guaifenesin (MUCINEX D) 60-600 MG per tablet Take 1 tablet by mouth every 12 (twelve) hours. 05/02/15 05/01/16 Yes Roselee Culver, MD  sucralfate ([place])  1 G tablet Take 1 tablet (1 g total) by mouth 2 (two) times daily. 05/23/15  Yes Darlyne Russian, MD  amoxicillin (AMOXIL) 875 MG tablet Take 1 tablet (875 mg total) by mouth 2 (two) times daily. Patient not taking: Reported on 06/29/2015 04/29/15   Jaynee Eagles, PA-C   Social History   Social History  . Marital Status: Single    Spouse Name: N/A  . Number of Children: N/A  . Years of Education: N/A   Occupational History  . Not on file.   Social History Main Topics  . Smoking status: Never Smoker   . Smokeless tobacco: Never Used  . Alcohol Use: No  .  Drug Use: No  . Sexual Activity: Not on file   Other Topics Concern  . Not on file   Social History Narrative   Lives with parents in a 2 story home.     Has no children.    Works as a Pharmacist, hospital, looking for a job here.    Highest level of education:  MFA    Review of Systems  Constitutional: Negative for fever and chills.  HENT: Positive for sore throat.   Respiratory: Positive for cough.   Musculoskeletal: Positive for myalgias.  Neurological: Negative for weakness and numbness.       Objective:   Physical Exam  Constitutional: He is oriented to person, place, and time. He appears well-developed and well-nourished.  HENT:  Head: Normocephalic and atraumatic.  Right Ear: Tympanic membrane, external ear and ear canal normal.  Left Ear: Tympanic membrane, external ear and ear canal normal.  Nose: No rhinorrhea. Right sinus exhibits frontal sinus tenderness ( minimal). Left sinus exhibits frontal sinus tenderness.  Mouth/Throat: Oropharynx is clear and moist and mucous membranes are normal. No oropharyngeal exudate or posterior oropharyngeal erythema.  Minimal frontal sinus tenderness  Eyes: Conjunctivae are normal. Pupils are equal, round, and reactive to light.  Neck: Neck supple.  Cardiovascular: Normal rate, regular rhythm, normal heart sounds and intact distal pulses.   No murmur heard. Pulmonary/Chest: Effort normal and breath sounds normal. He has no wheezes. He has no rhonchi. He has no rales.  Abdominal: Soft. There is no tenderness.  Musculoskeletal:  c-spine: Full ROM. discomfort to upper back with extension.  Bilateral shoulders: Pratt, AC and clavicle non tender. Full ROM. Full rotator cuff strength. Slight discomfort with empty can. Negative near. Negative hawkins. Reproduction of discomfort over rhomboid.   Lymphadenopathy:    He has no cervical adenopathy.  Neurological: He is alert and oriented to person, place, and time.  Skin: Skin is warm and dry. No rash  noted.  Psychiatric: He has a normal mood and affect. His behavior is normal.  Vitals reviewed.  Filed Vitals:   06/29/15 0816  BP: 132/80  Pulse: 97  Temp: 98.2 F (36.8 C)  TempSrc: Oral  Resp: 20  Height: 6\' 3"  (1.905 m)  Weight: 249 lb 3.2 oz (113.036 kg)  SpO2: 98%   Assessment & Plan:   Saket Hellstrom is a 29 y.o. male Sprain of rhomboid, initial encounter  -no bony ttp, from and full rtc strength. xr deferred.  Sx care with otc nsaid for now, rom and heat as needed. Taught rhomboid stretch. rtc precautions.   Sore throat  -suspected viral URI.  Doubt strep, and already taking clindamycin. No apparent signs of thrush. Further testing deferred,  Sx care, rtc precautions.   Chronic sinusitis, unspecified location  -continue follow up with ENT, and  clindamycin as prescribed.    Meds ordered this encounter  Medications  . clindamycin (CLEOCIN) 300 MG capsule    Sig: Take 300 mg by mouth 3 (three) times daily.   Patient Instructions  Ibuprofen or alleve, heat, as well as stretches for upper back. If not improving in next week or two - return for xray.   Sore throat likely from virus. See info below.   Return to the clinic or go to the nearest emergency room if any of your symptoms worsen or new symptoms occur.  Upper Respiratory Infection, Adult Most upper respiratory infections (URIs) are a viral infection of the air passages leading to the lungs. A URI affects the nose, throat, and upper air passages. The most common type of URI is nasopharyngitis and is typically referred to as "the common cold." URIs run their course and usually go away on their own. Most of the time, a URI does not require medical attention, but sometimes a bacterial infection in the upper airways can follow a viral infection. This is called a secondary infection. Sinus and middle ear infections are common types of secondary upper respiratory infections. Bacterial pneumonia can also complicate a URI. A  URI can worsen asthma and chronic obstructive pulmonary disease (COPD). Sometimes, these complications can require emergency medical care and may be life threatening.  CAUSES Almost all URIs are caused by viruses. A virus is a type of germ and can spread from one person to another.  RISKS FACTORS You may be at risk for a URI if:   You smoke.   You have chronic heart or lung disease.  You have a weakened defense (immune) system.   You are very young or very old.   You have nasal allergies or asthma.  You work in crowded or poorly ventilated areas.  You work in health care facilities or schools. SIGNS AND SYMPTOMS  Symptoms typically develop 2-3 days after you come in contact with a cold virus. Most viral URIs last 7-10 days. However, viral URIs from the influenza virus (flu virus) can last 14-18 days and are typically more severe. Symptoms may include:   Runny or stuffy (congested) nose.   Sneezing.   Cough.   Sore throat.   Headache.   Fatigue.   Fever.   Loss of appetite.   Pain in your forehead, behind your eyes, and over your cheekbones (sinus pain).  Muscle aches.  DIAGNOSIS  Your health care provider may diagnose a URI by:  Physical exam.  Tests to check that your symptoms are not due to another condition such as:  Strep throat.  Sinusitis.  Pneumonia.  Asthma. TREATMENT  A URI goes away on its own with time. It cannot be cured with medicines, but medicines may be prescribed or recommended to relieve symptoms. Medicines may help:  Reduce your fever.  Reduce your cough.  Relieve nasal congestion. HOME CARE INSTRUCTIONS   Take medicines only as directed by your health care provider.   Gargle warm saltwater or take cough drops to comfort your throat as directed by your health care provider.  Use a warm mist humidifier or inhale steam from a shower to increase air moisture. This may make it easier to breathe.  Drink enough fluid to  keep your urine clear or pale yellow.   Eat soups and other clear broths and maintain good nutrition.   Rest as needed.   Return to work when your temperature has returned to normal or as your health  care provider advises. You may need to stay home longer to avoid infecting others. You can also use a face mask and careful hand washing to prevent spread of the virus.  Increase the usage of your inhaler if you have asthma.   Do not use any tobacco products, including cigarettes, chewing tobacco, or electronic cigarettes. If you need help quitting, ask your health care provider. PREVENTION  The best way to protect yourself from getting a cold is to practice good hygiene.   Avoid oral or hand contact with people with cold symptoms.   Wash your hands often if contact occurs.  There is no clear evidence that vitamin C, vitamin E, echinacea, or exercise reduces the chance of developing a cold. However, it is always recommended to get plenty of rest, exercise, and practice good nutrition.  SEEK MEDICAL CARE IF:   You are getting worse rather than better.   Your symptoms are not controlled by medicine.   You have chills.  You have worsening shortness of breath.  You have brown or red mucus.  You have yellow or brown nasal discharge.  You have pain in your face, especially when you bend forward.  You have a fever.  You have swollen neck glands.  You have pain while swallowing.  You have white areas in the back of your throat. SEEK IMMEDIATE MEDICAL CARE IF:   You have severe or persistent:  Headache.  Ear pain.  Sinus pain.  Chest pain.  You have chronic lung disease and any of the following:  Wheezing.  Prolonged cough.  Coughing up blood.  A change in your usual mucus.  You have a stiff neck.  You have changes in your:  Vision.  Hearing.  Thinking.  Mood. MAKE SURE YOU:   Understand these instructions.  Will watch your condition.  Will  get help right away if you are not doing well or get worse.   This information is not intended to replace advice given to you by your health care provider. Make sure you discuss any questions you have with your health care provider.   Document Released: 02/03/2001 Document Revised: 12/25/2014 Document Reviewed: 11/15/2013 Elsevier Interactive Patient Education 2016 Elsevier Inc.   Sore Throat A sore throat is pain, burning, irritation, or scratchiness of the throat. There is often pain or tenderness when swallowing or talking. A sore throat may be accompanied by other symptoms, such as coughing, sneezing, fever, and swollen neck glands. A sore throat is often the first sign of another sickness, such as a cold, flu, strep throat, or mononucleosis (commonly known as mono). Most sore throats go away without medical treatment. CAUSES  The most common causes of a sore throat include:  A viral infection, such as a cold, flu, or mono.  A bacterial infection, such as strep throat, tonsillitis, or whooping cough.  Seasonal allergies.  Dryness in the air.  Irritants, such as smoke or pollution.  Gastroesophageal reflux disease (GERD). HOME CARE INSTRUCTIONS   Only take over-the-counter medicines as directed by your caregiver.  Drink enough fluids to keep your urine clear or pale yellow.  Rest as needed.  Try using throat sprays, lozenges, or sucking on hard candy to ease any pain (if older than 4 years or as directed).  Sip warm liquids, such as broth, herbal tea, or warm water with honey to relieve pain temporarily. You may also eat or drink cold or frozen liquids such as frozen ice pops.  Gargle with salt water (  mix 1 tsp salt with 8 oz of water).  Do not smoke and avoid secondhand smoke.  Put a cool-mist humidifier in your bedroom at night to moisten the air. You can also turn on a hot shower and sit in the bathroom with the door closed for 5-10 minutes. SEEK IMMEDIATE MEDICAL CARE  IF:  You have difficulty breathing.  You are unable to swallow fluids, soft foods, or your saliva.  You have increased swelling in the throat.  Your sore throat does not get better in 7 days.  You have nausea and vomiting.  You have a fever or persistent symptoms for more than 2-3 days.  You have a fever and your symptoms suddenly get worse. MAKE SURE YOU:   Understand these instructions.  Will watch your condition.  Will get help right away if you are not doing well or get worse.   This information is not intended to replace advice given to you by your health care provider. Make sure you discuss any questions you have with your health care provider.   Document Released: 09/17/2004 Document Revised: 08/31/2014 Document Reviewed: 04/17/2012 Elsevier Interactive Patient Education [employer].      By signing my name below, I, Raven Small, attest that this documentation has been prepared under the direction and in the presence of Paul Ray, MD.  Electronically Signed: Thea Alken, ED Scribe. 06/29/2015. 8:41 AM.  I personally performed the services described in this documentation, which was scribed in my presence. The recorded information has been reviewed and considered, and addended by me as needed.

## 2015-06-29 NOTE — Patient Instructions (Signed)
Ibuprofen or alleve, heat, as well as stretches for upper back. If not improving in next week or two - return for xray.   Sore throat likely from virus. See info below.   Return to the clinic or go to the nearest emergency room if any of your symptoms worsen or new symptoms occur.  Upper Respiratory Infection, Adult Most upper respiratory infections (URIs) are a viral infection of the air passages leading to the lungs. A URI affects the nose, throat, and upper air passages. The most common type of URI is nasopharyngitis and is typically referred to as "the common cold." URIs run their course and usually go away on their own. Most of the time, a URI does not require medical attention, but sometimes a bacterial infection in the upper airways can follow a viral infection. This is called a secondary infection. Sinus and middle ear infections are common types of secondary upper respiratory infections. Bacterial pneumonia can also complicate a URI. A URI can worsen asthma and chronic obstructive pulmonary disease (COPD). Sometimes, these complications can require emergency medical care and may be life threatening.  CAUSES Almost all URIs are caused by viruses. A virus is a type of germ and can spread from one person to another.  RISKS FACTORS You may be at risk for a URI if:   You smoke.   You have chronic heart or lung disease.  You have a weakened defense (immune) system.   You are very young or very old.   You have nasal allergies or asthma.  You work in crowded or poorly ventilated areas.  You work in health care facilities or schools. SIGNS AND SYMPTOMS  Symptoms typically develop 2-3 days after you come in contact with a cold virus. Most viral URIs last 7-10 days. However, viral URIs from the influenza virus (flu virus) can last 14-18 days and are typically more severe. Symptoms may include:   Runny or stuffy (congested) nose.   Sneezing.   Cough.   Sore throat.    Headache.   Fatigue.   Fever.   Loss of appetite.   Pain in your forehead, behind your eyes, and over your cheekbones (sinus pain).  Muscle aches.  DIAGNOSIS  Your health care provider may diagnose a URI by:  Physical exam.  Tests to check that your symptoms are not due to another condition such as:  Strep throat.  Sinusitis.  Pneumonia.  Asthma. TREATMENT  A URI goes away on its own with time. It cannot be cured with medicines, but medicines may be prescribed or recommended to relieve symptoms. Medicines may help:  Reduce your fever.  Reduce your cough.  Relieve nasal congestion. HOME CARE INSTRUCTIONS   Take medicines only as directed by your health care provider.   Gargle warm saltwater or take cough drops to comfort your throat as directed by your health care provider.  Use a warm mist humidifier or inhale steam from a shower to increase air moisture. This may make it easier to breathe.  Drink enough fluid to keep your urine clear or pale yellow.   Eat soups and other clear broths and maintain good nutrition.   Rest as needed.   Return to work when your temperature has returned to normal or as your health care provider advises. You may need to stay home longer to avoid infecting others. You can also use a face mask and careful hand washing to prevent spread of the virus.  Increase the usage of your inhaler  if you have asthma.   Do not use any tobacco products, including cigarettes, chewing tobacco, or electronic cigarettes. If you need help quitting, ask your health care provider. PREVENTION  The best way to protect yourself from getting a cold is to practice good hygiene.   Avoid oral or hand contact with people with cold symptoms.   Wash your hands often if contact occurs.  There is no clear evidence that vitamin C, vitamin E, echinacea, or exercise reduces the chance of developing a cold. However, it is always recommended to get plenty  of rest, exercise, and practice good nutrition.  SEEK MEDICAL CARE IF:   You are getting worse rather than better.   Your symptoms are not controlled by medicine.   You have chills.  You have worsening shortness of breath.  You have brown or red mucus.  You have yellow or brown nasal discharge.  You have pain in your face, especially when you bend forward.  You have a fever.  You have swollen neck glands.  You have pain while swallowing.  You have white areas in the back of your throat. SEEK IMMEDIATE MEDICAL CARE IF:   You have severe or persistent:  Headache.  Ear pain.  Sinus pain.  Chest pain.  You have chronic lung disease and any of the following:  Wheezing.  Prolonged cough.  Coughing up blood.  A change in your usual mucus.  You have a stiff neck.  You have changes in your:  Vision.  Hearing.  Thinking.  Mood. MAKE SURE YOU:   Understand these instructions.  Will watch your condition.  Will get help right away if you are not doing well or get worse.   This information is not intended to replace advice given to you by your health care provider. Make sure you discuss any questions you have with your health care provider.   Document Released: 02/03/2001 Document Revised: 12/25/2014 Document Reviewed: 11/15/2013 Elsevier Interactive Patient Education 2016 [employer]   Sore Throat A sore throat is pain, burning, irritation, or scratchiness of the throat. There is often pain or tenderness when swallowing or talking. A sore throat may be accompanied by other symptoms, such as coughing, sneezing, fever, and swollen neck glands. A sore throat is often the first sign of another sickness, such as a cold, flu, strep throat, or mononucleosis (commonly known as mono). Most sore throats go away without medical treatment. CAUSES  The most common causes of a sore throat include:  A viral infection, such as a cold, flu, or mono.  A bacterial  infection, such as strep throat, tonsillitis, or whooping cough.  Seasonal allergies.  Dryness in the air.  Irritants, such as smoke or pollution.  Gastroesophageal reflux disease (GERD). HOME CARE INSTRUCTIONS   Only take over-the-counter medicines as directed by your caregiver.  Drink enough fluids to keep your urine clear or pale yellow.  Rest as needed.  Try using throat sprays, lozenges, or sucking on hard candy to ease any pain (if older than 4 years or as directed).  Sip warm liquids, such as broth, herbal tea, or warm water with honey to relieve pain temporarily. You may also eat or drink cold or frozen liquids such as frozen ice pops.  Gargle with salt water (mix 1 tsp salt with 8 oz of water).  Do not smoke and avoid secondhand smoke.  Put a cool-mist humidifier in your bedroom at night to moisten the air. You can also turn on a  hot shower and sit in the bathroom with the door closed for 5-10 minutes. SEEK IMMEDIATE MEDICAL CARE IF:  You have difficulty breathing.  You are unable to swallow fluids, soft foods, or your saliva.  You have increased swelling in the throat.  Your sore throat does not get better in 7 days.  You have nausea and vomiting.  You have a fever or persistent symptoms for more than 2-3 days.  You have a fever and your symptoms suddenly get worse. MAKE SURE YOU:   Understand these instructions.  Will watch your condition.  Will get help right away if you are not doing well or get worse.   This information is not intended to replace advice given to you by your health care provider. Make sure you discuss any questions you have with your health care provider.   Document Released: 09/17/2004 Document Revised: 08/31/2014 Document Reviewed: 04/17/2012 Elsevier Interactive Patient Education [employer].

## 2015-07-25 ENCOUNTER — Ambulatory Visit: Payer: BLUE CROSS/BLUE SHIELD | Admitting: Neurology

## 2015-09-02 ENCOUNTER — Ambulatory Visit (INDEPENDENT_AMBULATORY_CARE_PROVIDER_SITE_OTHER): Payer: BLUE CROSS/BLUE SHIELD

## 2015-09-02 ENCOUNTER — Encounter: Payer: Self-pay | Admitting: Podiatry

## 2015-09-02 ENCOUNTER — Ambulatory Visit (INDEPENDENT_AMBULATORY_CARE_PROVIDER_SITE_OTHER): Payer: BLUE CROSS/BLUE SHIELD | Admitting: Podiatry

## 2015-09-02 VITALS — BP 138/92 | HR 76 | Resp 16

## 2015-09-02 DIAGNOSIS — M779 Enthesopathy, unspecified: Secondary | ICD-10-CM

## 2015-09-02 DIAGNOSIS — B079 Viral wart, unspecified: Secondary | ICD-10-CM | POA: Diagnosis not present

## 2015-09-02 DIAGNOSIS — B078 Other viral warts: Secondary | ICD-10-CM

## 2015-09-02 DIAGNOSIS — M79672 Pain in left foot: Secondary | ICD-10-CM

## 2015-09-02 MED ORDER — TRIAMCINOLONE ACETONIDE 10 MG/ML IJ SUSP
10.0000 mg | Freq: Once | INTRAMUSCULAR | Status: AC
  Administered 2015-09-02: 10 mg

## 2015-09-02 NOTE — Progress Notes (Signed)
Subjective:     Patient ID: Paul Humphrey, male   DOB: [birth year], 30 y.o.   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 Injected the sinus tarsi left 3 mg Kenalog 5 mg Xylocaine and went ahead and debrided all lesions left and apply chemical agent to create a immune response in the area

## 2015-09-09 ENCOUNTER — Telehealth: Payer: Self-pay

## 2015-09-09 MED ORDER — SUCRALFATE 1 G PO TABS: 1.0000 g | ORAL_TABLET | Freq: Two times a day (BID) | ORAL | Status: DC

## 2015-09-09 NOTE — Telephone Encounter (Signed)
Pharm req'd RF. I OK'd 1 RF but advised pt needs to come discuss with Dr because hasn't been eval for this med for some time. Pharm agreed to advise pt.

## 2015-09-13 ENCOUNTER — Encounter: Payer: Self-pay | Admitting: Podiatry

## 2015-09-13 ENCOUNTER — Ambulatory Visit (INDEPENDENT_AMBULATORY_CARE_PROVIDER_SITE_OTHER): Payer: BLUE CROSS/BLUE SHIELD | Admitting: Podiatry

## 2015-09-13 DIAGNOSIS — B070 Plantar wart: Secondary | ICD-10-CM | POA: Diagnosis not present

## 2015-09-13 DIAGNOSIS — B079 Viral wart, unspecified: Secondary | ICD-10-CM

## 2015-09-13 DIAGNOSIS — B078 Other viral warts: Secondary | ICD-10-CM

## 2015-09-13 NOTE — Progress Notes (Signed)
Subjective:     Patient ID: Paul Humphrey, male   DOB: 04-12-1986, 30 y.o.   MRN: [id number]  HPI patient states I'm still having lesions bottom of the left foot they seem somewhat better but still sore and my ankle feels a lot better   Review of Systems     Objective:   Physical Exam Neurovascular status intact muscle strength adequate with lesions plantar left that upon debridement show pinpoint bleeding and patient also noted to have discomfort of a minimal nature left ankle    Assessment:     Doing better from warts left with improved ankle function left    Plan:     Debrided all lesions applied chemical consisting of immune agent and sterile dressings and reappoint to recheck in 4 weeks

## 2015-09-19 ENCOUNTER — Encounter (HOSPITAL_COMMUNITY): Payer: Self-pay | Admitting: *Deleted

## 2015-09-19 ENCOUNTER — Emergency Department (INDEPENDENT_AMBULATORY_CARE_PROVIDER_SITE_OTHER)
Admission: EM | Admit: 2015-09-19 | Discharge: 2015-09-19 | Disposition: A | Discharge status: Home / Self Care | Payer: Worker's Compensation | Source: Home / Self Care

## 2015-09-19 ENCOUNTER — Emergency Department (INDEPENDENT_AMBULATORY_CARE_PROVIDER_SITE_OTHER): Payer: Worker's Compensation

## 2015-09-19 DIAGNOSIS — S9002XA Contusion of left ankle, initial encounter: Secondary | ICD-10-CM | POA: Diagnosis not present

## 2015-09-19 DIAGNOSIS — S8001XA Contusion of right knee, initial encounter: Secondary | ICD-10-CM

## 2015-09-19 NOTE — Discharge Instructions (Signed)
Ice, advil and ankle splint as needed, ok to work as scheduled, see your doctor if further problems.

## 2015-09-19 NOTE — ED Provider Notes (Signed)
CSN: [id number]     Arrival date & time 09/19/15  1740 History   None    Chief Complaint  Patient presents with  . Fall   (Consider location/radiation/quality/duration/timing/severity/associated sxs/prior Treatment) Patient is a 30 y.o. male presenting with fall. The history is provided by the patient.  Fall This is a new problem. 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 The problem has not changed since onset.   Past Medical History  Diagnosis Date  . Allergy    Past Surgical History  Procedure Laterality Date  . Colon surgery  07/23/2012    Secondary to perforation   . 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 Cholecystectomy     Family History  Problem Relation Age of Onset  . Breast cancer Mother     Living  . Arthritis Mother   . Arthritis Father   . Healthy Brother    Social History  Substance Use Topics  . Smoking status: Never Smoker   . Smokeless tobacco: Never Used  . Alcohol Use: No    Review of Systems  Constitutional: Negative.   Musculoskeletal: Negative for back pain, joint swelling and gait problem.  Skin: Positive for wound.  All other systems reviewed and are negative.   Allergies  Ivp dye; Gadolinium derivatives; Lactose intolerance (gi); and Percocet  Home Medications   Prior to Admission medications   Medication Sig Start Date End Date Taking? Authorizing Provider  cholecalciferol (VITAMIN D) 1000 UNITS tablet Take 1,000 Units by mouth daily.    Historical Provider, MD  Multiple Vitamins-Minerals (MULTIVITAMIN ADULT PO) Take by mouth.    Historical Provider, MD  sucralfate (CARAFATE) 1 g tablet Take 1 tablet (1 g total) by mouth 2 (two) times daily. 09/09/15   Darlyne Russian, MD   Meds Ordered and Administered this Visit  Medications - No data to display  BP 132/78 mmHg  Pulse 78  Temp(Src) 98.6 F (37 C) (Oral)   Resp 18  SpO2 99% No data found.   Physical Exam  Constitutional: He is oriented to person, place, and time. He appears well-developed and well-nourished.  Musculoskeletal: He exhibits tenderness.       Feet:  Neurological: He is alert and oriented to person, place, and time.  Skin: Skin is warm and dry. There is erythema.  Nursing note and vitals reviewed.   ED Course  Procedures (including critical care time)  Labs Review Labs Reviewed - No data to display  Imaging Review Dg Ankle Complete Left  09/19/2015  CLINICAL DATA:  Status post fall yesterday morning with a blow to the left ankle. Pain and limited range of motion. Initial encounter. EXAM: LEFT ANKLE COMPLETE - 3+ VIEW COMPARISON:  None. FINDINGS: There is no evidence of fracture, dislocation, or joint effusion. There is no evidence of arthropathy or other focal bone abnormality. Soft tissues are unremarkable. IMPRESSION: Negative exam. Electronically Signed   By: Inge Rise M.D.   On: 09/19/2015 19:45   X-rays reviewed and report per radiologist.   Visual Acuity Review  Right Eye Distance:   Left Eye Distance:   Bilateral Distance:    Right Eye Near:   Left Eye Near:    Bilateral Near:         MDM   1. Contusion, knee, right, initial encounter   2. Contusion, ankle, left, initial encounter  Billy Fischer, MD 09/19/15 2013

## 2015-09-19 NOTE — ED Notes (Signed)
Pt  Paul Humphrey  And      Injured  His   Injury     r       Knee   And  l    Ankle     He  Ambulated  To  Room   With  A  steady  Fluid  Gait         He     Reports      Pain in  The  Affected     Area           Pt  Ambulated  To      To  Room    With  A  Steady   Fluid gait         He  staes  He  Was  Told  By  His  Employer  To  [facility]  [facility]     For  eva[facility]     ot  Brought  No  Paperwork  With him

## 2015-10-02 ENCOUNTER — Ambulatory Visit: Payer: BLUE CROSS/BLUE SHIELD | Admitting: Podiatry

## 2015-10-10 ENCOUNTER — Ambulatory Visit (HOSPITAL_COMMUNITY)
Admission: RE | Admit: 2015-10-10 | Discharge: 2015-10-10 | Disposition: A | Discharge status: Home / Self Care | Payer: BLUE CROSS/BLUE SHIELD | Source: Ambulatory Visit | Attending: Family Medicine | Admitting: Family Medicine

## 2015-10-10 ENCOUNTER — Ambulatory Visit (INDEPENDENT_AMBULATORY_CARE_PROVIDER_SITE_OTHER): Payer: BLUE CROSS/BLUE SHIELD | Admitting: Family Medicine

## 2015-10-10 VITALS — BP 124/80 | HR 96 | Temp 98.2°F | Resp 18 | Ht 74.75 in | Wt 260.0 lb

## 2015-10-10 DIAGNOSIS — Z9049 Acquired absence of other specified parts of digestive tract: Secondary | ICD-10-CM | POA: Insufficient documentation

## 2015-10-10 DIAGNOSIS — R1031 Right lower quadrant pain: Secondary | ICD-10-CM

## 2015-10-10 DIAGNOSIS — N2882 Megaloureter: Secondary | ICD-10-CM | POA: Insufficient documentation

## 2015-10-10 DIAGNOSIS — R350 Frequency of micturition: Secondary | ICD-10-CM

## 2015-10-10 DIAGNOSIS — R1032 Left lower quadrant pain: Secondary | ICD-10-CM | POA: Insufficient documentation

## 2015-10-10 LAB — POCT URINALYSIS DIP (MANUAL ENTRY)
BILIRUBIN UA: NEGATIVE
BILIRUBIN UA: NEGATIVE
Blood, UA: NEGATIVE
GLUCOSE UA: NEGATIVE
LEUKOCYTES UA: NEGATIVE
NITRITE UA: NEGATIVE
Protein Ur, POC: NEGATIVE
Spec Grav, UA: 1.025
Urobilinogen, UA: 0.2
pH, UA: 5.5

## 2015-10-10 LAB — POCT CBC
Granulocyte percent: 78 %G (ref 37–80)
HEMATOCRIT: 47.5 % (ref 43.5–53.7)
Hemoglobin: 16.7 g/dL (ref 14.1–18.1)
LYMPH, POC: 1.1 (ref 0.6–3.4)
MCH, POC: 30.4 pg (ref 27–31.2)
MCHC: 35.2 g/dL (ref 31.8–35.4)
MCV: 86.2 fL (ref 80–97)
MID (CBC): 0.6 (ref 0–0.9)
MPV: 7.5 fL (ref 0–99.8)
POC GRANULOCYTE: 6.2 (ref 2–6.9)
POC LYMPH %: 14 % (ref 10–50)
POC MID %: 8 % (ref 0–12)
Platelet Count, POC: 151 10*3/uL (ref 142–424)
RBC: 5.51 M/uL (ref 4.69–6.13)
RDW, POC: 13.5 %
WBC: 8 10*3/uL (ref 4.6–10.2)

## 2015-10-10 LAB — POC MICROSCOPIC URINALYSIS (UMFC): Mucus: ABSENT

## 2015-10-10 LAB — GLUCOSE, POCT (MANUAL RESULT ENTRY): POC Glucose: 100 mg/dl — AB (ref 70–99)

## 2015-10-10 MED ORDER — CIPROFLOXACIN HCL 500 MG PO TABS: 500.0000 mg | ORAL_TABLET | Freq: Two times a day (BID) | ORAL | Status: DC

## 2015-10-10 NOTE — Patient Instructions (Addendum)
Go to [facility] for outpatient CT. Enter at main entrance and register at radiology.  I recommend you follow up with Dr. Benson Norway to discuss your recurrent lower abdominal pain to determine if other testing needed. Return to the clinic or go to the nearest emergency room if any of your symptoms worsen or new symptoms occur.  For current pain, this could be an early infection in the epididymis, bladder, or prostate, but your testing here overall is reassuring. With your history of intestinal surgery, and the location of pain, will arrange for a CAT scan today to make sure this does not look like diverticulitis. For now start Cipro 1 pill twice per day, and then further instructions based on results from CT scan.  Abdominal Pain, Adult Many things can cause abdominal pain. Usually, abdominal pain is not caused by a disease and will improve without treatment. It can often be observed and treated at home. Your health care provider will do a physical exam and possibly order blood tests and X-rays to help determine the seriousness of your pain. However, in many cases, more time must pass before a clear cause of the pain can be found. Before that point, your health care provider may not know if you need more testing or further treatment. HOME CARE INSTRUCTIONS Monitor your abdominal pain for any changes. The following actions may help to alleviate any discomfort you are experiencing:  Only take over-the-counter or prescription medicines as directed by your health care provider.  Do not take laxatives unless directed to do so by your health care provider.  Try a clear liquid diet (broth, tea, or water) as directed by your health care provider. Slowly move to a bland diet as tolerated. SEEK MEDICAL CARE IF:  You have unexplained abdominal pain.  You have abdominal pain associated with nausea or diarrhea.  You have pain when you urinate or have a bowel movement.  You experience abdominal pain  that wakes you in the night.  You have abdominal pain that is worsened or improved by eating food.  You have abdominal pain that is worsened with eating fatty foods.  You have a fever. SEEK IMMEDIATE MEDICAL CARE IF:  Your pain does not go away within 2 hours.  You keep throwing up (vomiting).  Your pain is felt only in portions of the abdomen, such as the right side or the left lower portion of the abdomen.  You pass bloody or black tarry stools. MAKE SURE YOU:  Understand these instructions.  Will watch your condition.  Will get help right away if you are not doing well or get worse.   This information is not intended to replace advice given to you by your health care provider. Make sure you discuss any questions you have with your health care provider.   Document Released: 05/20/2005 Document Revised: 05/01/2015 Document Reviewed: 04/19/2013 Elsevier Interactive Patient Education [employer].
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
## 2015-10-11 ENCOUNTER — Encounter: Payer: Self-pay | Admitting: Podiatry

## 2015-10-11 ENCOUNTER — Ambulatory Visit (INDEPENDENT_AMBULATORY_CARE_PROVIDER_SITE_OTHER): Payer: BLUE CROSS/BLUE SHIELD | Admitting: Podiatry

## 2015-10-11 ENCOUNTER — Telehealth: Payer: Self-pay

## 2015-10-11 VITALS — BP 126/98 | HR 86 | Resp 12

## 2015-10-11 DIAGNOSIS — B079 Viral wart, unspecified: Secondary | ICD-10-CM

## 2015-10-11 DIAGNOSIS — B070 Plantar wart: Secondary | ICD-10-CM | POA: Diagnosis not present

## 2015-10-11 DIAGNOSIS — B078 Other viral warts: Secondary | ICD-10-CM

## 2015-10-11 LAB — PSA: PSA: 0.82 ng/mL (ref <=4.00)

## 2015-10-11 NOTE — Telephone Encounter (Signed)
Read through CT impression to patient. He verbalized understanding. He was seen by Dr. Carlota Raspberry on 10/10/2015. I will route this message to Dr. Carlota Raspberry so that he can follow up with the patient.

## 2015-10-11 NOTE — Telephone Encounter (Signed)
Pt is looking for results of ct scan   Best number [phone]

## 2015-10-14 NOTE — Progress Notes (Signed)
Subjective:     Patient ID: Paul Humphrey, male   DOB: [birth year], 30 y.o.   MRN: [id number]  HPI patient states that I am improving but I do have some new lesions on my heel and they've not completely resolved   Review of Systems     Objective:   Physical Exam Neurovascular status intact with multiple keratotic lesions plantar aspect left forefoot left heel which are improving but still present    Assessment:     Verruca plantaris bilateral    Plan:     Debride lesions completely and applied chemical agent and sterile dressings. Reappoint to recheck

## 2016-01-18 ENCOUNTER — Ambulatory Visit (INDEPENDENT_AMBULATORY_CARE_PROVIDER_SITE_OTHER): Payer: BLUE CROSS/BLUE SHIELD | Admitting: Emergency Medicine

## 2016-01-18 VITALS — BP 124/82 | HR 90 | Temp 98.3°F | Resp 18 | Ht 74.75 in | Wt 268.2 lb

## 2016-01-18 DIAGNOSIS — L723 Sebaceous cyst: Secondary | ICD-10-CM

## 2016-01-18 NOTE — Patient Instructions (Addendum)
I have made a referral for you to have removal of the sebaceous cyst on his scrotum.    IF you received an x-ray today, you will receive an invoice from [facility]. Please contact [facility] at [phone] with questions or concerns regarding your invoice.   IF you received labwork today, you will receive an invoice from [employer]. Please contact [facility] at [phone] with questions or concerns regarding your invoice.   Our billing staff will not be able to assist you with questions regarding bills from these companies.  You will be contacted with the lab results as soon as they are available. The fastest way to get your results is to activate your My Chart account. Instructions are located on the last page of this paperwork. If you have not heard from [place] regarding the results in 2 weeks, please contact this office.

## 2016-01-18 NOTE — Progress Notes (Signed)
By signing my name below, I, [name], attest that this documentation has been prepared under the direction and in the presence of Nena Jordan, MD. Electronically Signed: [name], ER Scribe. 01/18/2016. 2:07 PM.  Chief Complaint:  Chief Complaint  Patient presents with  . Lump On Right Testicle    x3 years    HPI: Paul Humphrey is a 30 y.o. male who reports to [facility] today complaining of a white bump on the skin of his testicle. 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 He recently interviewed with a college in [place] for a position there. He has been seen in the past for the spot on his testicle, and was told it is a normal skin growth. He is reporting today because the bump has grown.   Past Medical History  Diagnosis Date  . Allergy    Past Surgical History  Procedure Laterality Date  . Colon surgery  07/23/2012    Secondary to perforation   . 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 Cholecystectomy     Social History   Social History  . Marital Status: Single    Spouse Name: N/A  . Number of Children: N/A  . Years of Education: N/A   Social History Main Topics  . Smoking status: Never Smoker   . Smokeless tobacco: Never Used  . Alcohol Use: No  . Drug Use: No  . Sexual Activity: Not Asked   Other Topics Concern  . None   Social History Narrative   Lives with parents in a 2 story home.     Has no children.    Works as a Pharmacist, hospital, looking for a job here.    Highest level of education:  MFA   Family History  Problem Relation Age of Onset  . Breast cancer Mother     Living  . Arthritis Mother   . Arthritis Father   . Healthy Brother    Allergies  Allergen Reactions  . Ivp Dye [Iodinated Diagnostic Agents] Nausea Only  . Gadolinium Derivatives Nausea And Vomiting  . Lactose Intolerance (Gi) Nausea And Vomiting  . Percocet [Oxycodone-Acetaminophen] Other (See Comments)    Skin crawling/burning   Prior to Admission medications     Medication Sig Start Date End Date Taking? Authorizing Provider  cholecalciferol (VITAMIN D) 1000 UNITS tablet Take 1,000 Units by mouth daily.   Yes Historical Provider, MD  sucralfate (CARAFATE) 1 g tablet Take 1 tablet (1 g total) by mouth 2 (two) times daily. 09/09/15  Yes Darlyne Russian, MD  Multiple Vitamins-Minerals (MULTIVITAMIN ADULT PO) Take by mouth. Reported on 01/18/2016    Historical Provider, MD     ROS: The patient denies fevers, chills, night sweats, unintentional weight loss, chest pain, palpitations, wheezing, dyspnea on exertion, nausea, vomiting, abdominal pain, dysuria, hematuria, melena, numbness, weakness, or tingling.   All other systems have been reviewed and were otherwise negative with the exception of those mentioned in the HPI and as above.    PHYSICAL EXAM: Filed Vitals:   01/18/16 1324  BP: 124/82  Pulse: 90  Temp: 98.3 F (36.8 C)  Resp: 18   Body mass index is 33.73 kg/(m^2).   General: Alert, no acute distress HEENT:  Normocephalic, atraumatic, oropharynx patent. Eye: Juliette Mangle [facility] Cardiovascular:  Regular rate and rhythm, no rubs murmurs or gallops.  No Carotid bruits, radial pulse intact. No pedal edema.  Respiratory: Clear to auscultation bilaterally.  No wheezes, rales, or rhonchi.  No cyanosis,  no use of accessory musculature Abdominal: No organomegaly, abdomen is soft and non-tender, positive bowel sounds.  No masses. Musculoskeletal: Gait intact. No edema, tenderness Skin: No rashes. 27mm sebaceous cyst on the underside of the scrotum on the right. There are no testicular masses. Neurologic: Facial musculature symmetric. Psychiatric: Patient acts appropriately throughout our interaction. Lymphatic: No cervical or submandibular lymphadenopathy   LABS:   EKG/XRAY:   Primary read interpreted by [facility]. Everlene Farrier at [facility].   ASSESSMENT/PLAN: Patient has a sebaceous cyst on his scrotum. Referred to dermatology for removal.   Gross sideeffects,  risk and benefits, and alternatives of medications d/w patient. Patient is aware that all medications have potential sideeffects and we are unable to predict every sideeffect or drug-drug interaction that may occur.  Arlyss Queen MD 01/18/2016 2:07 PM

## 2016-02-11 DIAGNOSIS — L720 Epidermal cyst: Secondary | ICD-10-CM | POA: Diagnosis not present

## 2016-03-24 ENCOUNTER — Ambulatory Visit (INDEPENDENT_AMBULATORY_CARE_PROVIDER_SITE_OTHER): Payer: BLUE CROSS/BLUE SHIELD | Admitting: Family Medicine

## 2016-03-24 VITALS — BP 130/78 | HR 81 | Temp 98.0°F | Resp 16 | Ht 74.25 in | Wt 262.4 lb

## 2016-03-24 DIAGNOSIS — Z13220 Encounter for screening for lipoid disorders: Secondary | ICD-10-CM | POA: Diagnosis not present

## 2016-03-24 DIAGNOSIS — Z131 Encounter for screening for diabetes mellitus: Secondary | ICD-10-CM | POA: Diagnosis not present

## 2016-03-24 DIAGNOSIS — Z113 Encounter for screening for infections with a predominantly sexual mode of transmission: Secondary | ICD-10-CM

## 2016-03-24 DIAGNOSIS — R7989 Other specified abnormal findings of blood chemistry: Secondary | ICD-10-CM

## 2016-03-24 DIAGNOSIS — Z0000 Encounter for general adult medical examination without abnormal findings: Secondary | ICD-10-CM | POA: Diagnosis not present

## 2016-03-24 DIAGNOSIS — E663 Overweight: Secondary | ICD-10-CM

## 2016-03-24 LAB — COMPLETE METABOLIC PANEL WITH GFR
ALT: 52 U/L — AB (ref 9–46)
AST: 32 U/L (ref 10–40)
Albumin: 4.9 g/dL (ref 3.6–5.1)
Alkaline Phosphatase: 38 U/L — ABNORMAL LOW (ref 40–115)
BILIRUBIN TOTAL: 0.9 mg/dL (ref 0.2–1.2)
BUN: 18 mg/dL (ref 7–25)
CO2: 23 mmol/L (ref 20–31)
CREATININE: 1.21 mg/dL (ref 0.60–1.35)
Calcium: 9.7 mg/dL (ref 8.6–10.3)
Chloride: 104 mmol/L (ref 98–110)
GFR, Est African American: >89 mL/min (ref >=60)
GFR, Est Non African American: 82 mL/min (ref >=60)
GLUCOSE: 86 mg/dL (ref 65–99)
Potassium: 4.6 mmol/L (ref 3.5–5.3)
SODIUM: 140 mmol/L (ref 135–146)
TOTAL PROTEIN: 7.5 g/dL (ref 6.1–8.1)

## 2016-03-24 LAB — LIPID PANEL
Cholesterol: 136 mg/dL (ref 125–200)
HDL: 32 mg/dL — ABNORMAL LOW (ref >=40)
LDL CALC: 74 mg/dL (ref <130)
Total CHOL/HDL Ratio: 4.3 Ratio (ref <=5.0)
Triglycerides: 148 mg/dL (ref <150)
VLDL: 30 mg/dL (ref <30)

## 2016-03-24 NOTE — Patient Instructions (Signed)
   IF you received an x-ray today, you will receive an invoice from [facility]. Please contact [facility] at [phone] with questions or concerns regarding your invoice.   IF you received labwork today, you will receive an invoice from [facility]. Please contact [facility] at [phone] with questions or concerns regarding your invoice.   Our billing staff will not be able to assist you with questions regarding bills from these companies.  You will be contacted with the lab results as soon as they are available. The fastest way to get your results is to activate your My Chart account. Instructions are located on the last page of this paperwork. If you have not heard from us regarding the results in 2 weeks, please contact this office.    Keeping you healthy  Get these tests  Blood pressure- Have your blood pressure checked once a year by your healthcare provider.  Normal blood pressure is 120/80.  Weight- Have your body mass index (BMI) calculated to screen for obesity.  BMI is a measure of body fat based on height and weight. You can also calculate your own BMI at www.nhlbisupport.com/bmi/.  Cholesterol- Have your cholesterol checked regularly starting at age 35, sooner may be necessary if you have diabetes, high blood pressure, if a family member developed heart diseases at an early age or if you smoke.   Chlamydia, HIV, and other sexual transmitted disease- Get screened each year until the age of 25 then within three months of each new sexual partner.  Diabetes- Have your blood sugar checked regularly if you have high blood pressure, high cholesterol, a family history of diabetes or if you are overweight.  Get these vaccines  Flu shot- Every fall.  Tetanus shot- Every 10 years.  Menactra- Single dose; prevents meningitis.  Take these steps  Don't smoke- If you do smoke, ask your healthcare provider about quitting. For tips on  how to quit, go to www.smokefree.gov or call 1-800-QUIT-NOW.  Be physically active- Exercise 5 days a week for at least 30 minutes.  If you are not already physically active start slow and gradually work up to 30 minutes of moderate physical activity.  Examples of moderate activity include walking briskly, mowing the yard, dancing, swimming bicycling, etc.  Eat a healthy diet- Eat a variety of healthy foods such as fruits, vegetables, low fat milk, low fat cheese, yogurt, lean meats, poultry, fish, beans, tofu, etc.  For more information on healthy eating, go to www.thenutritionsource.org  Drink alcohol in moderation- Limit alcohol intake two drinks or less a day.  Never drink and drive.  Dentist- Brush and floss teeth twice daily; visit your dentis twice a year.  Depression-Your emotional health is as important as your physical health.  If you're feeling down, losing interest in things you normally enjoy please talk with your healthcare provider.  Gun Safety- If you keep a gun in your home, keep it unloaded and with the safety lock on.  Bullets should be stored separately.  Helmet use- Always wear a helmet when riding a motorcycle, bicycle, rollerblading or skateboarding.  Safe sex- If you may be exposed to a sexually transmitted infection, use a condom  Seat belts- Seat bels can save your life; always wear one.  Smoke/Carbon Monoxide detectors- These detectors need to be installed on the appropriate level of your home.  Replace batteries at least once a year.  Skin Cancer- When out in the sun, cover up and use sunscreen SPF   15 or higher.  Violence- If anyone is threatening or hurting you, please tell your healthcare provider. 

## 2016-03-24 NOTE — Progress Notes (Signed)
Subjective:  By signing my name below, I, Paul Humphrey, attest that this documentation has been prepared under the direction and in the presence of Paul Ray, MD. Electronically Signed: Moises Humphrey, [place]. 03/24/2016 , 3:27 PM .  Patient was seen in Room 13 .   Patient ID: Paul Humphrey, male    DOB: 15-Mar-1986, 30 y.o.   MRN: [id number] Chief Complaint  Patient presents with  . Annual Exam   HPI Paul Humphrey is a 30 y.o. male Here for annual physical. He has history of GERD. He was last seen by me for abdominal pain in February. He had CT abd pelvis without any acute findings; treated for possible acute epididymitis; advised to followed up with GI if problems persisted. He is a Automotive engineer.   Vitamin D He has a history of low vitamin D.   Hand pain He also noticed some intermittent pain between his interdigital spaces over bilateral hands. He informs it lasts about an hour. He denies any specific change with activity. He denies any joint swelling.   Immunizations Immunization History  Administered Date(s) Administered  . Tdap 03/18/2015    Depression Depression screen [facility] 2/9 03/24/2016 01/18/2016 10/10/2015 06/29/2015 04/29/2015  Decreased Interest 0 0 0 0 0  Down, Depressed, Hopeless 0 0 0 0 0  PHQ - 2 Score 0 0 0 0 0    Vision  Visual Acuity Screening   Right eye Left eye Both eyes  Without correction:     With correction: 20/13 20/13 20/13     Dentist He last saw his dentist about 3 weeks ago for annual cleaning.   Exercise He goes to the gym 5 times a week.   Diet He eats breakfast daily. He denies drinking any carbonated beverages.   Surgical history 1992 tubes in ears 2002 left leg muscle compartment syndrome 2013 emergency partial colectomy due to perforation 2014 reverse colostomy 2016 cholecystectomy, and 2 umbilical hernia repairs   Family History Patient has family history of DM, heart disease (grandparents), arthritis, stroke (grandparents),  depression (grandparents and uncle).   STI Testing He's currently single. Last testing was done a year ago, which was normal.   Patient Active Problem List   Diagnosis Date Noted  . GERD (gastroesophageal reflux disease) 03/18/2015  . Abdominal pain 01/07/2015  . Tingling 01/07/2015  . Suspected exposure to heavy metals 01/07/2015  . Vitamin D deficiency 01/07/2015   Past Medical History:  Diagnosis Date  . Allergy    Past Surgical History:  Procedure Laterality Date  . CHOLECYSTECTOMY    . COLON SURGERY  07/23/2012   Secondary to perforation   . reverse colostomy  01/02/2013   Allergies  Allergen Reactions  . Ivp Dye [Iodinated Diagnostic Agents] Nausea Only  . Gadolinium Derivatives Nausea And Vomiting  . Lactose Intolerance (Gi) Nausea And Vomiting  . Percocet [Oxycodone-Acetaminophen] Other (See Comments)    Skin crawling/burning   Prior to Admission medications   Medication Sig Start Date End Date Taking? Authorizing Provider  cholecalciferol (VITAMIN D) 1000 UNITS tablet Take 1,000 Units by mouth daily.   Yes Historical Provider, MD  Multiple Vitamins-Minerals (MULTIVITAMIN ADULT PO) Take by mouth. Reported on 01/18/2016   Yes Historical Provider, MD  sucralfate (CARAFATE) 1 g tablet Take 1 tablet (1 g total) by mouth 2 (two) times daily. 09/09/15  Yes Darlyne Russian, MD   Social History   Social History  . Marital status: Single    Spouse name: N/A  . Number  of children: N/A  . Years of education: N/A   Occupational History  . Not on file.   Social History Main Topics  . Smoking status: Never Smoker  . Smokeless tobacco: Never Used  . Alcohol use No  . Drug use: No  . Sexual activity: Not on file   Other Topics Concern  . Not on file   Social History Narrative   Lives with parents in a 2 story home.     Has no children.    Works as a Pharmacist, hospital, looking for a job here.    Highest level of education:  MFA   Review of Systems 13 point ROS - positive for  hand pain    Objective:   Physical Exam  Constitutional: He is oriented to person, place, and time. He appears well-developed and well-nourished.  HENT:  Head: Normocephalic and atraumatic.  Right Ear: External ear normal.  Left Ear: External ear normal.  Mouth/Throat: Oropharynx is clear and moist.  Eyes: Conjunctivae and EOM are normal. Pupils are equal, round, and reactive to light.  Neck: Normal range of motion. Neck supple. No thyromegaly present.  Cardiovascular: Normal rate, regular rhythm, normal heart sounds and intact distal pulses.   Pulmonary/Chest: Effort normal and breath sounds normal. No respiratory distress. He has no wheezes.  Abdominal: Soft. He exhibits no distension. There is no tenderness.  Well healed scars  Musculoskeletal: Normal range of motion. He exhibits no edema or tenderness.  No swelling of the joints, full rom of his hands and finger joints, full rom of wrists  Lymphadenopathy:    He has no cervical adenopathy.  Neurological: He is alert and oriented to person, place, and time. He has normal reflexes.  Skin: Skin is warm and dry.  Psychiatric: He has a normal mood and affect. His behavior is normal.  Vitals reviewed.     Vitals:   03/24/16 1434  BP: 130/78  Pulse: 81  Resp: 16  Temp: 98 F (36.7 C)  TempSrc: Oral  SpO2: 98%  Weight: 262 lb 6.4 oz (119 kg)  Height: 6' 2.25" (1.886 m)   Body mass index is 33.46 kg/m.   Wt Readings from Last 3 Encounters:  03/24/16 262 lb 6.4 oz (119 kg)  01/18/16 268 lb 3.2 oz (121.7 kg)  10/10/15 260 lb (117.9 kg)      Assessment & Plan:   Paul Humphrey is a 30 y.o. male Annual physical exam  - -anticipatory guidance as below in AVS, screening labs above. Health maintenance items as above in HPI discussed/recommended as applicable.   Overweight, Screening for diabetes mellitus - Plan: COMPLETE METABOLIC PANEL WITH GFR  - Check glucose and CMP, then consider adding A1c if elevated. Diet and exercise  discussed for weight loss.  Low serum vitamin D - Plan: Vitamin D, 25-hydroxy repeated.   Routine screening for STI (sexually transmitted infection) - Plan: RPR, GC/Chlamydia Probe Amp, HIV antibody  Screening for hyperlipidemia - Plan: Lipid panel     No orders of the defined types were placed in this encounter.  Patient Instructions       IF you received an x-Humphrey today, you will receive an invoice from [facility]. Please contact [facility] at [phone] with questions or concerns regarding your invoice.   IF you received labwork today, you will receive an invoice from [employer]. Please contact [facility] at [phone] with questions or concerns regarding your invoice.   Our billing staff will not be able  to assist you with questions regarding bills from these companies.  You will be contacted with the lab results as soon as they are available. The fastest way to get your results is to activate your My Chart account. Instructions are located on the last page of this paperwork. If you have not heard from [place] regarding the results in 2 weeks, please contact this office.     Keeping you healthy  Get these tests  Humphrey pressure- Have your Humphrey pressure checked once a year by your healthcare provider.  Normal Humphrey pressure is 120/80.  Weight- Have your body mass index (BMI) calculated to screen for obesity.  BMI is a measure of body fat based on height and weight. You can also calculate your own BMI at GravelBags.it.  Cholesterol- Have your cholesterol checked regularly starting at age 43, sooner may be necessary if you have diabetes, high Humphrey pressure, if a family member developed heart diseases at an early age or if you smoke.   Chlamydia, HIV, and other sexual transmitted disease- Get screened each year until the age of 78 then within three months of each new sexual partner.  Diabetes- Have your Humphrey sugar  checked regularly if you have high Humphrey pressure, high cholesterol, a family history of diabetes or if you are overweight.  Get these vaccines  Flu shot- Every fall.  Tetanus shot- Every 10 years.  Menactra- Single dose; prevents meningitis.  Take these steps  Don't smoke- If you do smoke, ask your healthcare provider about quitting. For tips on how to quit, go to www.smokefree.gov or call 1-800-QUIT-NOW.  Be physically active- Exercise 5 days a week for at least 30 minutes.  If you are not already physically active start slow and gradually work up to 30 minutes of moderate physical activity.  Examples of moderate activity include walking briskly, mowing the yard, dancing, swimming bicycling, etc.  Eat a healthy diet- Eat a variety of healthy foods such as fruits, vegetables, low fat milk, low fat cheese, yogurt, lean meats, poultry, fish, beans, tofu, etc.  For more information on healthy eating, go to www.thenutritionsource.org  Drink alcohol in moderation- Limit alcohol intake two drinks or less a day.  Never drink and drive.  Dentist- Brush and floss teeth twice daily; visit your dentis twice a year.  Depression-Your emotional health is as important as your physical health.  If you're feeling down, losing interest in things you normally enjoy please talk with your healthcare provider.  Gun Safety- If you keep a gun in your home, keep it unloaded and with the safety lock on.  Bullets should be stored separately.  Helmet use- Always wear a helmet when riding a motorcycle, bicycle, rollerblading or skateboarding.  Safe sex- If you may be exposed to a sexually transmitted infection, use a condom  Seat belts- Seat bels can save your life; always wear one.  Smoke/Carbon Monoxide detectors- These detectors need to be installed on the appropriate level of your home.  Replace batteries at least once a year.  Skin Cancer- When out in the sun, cover up and use sunscreen SPF 15 or  higher.  Violence- If anyone is threatening or hurting you, please tell your healthcare provider.   I personally performed the services described in this documentation, which was scribed in my presence. The recorded information has been reviewed and considered, and addended by me as needed.   Signed,   Paul Ray, MD [facility].  03/24/16 7:07 PM

## 2016-03-25 LAB — GC/CHLAMYDIA PROBE AMP
CT Probe RNA: NOT DETECTED
GC PROBE AMP APTIMA: NOT DETECTED

## 2016-03-25 LAB — VITAMIN D 25 HYDROXY (VIT D DEFICIENCY, FRACTURES): VIT D 25 HYDROXY: 47 ng/mL (ref 30–100)

## 2016-03-25 LAB — RPR

## 2016-03-25 LAB — HIV ANTIBODY (ROUTINE TESTING W REFLEX): HIV: NONREACTIVE

## 2016-05-08 ENCOUNTER — Ambulatory Visit (INDEPENDENT_AMBULATORY_CARE_PROVIDER_SITE_OTHER): Payer: BLUE CROSS/BLUE SHIELD | Admitting: Urgent Care

## 2016-05-08 VITALS — BP 110/76 | HR 97 | Temp 97.9°F | Resp 16 | Ht 73.0 in | Wt 257.6 lb

## 2016-05-08 DIAGNOSIS — J302 Other seasonal allergic rhinitis: Secondary | ICD-10-CM | POA: Diagnosis not present

## 2016-05-08 DIAGNOSIS — R0981 Nasal congestion: Secondary | ICD-10-CM | POA: Diagnosis not present

## 2016-05-08 DIAGNOSIS — J3489 Other specified disorders of nose and nasal sinuses: Secondary | ICD-10-CM

## 2016-05-08 DIAGNOSIS — R05 Cough: Secondary | ICD-10-CM | POA: Diagnosis not present

## 2016-05-08 DIAGNOSIS — R059 Cough, unspecified: Secondary | ICD-10-CM

## 2016-05-08 MED ORDER — HYDROCODONE-HOMATROPINE 5-1.5 MG/5ML PO SYRP: 5.0000 mL | ORAL_SOLUTION | Freq: Every evening | ORAL | 0 refills | Status: DC | PRN

## 2016-05-08 MED ORDER — AZITHROMYCIN 250 MG PO TABS: ORAL_TABLET | ORAL | 0 refills | Status: DC

## 2016-05-08 MED ORDER — BENZONATATE 100 MG PO CAPS: 100.0000 mg | ORAL_CAPSULE | Freq: Three times a day (TID) | ORAL | 0 refills | Status: DC | PRN

## 2016-05-08 NOTE — Patient Instructions (Addendum)
Cough, Adult Coughing is a reflex that clears your throat and your airways. Coughing helps to heal and protect your lungs. It is normal to cough occasionally, but a cough that happens with other symptoms or lasts a long time may be a sign of a condition that needs treatment. A cough may last only 2-3 weeks (acute), or it may last longer than 8 weeks (chronic). CAUSES Coughing is commonly caused by:  Breathing in substances that irritate your lungs.  A viral or bacterial respiratory infection.  Allergies.  Asthma.  Postnasal drip.  Smoking.  Acid backing up from the stomach into the esophagus (gastroesophageal reflux).  Certain medicines.  Chronic lung problems, including COPD (or rarely, lung cancer).  Other medical conditions such as heart failure. HOME CARE INSTRUCTIONS  Pay attention to any changes in your symptoms. Take these actions to help with your discomfort:  Take medicines only as told by your health care provider.  If you were prescribed an antibiotic medicine, take it as told by your health care provider. Do not stop taking the antibiotic even if you start to feel better.  Talk with your health care provider before you take a cough suppressant medicine.  Drink enough fluid to keep your urine clear or pale yellow.  If the air is dry, use a cold steam vaporizer or humidifier in your bedroom or your home to help loosen secretions.  Avoid anything that causes you to cough at work or at home.  If your cough is worse at night, try sleeping in a semi-upright position.  Avoid cigarette smoke. If you smoke, quit smoking. If you need help quitting, ask your health care provider.  Avoid caffeine.  Avoid alcohol.  Rest as needed. SEEK MEDICAL CARE IF:   You have new symptoms.  You cough up pus.  Your cough does not get better after 2-3 weeks, or your cough gets worse.  You cannot control your cough with suppressant medicines and you are losing sleep.  You  develop pain that is getting worse or pain that is not controlled with pain medicines.  You have a fever.  You have unexplained weight loss.  You have night sweats. SEEK IMMEDIATE MEDICAL CARE IF:  You cough up blood.  You have difficulty breathing.  Your heartbeat is very fast.   This information is not intended to replace advice given to you by your health care provider. Make sure you discuss any questions you have with your health care provider.   Document Released: 02/06/2011 Document Revised: 05/01/2015 Document Reviewed: 10/17/2014 Elsevier Interactive Patient Education 2016 Elsevier Inc.     IF you received an x-ray today, you will receive an invoice from [facility]. Please contact [facility] at [phone] with questions or concerns regarding your invoice.   IF you received labwork today, you will receive an invoice from [facility]. Please contact [facility] at [phone] with questions or concerns regarding your invoice.   Our billing staff will not be able to assist you with questions regarding bills from these companies.  You will be contacted with the lab results as soon as they are available. The fastest way to get your results is to activate your My Chart account. Instructions are located on the last page of this paperwork. If you have not heard from us regarding the results in 2 weeks, please contact this office.      

## 2016-05-08 NOTE — Progress Notes (Signed)
    MRN: [id number] DOB: 1985/11/10  Subjective:   Paul Humphrey is a 30 y.o. male presenting for chief complaint of Cough (green, brownish,to clear mucus, started a little over 1 wk ago)  Reports 1 week history of worsening productive cough, subjective fever. Cough elicits chest pain, shob.  Has also had sinus congestion, hoarse voice, right ear popping, soreness under lymph nodes under jaw. Has tried DayQuil and NyQuil, Mucinex. Denies sinus pain, wheezing, n/v, abdominal pain, rashes. He has had some sick contacts, teaches at a college. Admits history of seasonal allergies in the Spring, Fall, Winter; takes Claritin for this. Denies smoking cigarettes.  Paul Humphrey has a current medication list which includes the following prescription(s): cholecalciferol, multiple vitamins-minerals, and sucralfate. Also is allergic to ivp dye [iodinated diagnostic agents]; gadolinium derivatives; lactose intolerance (gi); and percocet [oxycodone-acetaminophen].  Paul Humphrey  has a past medical history of Allergy. Also  has a past surgical history that includes Colon surgery (07/23/2012); reverse colostomy (01/02/2013); and Cholecystectomy.  Objective:   Vitals: BP 110/76 (BP Location: Left Arm, Patient Position: Sitting, Cuff Size: Large)   Pulse 97   Temp 97.9 F (36.6 C) (Oral)   Resp 16   Ht 6\' 1"  (1.854 m)   Wt 257 lb 9.6 oz (116.8 kg)   SpO2 98%   BMI 33.99 kg/m   Physical Exam  Constitutional: He is oriented to person, place, and time. He appears well-developed and well-nourished.  HENT:  TM's intact bilaterally but no effusions or erythema. Nasal turbinates mildly erythematous but nasal passages patent, mild bilateral maxillary sinus tenderness. Moderate postnasal drip present but without oropharyngeal exudates, erythema or abscesses.  Eyes: Right eye exhibits no discharge. Left eye exhibits no discharge. No scleral icterus.  Neck: Normal range of motion. Neck supple.  Cardiovascular: Normal rate, regular  rhythm and intact distal pulses.  Exam reveals no gallop and no friction rub.   No murmur heard. Pulmonary/Chest: No respiratory distress. He has no wheezes. He has no rales.  Coarse lung sounds over mid-lower right lung fields.  Lymphadenopathy:    He has no cervical adenopathy.  Neurological: He is alert and oriented to person, place, and time.  Skin: Skin is warm and dry.   Assessment and Plan :   1. Cough - Will cover for lower respiratory infection. Start azithromycin, use cough suppression medications. RTC in 4 days if no improvement. We decided to defer chest x-ray, cbc today but will pursue this if there is no improvement with the aforementioned treatment plan.  2. Sinus congestion 3. Sinus pain 4. Seasonal allergies - Restart allergy medications to address an allergy component.  Jaynee Eagles, PA-C [facility] [phone] 05/08/2016 5:53 PM

## 2016-05-12 DIAGNOSIS — M25551 Pain in right hip: Secondary | ICD-10-CM | POA: Diagnosis not present

## 2016-05-12 DIAGNOSIS — S335XXA Sprain of ligaments of lumbar spine, initial encounter: Secondary | ICD-10-CM | POA: Diagnosis not present

## 2016-05-16 ENCOUNTER — Ambulatory Visit (INDEPENDENT_AMBULATORY_CARE_PROVIDER_SITE_OTHER): Payer: BLUE CROSS/BLUE SHIELD | Admitting: Family Medicine

## 2016-05-16 ENCOUNTER — Ambulatory Visit (INDEPENDENT_AMBULATORY_CARE_PROVIDER_SITE_OTHER): Payer: BLUE CROSS/BLUE SHIELD

## 2016-05-16 VITALS — BP 142/66 | HR 90 | Temp 98.0°F | Resp 18 | Ht 73.0 in | Wt 256.0 lb

## 2016-05-16 DIAGNOSIS — J9811 Atelectasis: Secondary | ICD-10-CM | POA: Diagnosis not present

## 2016-05-16 DIAGNOSIS — J209 Acute bronchitis, unspecified: Secondary | ICD-10-CM

## 2016-05-16 MED ORDER — IPRATROPIUM-ALBUTEROL 0.5-2.5 (3) MG/3ML IN SOLN: 3.0000 mL | Freq: Four times a day (QID) | RESPIRATORY_TRACT | Status: DC

## 2016-05-16 MED ORDER — ALBUTEROL SULFATE (2.5 MG/3ML) 0.083% IN NEBU
2.5000 mg | INHALATION_SOLUTION | Freq: Once | RESPIRATORY_TRACT | Status: AC
  Administered 2016-05-16: 2.5 mg via RESPIRATORY_TRACT

## 2016-05-16 MED ORDER — IPRATROPIUM BROMIDE 0.02 % IN SOLN
0.5000 mg | Freq: Once | RESPIRATORY_TRACT | Status: AC
  Administered 2016-05-16: 0.5 mg via RESPIRATORY_TRACT

## 2016-05-16 MED ORDER — ALBUTEROL SULFATE 108 (90 BASE) MCG/ACT IN AEPB: 2.0000 | INHALATION_SPRAY | Freq: Four times a day (QID) | RESPIRATORY_TRACT | 3 refills | Status: DC | PRN

## 2016-05-16 NOTE — Patient Instructions (Addendum)
I believe the problem was initiated by a virus but has been worsened by some environmental exposure. Usually this takes a week or 2 to resolve. In your case seems to be going on longer is no sign of pneumonia and I think we need to do is add some albuterol inhaler. Please let us not do not continue to improve over the next 48-72 hours.    IF you received an x-ray today, you will receive an invoice from [facility]. Please contact [facility] at [phone] with questions or concerns regarding your invoice.   IF you received labwork today, you will receive an invoice from [employer]. Please contact [facility] at [phone] with questions or concerns regarding your invoice.   Our billing staff will not be able to assist you with questions regarding bills from these companies.  You will be contacted with the lab results as soon as they are available. The fastest way to get your results is to activate your My Chart account. Instructions are located on the last page of this paperwork. If you have not heard from [place] regarding the results in 2 weeks, please contact this office.     Acute Bronchitis Bronchitis is inflammation of the airways that extend from the windpipe into the lungs (bronchi). The inflammation often causes mucus to develop. This leads to a cough, which is the most common symptom of bronchitis.  In acute bronchitis, the condition usually develops suddenly and goes away over time, usually in a couple weeks. Smoking, allergies, and asthma can make bronchitis worse. Repeated episodes of bronchitis may cause further lung problems.  CAUSES Acute bronchitis is most often caused by the same virus that causes a cold. The virus can spread from person to person (contagious) through coughing, sneezing, and touching contaminated objects. SIGNS AND SYMPTOMS   Cough.   Fever.   Coughing up mucus.   Body aches.   Chest congestion.   Chills.    Shortness of breath.   Sore throat.  DIAGNOSIS  Acute bronchitis is usually diagnosed through a physical exam. Your health care provider will also ask you questions about your medical history. Tests, such as chest X-rays, are sometimes done to rule out other conditions.  TREATMENT  Acute bronchitis usually goes away in a couple weeks. Oftentimes, no medical treatment is necessary. Medicines are sometimes given for relief of fever or cough. Antibiotic medicines are usually not needed but may be prescribed in certain situations. In some cases, an inhaler may be recommended to help reduce shortness of breath and control the cough. A cool mist vaporizer may also be used to help thin bronchial secretions and make it easier to clear the chest.  HOME CARE INSTRUCTIONS  Get plenty of rest.   Drink enough fluids to keep your urine clear or pale yellow (unless you have a medical condition that requires fluid restriction). Increasing fluids may help thin your respiratory secretions (sputum) and reduce chest congestion, and it will prevent dehydration.   Take medicines only as directed by your health care provider.  If you were prescribed an antibiotic medicine, finish it all even if you start to feel better.  Avoid smoking and secondhand smoke. Exposure to cigarette smoke or irritating chemicals will make bronchitis worse. If you are a smoker, consider using nicotine gum or skin patches to help control withdrawal symptoms. Quitting smoking will help your lungs heal faster.   Reduce the chances of another bout of acute bronchitis by washing your hands frequently,  avoiding people with cold symptoms, and trying not to touch your hands to your mouth, nose, or eyes.   Keep all follow-up visits as directed by your health care provider.  SEEK MEDICAL CARE IF: Your symptoms do not improve after 1 week of treatment.  SEEK IMMEDIATE MEDICAL CARE IF:  You develop an increased fever or chills.    You have chest pain.   You have severe shortness of breath.  You have bloody sputum.   You develop dehydration.  You faint or repeatedly feel like you are going to pass out.  You develop repeated vomiting.  You develop a severe headache. MAKE SURE YOU:   Understand these instructions.  Will watch your condition.  Will get help right away if you are not doing well or get worse.   This information is not intended to replace advice given to you by your health care provider. Make sure you discuss any questions you have with your health care provider.   Document Released: 09/17/2004 Document Revised: 08/31/2014 Document Reviewed: 01/31/2013 Elsevier Interactive Patient Education [employer].

## 2016-05-16 NOTE — Progress Notes (Signed)
This is a 30 year old male with 3-1/2 weeks of illness.  He was seen 1 week ago and given a Z-Pak and some cough medicine. The next day, he coughed hard and pulled some back muscles. He was then seen by orthopedics who treated him with prednisone and methocarbamol. He seemed to be getting somewhat better by the middle of the week, but after finishing the azithromycin, he started coughing more violently.  He denies fever or shortness of breath. 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 He also thinks that the season change may be contributing.  He still has some cough medicine left.  Objective:BP (!) 142/66   Pulse 90   Temp 98 F (36.7 C) (Oral)   Resp 18   Ht 6\' 1"  (1.854 m)   Wt 256 lb (116.1 kg)   SpO2 96%   BMI 33.78 kg/m  HEENT: Unremarkable Chest: Coarse breath sounds bilaterally, no rales or rhonchi. Heart: Regular no murmur  [facility] reading (PRIMARY) by  Dr. Joseph Art:  CXR.Bibasilar atelectasis.    Assessment:  Persistent symptoms that do not seem to be responding to the azithromycin no or prednisone. There is no definite signs of pneumonia. In all likelihood, patient had a viral illness followed by some environmental factors that augment his symptoms  Plan:  Acute bronchitis, unspecified organism - Plan: DG Chest 2 View, ipratropium-albuterol (DUONEB) 0.5-2.5 (3) MG/3ML nebulizer solution 3 mL, Albuterol Sulfate (PROAIR RESPICLICK) [id number] (90 Base) MCG/ACT AEPB  Robyn Haber, MD

## 2016-05-19 DIAGNOSIS — M545 Low back pain: Secondary | ICD-10-CM | POA: Diagnosis not present

## 2016-05-20 DIAGNOSIS — M545 Low back pain: Secondary | ICD-10-CM | POA: Diagnosis not present

## 2016-05-20 DIAGNOSIS — R262 Difficulty in walking, not elsewhere classified: Secondary | ICD-10-CM | POA: Diagnosis not present

## 2016-05-22 DIAGNOSIS — M545 Low back pain: Secondary | ICD-10-CM | POA: Diagnosis not present

## 2016-05-22 DIAGNOSIS — R262 Difficulty in walking, not elsewhere classified: Secondary | ICD-10-CM | POA: Diagnosis not present

## 2016-05-26 DIAGNOSIS — R262 Difficulty in walking, not elsewhere classified: Secondary | ICD-10-CM | POA: Diagnosis not present

## 2016-05-26 DIAGNOSIS — M545 Low back pain: Secondary | ICD-10-CM | POA: Diagnosis not present

## 2016-05-31 DIAGNOSIS — M545 Low back pain: Secondary | ICD-10-CM | POA: Diagnosis not present

## 2016-06-02 DIAGNOSIS — M5136 Other intervertebral disc degeneration, lumbar region: Secondary | ICD-10-CM | POA: Diagnosis not present

## 2016-06-03 DIAGNOSIS — M5136 Other intervertebral disc degeneration, lumbar region: Secondary | ICD-10-CM | POA: Diagnosis not present

## 2016-06-03 DIAGNOSIS — M545 Low back pain: Secondary | ICD-10-CM | POA: Diagnosis not present

## 2016-06-07 ENCOUNTER — Encounter (HOSPITAL_COMMUNITY): Payer: Self-pay | Admitting: *Deleted

## 2016-06-07 ENCOUNTER — Emergency Department (HOSPITAL_COMMUNITY): Payer: BLUE CROSS/BLUE SHIELD

## 2016-06-07 ENCOUNTER — Emergency Department (HOSPITAL_COMMUNITY)
Admission: EM | Admit: 2016-06-07 | Discharge: 2016-06-07 | Disposition: A | Discharge status: Home / Self Care | Payer: BLUE CROSS/BLUE SHIELD | Attending: Emergency Medicine | Admitting: Emergency Medicine

## 2016-06-07 DIAGNOSIS — R109 Unspecified abdominal pain: Secondary | ICD-10-CM

## 2016-06-07 DIAGNOSIS — N2882 Megaloureter: Secondary | ICD-10-CM

## 2016-06-07 DIAGNOSIS — R1032 Left lower quadrant pain: Secondary | ICD-10-CM | POA: Diagnosis not present

## 2016-06-07 LAB — CBC
HCT: 48.1 % (ref 39.0–52.0)
HEMOGLOBIN: 16.5 g/dL (ref 13.0–17.0)
MCH: 29.8 pg (ref 26.0–34.0)
MCHC: 34.3 g/dL (ref 30.0–36.0)
MCV: 86.8 fL (ref 78.0–100.0)
Platelets: 169 10*3/uL (ref 150–400)
RBC: 5.54 MIL/uL (ref 4.22–5.81)
RDW: 13 % (ref 11.5–15.5)
WBC: 7 10*3/uL (ref 4.0–10.5)

## 2016-06-07 LAB — URINALYSIS, ROUTINE W REFLEX MICROSCOPIC
BILIRUBIN URINE: NEGATIVE
GLUCOSE, UA: NEGATIVE mg/dL
HGB URINE DIPSTICK: NEGATIVE
Ketones, ur: NEGATIVE mg/dL
Leukocytes, UA: NEGATIVE
Nitrite: NEGATIVE
Protein, ur: NEGATIVE mg/dL
SPECIFIC GRAVITY, URINE: 1.01 (ref 1.005–1.030)
pH: 6 (ref 5.0–8.0)

## 2016-06-07 LAB — COMPREHENSIVE METABOLIC PANEL
ALBUMIN: 4.1 g/dL (ref 3.5–5.0)
ALK PHOS: 33 U/L — AB (ref 38–126)
ALT: 40 U/L (ref 17–63)
AST: 26 U/L (ref 15–41)
Anion gap: 9 (ref 5–15)
BUN: 15 mg/dL (ref 6–20)
CALCIUM: 9.3 mg/dL (ref 8.9–10.3)
CHLORIDE: 105 mmol/L (ref 101–111)
CO2: 23 mmol/L (ref 22–32)
CREATININE: 0.96 mg/dL (ref 0.61–1.24)
GFR calc Af Amer: >60 mL/min (ref >60)
GFR calc non Af Amer: >60 mL/min (ref >60)
GLUCOSE: 92 mg/dL (ref 65–99)
Potassium: 4.1 mmol/L (ref 3.5–5.1)
SODIUM: 137 mmol/L (ref 135–145)
Total Bilirubin: 0.8 mg/dL (ref 0.3–1.2)
Total Protein: 6.7 g/dL (ref 6.5–8.1)

## 2016-06-07 MED ORDER — TAMSULOSIN HCL 0.4 MG PO CAPS: 0.4000 mg | ORAL_CAPSULE | Freq: Every day | ORAL | 0 refills | Status: DC

## 2016-06-07 MED ORDER — PHENAZOPYRIDINE HCL 200 MG PO TABS: 200.0000 mg | ORAL_TABLET | Freq: Three times a day (TID) | ORAL | 0 refills | Status: DC | PRN
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
## 2016-06-07 NOTE — ED Triage Notes (Signed)
The pt had his lower back  Injected this past Wednesday  .  Since then he has had some more pain and an increase in urination with chills and temp

## 2016-06-07 NOTE — ED Notes (Signed)
Pt given a urinal to measure urine output

## 2016-06-07 NOTE — Discharge Instructions (Signed)
Read the information below.  Use the prescribed medication as directed.  Please discuss all new medications with your pharmacist.  You may return to the Emergency Department at any time for worsening condition or any new symptoms that concern you.   If you develop high fevers, worsening abdominal pain, uncontrolled vomiting, or are unable to tolerate fluids by mouth, return to the ER for a recheck.  ° °

## 2016-06-18 DIAGNOSIS — M5106 Intervertebral disc disorders with myelopathy, lumbar region: Secondary | ICD-10-CM | POA: Diagnosis not present

## 2016-07-29 DIAGNOSIS — R1031 Right lower quadrant pain: Secondary | ICD-10-CM | POA: Diagnosis not present

## 2016-07-29 DIAGNOSIS — R351 Nocturia: Secondary | ICD-10-CM | POA: Diagnosis not present

## 2016-07-29 DIAGNOSIS — N134 Hydroureter: Secondary | ICD-10-CM | POA: Diagnosis not present

## 2016-07-29 DIAGNOSIS — R350 Frequency of micturition: Secondary | ICD-10-CM | POA: Diagnosis not present

## 2016-08-20 ENCOUNTER — Ambulatory Visit (INDEPENDENT_AMBULATORY_CARE_PROVIDER_SITE_OTHER): Payer: BLUE CROSS/BLUE SHIELD | Admitting: Physician Assistant

## 2016-08-20 VITALS — BP 120/80 | HR 86 | Temp 98.4°F | Resp 16 | Ht 75.0 in | Wt 260.8 lb

## 2016-08-20 DIAGNOSIS — J321 Chronic frontal sinusitis: Secondary | ICD-10-CM

## 2016-08-20 MED ORDER — FLUTICASONE PROPIONATE 50 MCG/ACT NA SUSP: 2.0000 | Freq: Every day | NASAL | 0 refills | Status: DC

## 2016-08-20 MED ORDER — AMOXICILLIN-POT CLAVULANATE 875-125 MG PO TABS: 1.0000 | ORAL_TABLET | Freq: Two times a day (BID) | ORAL | 0 refills | Status: AC

## 2016-08-20 NOTE — Progress Notes (Signed)
    MRN: [id number] DOB: 02-11-1986  Subjective:   Paul Humphrey is a 30 y.o. male presenting for chief complaint of Nasal Congestion (symptoms began 3 weeks ago ); Headache; and Facial Pain (pain/pressure) .  Reports 3 week history of sinus headache, sinus congestion, sore throat and brown/green mucous production. Has tried claritin, dayquil, and nyquil with mild relief.  Has gotten progressively worse. Denies fever, ear pain, ear drainage, wheezing, shortness of breath, chest tightness and myalgia, subjective fever, night sweats, chills, nausea, vomiting, abdominal pain and diarrhea. Has extensive history of seasonal allergies and is especially bothered by mold. No history of asthma. Patient has not flu shot this season. Denies smoking. Pt notes he has a history of sinus infections yearly. He has been followed closely by ENT and they are recommending he have sinus surgery. Otherwise, they have informed him that he will continue to have these recurrent sinus infections.  Denies any other aggravating or relieving factors, no other questions or concerns.  Paul Humphrey has a current medication list which includes the following prescription(s): cholecalciferol and multiple vitamins-minerals. Also is allergic to ivp dye [iodinated diagnostic agents]; gadolinium derivatives; percocet [oxycodone-acetaminophen]; and lactose intolerance (gi).  Paul Humphrey  has a past medical history of Allergy. Also  has a past surgical history that includes Colon surgery (07/23/2012); reverse colostomy (01/02/2013); and Cholecystectomy.   Objective:   Vitals: BP 120/80   Pulse 86   Temp 98.4 F (36.9 C) (Oral)   Resp 16   Ht 6\' 3"  (1.905 m)   Wt 260 lb 12.8 oz (118.3 kg)   SpO2 99%   BMI 32.60 kg/m   Physical Exam  Constitutional: He is oriented to person, place, and time. He appears well-developed and well-nourished.  HENT:  Head: Normocephalic and atraumatic.  Nose: Mucosal edema present. Right sinus exhibits frontal sinus  tenderness. Right sinus exhibits no maxillary sinus tenderness. Left sinus exhibits frontal sinus tenderness. Left sinus exhibits no maxillary sinus tenderness.  Mouth/Throat: Uvula is midline and mucous membranes are normal. Posterior oropharyngeal erythema present.  Eyes: Conjunctivae are normal.  Neck: Normal range of motion.  Cardiovascular: Normal rate, regular rhythm and normal heart sounds.   Pulmonary/Chest: Effort normal and breath sounds normal.  Lymphadenopathy:       Head (right side): No submental, no submandibular, no tonsillar, no preauricular, no posterior auricular and no occipital adenopathy present.       Head (left side): No submental, no submandibular, no tonsillar, no preauricular, no posterior auricular and no occipital adenopathy present.    He has no cervical adenopathy.       Right: No supraclavicular adenopathy present.  Neurological: He is alert and oriented to person, place, and time.  Skin: Skin is warm and dry.  Psychiatric: He has a normal mood and affect.  Vitals reviewed.  No results found for this or any previous visit (from the past 24 hour(s)).  Assessment and Plan :  1. Frontal sinusitis, unspecified chronicity -Due to pt's history and physical exam finding will cover for bacterial etiology.  - amoxicillin-clavulanate (AUGMENTIN) 875-125 MG tablet; Take 1 tablet by mouth 2 (two) times daily.  Dispense: 14 tablet; Refill: 0 - fluticasone (FLONASE) 50 MCG/ACT nasal spray; Place 2 sprays into both nostrils daily.  Dispense: 16 g; Refill: 0 -Return to clinic if symptoms worsen, do not improve in one week, or as needed   Tenna Delaine, PA-C  [facility] 08/20/2016 12:42 PM

## 2016-08-20 NOTE — Patient Instructions (Addendum)
Take antibiotics as prescribed. I would also like you to start using flonase daily along with claritin. -Return to clinic if symptoms worsen, do not improve in one week, or as needed.  Thank you for letting me participate in your health and well being.    Sinusitis, Adult Sinusitis is soreness and inflammation of your sinuses. Sinuses are hollow spaces in the bones around your face. They are located:  Around your eyes.  In the middle of your forehead.  Behind your nose.  In your cheekbones. Your sinuses and nasal passages are lined with a stringy fluid (mucus). Mucus normally drains out of your sinuses. When your nasal tissues get inflamed or swollen, the mucus can get trapped or blocked so air cannot flow through your sinuses. This lets bacteria, viruses, and funguses grow, and that leads to infection. Follow these instructions at home: Medicines  Take, use, or apply over-the-counter and prescription medicines only as told by your doctor. These may include nasal sprays.  If you were prescribed an antibiotic medicine, take it as told by your doctor. Do not stop taking the antibiotic even if you start to feel better. Hydrate and Humidify  Drink enough water to keep your pee (urine) clear or pale yellow.  Use a cool mist humidifier to keep the humidity level in your home above 50%.  Breathe in steam for 10-15 minutes, 3-4 times a day or as told by your doctor. You can do this in the bathroom while a hot shower is running.  Try not to spend time in cool or dry air. Rest  Rest as much as possible.  Sleep with your head raised (elevated).  Make sure to get enough sleep each night. General instructions  Put a warm, moist washcloth on your face 3-4 times a day or as told by your doctor. This will help with discomfort.  Wash your hands often with soap and water. If there is no soap and water, use hand sanitizer.  Do not smoke. Avoid being around people who are smoking (secondhand  smoke).  Keep all follow-up visits as told by your doctor. This is important. Contact a doctor if:  You have a fever.  Your symptoms get worse.  Your symptoms do not get better within 10 days. Get help right away if:  You have a very bad headache.  You cannot stop throwing up (vomiting).  You have pain or swelling around your face or eyes.  You have trouble seeing.  You feel confused.  Your neck is stiff.  You have trouble breathing. This information is not intended to replace advice given to you by your health care provider. Make sure you discuss any questions you have with your health care provider. Document Released: 01/27/2008 Document Revised: 04/05/2016 Document Reviewed: 06/05/2015 Elsevier Interactive Patient Education  2017 [employer].    IF you received an x-ray today, you will receive an invoice from [facility]. Please contact [facility] at [phone] with questions or concerns regarding your invoice.   IF you received labwork today, you will receive an invoice from [facility]. Please contact [facility] at [phone] with questions or concerns regarding your invoice.   Our billing staff will not be able to assist you with questions regarding bills from these companies.  You will be contacted with the lab results as soon as they are available. The fastest way to get your results is to activate your My Chart account. Instructions are located on the last page of this paperwork. If you  have not heard from [place] regarding the results in 2 weeks, please contact this office.

## 2016-11-23 ENCOUNTER — Ambulatory Visit (INDEPENDENT_AMBULATORY_CARE_PROVIDER_SITE_OTHER): Payer: BLUE CROSS/BLUE SHIELD | Admitting: Family Medicine

## 2016-11-23 VITALS — BP 135/82 | HR 79 | Resp 14 | Ht 73.0 in | Wt 263.0 lb

## 2016-11-23 DIAGNOSIS — M79641 Pain in right hand: Secondary | ICD-10-CM | POA: Insufficient documentation

## 2016-11-23 DIAGNOSIS — M79642 Pain in left hand: Secondary | ICD-10-CM

## 2016-11-23 DIAGNOSIS — M79675 Pain in left toe(s): Secondary | ICD-10-CM | POA: Insufficient documentation

## 2016-11-23 MED ORDER — MELOXICAM 15 MG PO TABS: 15.0000 mg | ORAL_TABLET | Freq: Every day | ORAL | 0 refills | Status: DC | PRN

## 2016-11-23 NOTE — Assessment & Plan Note (Signed)
Likely related to underlying OA with history of fracture in that toe. Has loss of transverse arch  - try OTC orthotic  - f/u PRN

## 2016-11-23 NOTE — Assessment & Plan Note (Signed)
Unclear what the origin of his pain is from. Doesn't appear to be gout. Possible for overuse. No finger triggering.  - ESR, CRP - Mobic  - f/u if no improvement. Consider xrays and may need referral for ultrasound.

## 2016-11-23 NOTE — Patient Instructions (Addendum)
Thank you for coming in,   I will call you with the lab results.   You can try the mobic to see if that helps with the pain.   You can try an orthotic from [employer] or [employer] to see if that helps with your toe pain.   Please follow up with me in two weeks if you pain hasn't changed.     Please feel free to call with any questions or concerns at any time, at [phone]. --Dr. Raeford Razor

## 2016-11-23 NOTE — Progress Notes (Signed)
Subjective:    Patient ID: Paul Humphrey, male    DOB: 1985-09-17, 31 y.o.   MRN: [id number]  Chief Complaint  Patient presents with  . Hand Pain    bilateral hands   . Foot Pain    left great toe     PCP: Wendie Agreste, MD  HPI  This is a 31 y.o. male who is presenting with pain between the interdigital web spaces and left great toe pain. This pain started during this winter. 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 He feels it is becoming less frequent as the weather has improved. The pain intensity is severe. Has not tried any medications. It is localized. Denies any redness or swelling. No history of gout. No recent travel. No new medications.  Pain occurs in his hands unrelated to his activity. His left great toe pain seems to be related to long work days. He has a history of a fracture in his left great toe. Denies any unprotected intercourse.   Review of Systems  ROS: No unexpected weight loss, fever, chills, swelling, instability, muscle pain, numbness/tingling, redness, otherwise see HPI   PMH: GERD Surgical: Colectomy, cholecystectomy, umbilical hernia, left lower leg fascial release.  Social: no tobacco use, occasional alcohol use  Fhx: breast cancer, OA,   Patient Active Problem List   Diagnosis Date Noted  . Pain in both hands 11/23/2016  . Great toe pain, left 11/23/2016  . GERD (gastroesophageal reflux disease) 03/18/2015  . Abdominal pain 01/07/2015  . Tingling 01/07/2015  . Suspected exposure to heavy metals 01/07/2015  . Vitamin D deficiency 01/07/2015     Prior to Admission medications   Medication Sig Start Date End Date Taking? Authorizing Provider  cholecalciferol (VITAMIN D) 1000 UNITS tablet Take 1,000 Units by mouth daily.   Yes Historical Provider, MD  loratadine (CLARITIN) 10 MG tablet Take 10 mg by mouth daily.   Yes Historical Provider, MD  Multiple Vitamins-Minerals (MULTIVITAMIN ADULT PO) Take 1 tablet by mouth  daily. Reported on 01/18/2016   Yes Historical Provider, MD  fluticasone (FLONASE) 50 MCG/ACT nasal spray Place 2 sprays into both nostrils daily. Patient not taking: Reported on 11/23/2016 08/20/16   Leonie Douglas, PA-C     Allergies  Allergen Reactions  . Ivp Dye [Iodinated Diagnostic Agents] Nausea Only  . Gadolinium Derivatives Nausea And Vomiting  . Percocet [Oxycodone-Acetaminophen] Other (See Comments)    Skin crawling/burning  . Lactose Intolerance (Gi) Nausea And Vomiting      Objective:   Physical Exam BP 135/82   Pulse 79   Resp 14   Ht _0  (1.854 m)   Wt 263 lb (119.3 kg)   SpO2 98%   BMI 34.70 kg/m  Gen: NAD, alert, cooperative with exam, well-appearing HEENT: NCAT, EOMI, clear conjunctiva, CV: no edema, capillary refill brisk  Resp: non-labored Skin: no rashes, normal turgor  Neuro: no gross deficits.  Psych: alert and oriented Hands:  No overlying ecchymosis or swelling.  Normal wrist ROM  Normal finger adduction and abduction  Normal thumb opposition  Normal pincer grasp Normal strength  Normal finger alignment  No TTP in the snuffbox  Left foot:  No great toe swelling or redness.  Limited flexion and extension  No abnormal callus formation  Normal sensation  Significant loss of transverse arch  Normal longitudinal arch Neurovascularly intact       Assessment & Plan:   Pain in both hands Unclear what the origin  of his pain is from. Doesn't appear to be gout. Possible for overuse. No finger triggering.  - ESR, CRP - Mobic  - f/u if no improvement. Consider xrays and may need referral for ultrasound.   Great toe pain, left Likely related to underlying OA with history of fracture in that toe. Has loss of transverse arch  - try OTC orthotic  - f/u PRN

## 2016-11-24 DIAGNOSIS — J320 Chronic maxillary sinusitis: Secondary | ICD-10-CM | POA: Insufficient documentation

## 2016-11-24 DIAGNOSIS — J322 Chronic ethmoidal sinusitis: Secondary | ICD-10-CM | POA: Diagnosis not present

## 2016-11-24 DIAGNOSIS — J342 Deviated nasal septum: Secondary | ICD-10-CM | POA: Insufficient documentation

## 2016-11-24 LAB — SEDIMENTATION RATE: Sed Rate: 2 mm/hr (ref 0–15)

## 2016-11-24 LAB — TSH: TSH: 2.24 u[IU]/mL (ref 0.450–4.500)

## 2016-11-24 LAB — C-REACTIVE PROTEIN: CRP: 0.8 mg/L (ref 0.0–4.9)

## 2016-11-25 ENCOUNTER — Telehealth: Payer: Self-pay | Admitting: Family Medicine

## 2016-11-25 NOTE — Telephone Encounter (Signed)
Spoke with patient about his lab results.   Rosemarie Ax, MD PGY-4, [facility] 11/25/2016, 4:58 PM

## 2016-12-22 ENCOUNTER — Ambulatory Visit: Payer: Self-pay | Admitting: Otolaryngology

## 2016-12-22 DIAGNOSIS — J342 Deviated nasal septum: Secondary | ICD-10-CM

## 2016-12-22 DIAGNOSIS — J320 Chronic maxillary sinusitis: Secondary | ICD-10-CM

## 2016-12-22 DIAGNOSIS — J322 Chronic ethmoidal sinusitis: Secondary | ICD-10-CM

## 2016-12-22 HISTORY — DX: Chronic maxillary sinusitis: J32.0

## 2016-12-22 HISTORY — DX: Chronic ethmoidal sinusitis: J32.2

## 2016-12-22 HISTORY — DX: Deviated nasal septum: J34.2

## 2016-12-28 DIAGNOSIS — S01511A Laceration without foreign body of lip, initial encounter: Secondary | ICD-10-CM

## 2016-12-28 HISTORY — DX: Laceration without foreign body of lip, initial encounter: S01.511A

## 2016-12-28 NOTE — H&P (Signed)
Paul Humphrey is an 31 y.o. male.   Chief Complaint: chronic sinus problems HPI: many year history of chronic and recurring sinus infections and nasal obstruction.  Past Medical History:  Diagnosis Date  . Allergy     Past Surgical History:  Procedure Laterality Date  . CHOLECYSTECTOMY    . COLON SURGERY  07/23/2012   Secondary to perforation   . reverse colostomy  01/02/2013    Family History  Problem Relation Age of Onset  . Breast cancer Mother     Living  . Arthritis Mother   . Arthritis Father   . Diabetes Maternal Grandmother   . Hypertension Maternal Grandmother   . Diabetes Paternal Grandmother   . Diabetes Paternal Grandfather   . Healthy Brother   . Diabetes Paternal Aunt    Social History:  reports that he has never smoked. He has never used smokeless tobacco. He reports that he does not drink alcohol or use drugs.  Allergies:  Allergies  Allergen Reactions  . Ivp Dye [Iodinated Diagnostic Agents] Nausea Only  . Gadolinium Derivatives Nausea And Vomiting  . Percocet [Oxycodone-Acetaminophen] Other (See Comments)    Skin crawling/burning  . Lactose Intolerance (Gi) Nausea And Vomiting    No prescriptions prior to admission.    No results found for this or any previous visit (from the past 48 hour(s)). No results found.  ROS: otherwise negative  There were no vitals taken for this visit.  PHYSICAL EXAM: Overall appearance:  Healthy appearing, in no distress Head:  Normocephalic, atraumatic. Ears: External auditory canals are clear; tympanic membranes are intact and the middle ears are free of any effusion. Nose: External nose is healthy in appearance. Internal nasal exam reveal septal deviation and large inferior turbinates. Oral Cavity/pharynx:  There are no mucosal lesions or masses identified. Hypopharynx/Larynx: no signs of any mucosal lesions or masses identified. Vocal cords move normally. Neuro:  No identifiable neurologic deficits. Neck: No  palpable neck masses.  Studies Reviewed: none    Assessment/Plan Proceed with nasal septoplasty, reduction of turbinates and sinus surgery.  Paul Humphrey 12/28/2016, 11:11 AM

## 2016-12-29 ENCOUNTER — Encounter (HOSPITAL_BASED_OUTPATIENT_CLINIC_OR_DEPARTMENT_OTHER): Payer: Self-pay | Admitting: *Deleted

## 2017-01-04 ENCOUNTER — Ambulatory Visit (HOSPITAL_BASED_OUTPATIENT_CLINIC_OR_DEPARTMENT_OTHER)
Admission: RE | Admit: 2017-01-04 | Discharge: 2017-01-04 | Disposition: A | Discharge status: Home / Self Care | Payer: BLUE CROSS/BLUE SHIELD | Source: Ambulatory Visit | Attending: Otolaryngology | Admitting: Otolaryngology

## 2017-01-04 ENCOUNTER — Encounter (HOSPITAL_BASED_OUTPATIENT_CLINIC_OR_DEPARTMENT_OTHER)
Admission: RE | Disposition: A | Discharge status: Home / Self Care | Payer: Self-pay | Source: Ambulatory Visit | Attending: Otolaryngology

## 2017-01-04 ENCOUNTER — Ambulatory Visit (HOSPITAL_BASED_OUTPATIENT_CLINIC_OR_DEPARTMENT_OTHER): Payer: BLUE CROSS/BLUE SHIELD | Admitting: Anesthesiology

## 2017-01-04 ENCOUNTER — Encounter (HOSPITAL_BASED_OUTPATIENT_CLINIC_OR_DEPARTMENT_OTHER): Payer: Self-pay | Admitting: *Deleted

## 2017-01-04 DIAGNOSIS — Z885 Allergy status to narcotic agent status: Secondary | ICD-10-CM | POA: Insufficient documentation

## 2017-01-04 DIAGNOSIS — J322 Chronic ethmoidal sinusitis: Secondary | ICD-10-CM | POA: Insufficient documentation

## 2017-01-04 DIAGNOSIS — J338 Other polyp of sinus: Secondary | ICD-10-CM | POA: Diagnosis not present

## 2017-01-04 DIAGNOSIS — M1990 Unspecified osteoarthritis, unspecified site: Secondary | ICD-10-CM | POA: Insufficient documentation

## 2017-01-04 DIAGNOSIS — J342 Deviated nasal septum: Secondary | ICD-10-CM | POA: Insufficient documentation

## 2017-01-04 DIAGNOSIS — J320 Chronic maxillary sinusitis: Secondary | ICD-10-CM | POA: Diagnosis not present

## 2017-01-04 DIAGNOSIS — J343 Hypertrophy of nasal turbinates: Secondary | ICD-10-CM | POA: Diagnosis not present

## 2017-01-04 DIAGNOSIS — J329 Chronic sinusitis, unspecified: Secondary | ICD-10-CM | POA: Diagnosis not present

## 2017-01-04 DIAGNOSIS — K219 Gastro-esophageal reflux disease without esophagitis: Secondary | ICD-10-CM | POA: Insufficient documentation

## 2017-01-04 HISTORY — DX: Other cervical disc displacement, unspecified cervical region: M50.20

## 2017-01-04 HISTORY — DX: Laceration without foreign body of lip, initial encounter: S01.511A

## 2017-01-04 HISTORY — PX: NASAL SEPTOPLASTY W/ TURBINOPLASTY: SHX2070

## 2017-01-04 HISTORY — DX: Primary osteoarthritis, unspecified ankle and foot: M19.079

## 2017-01-04 HISTORY — DX: Deviated nasal septum: J34.2

## 2017-01-04 HISTORY — DX: Chronic maxillary sinusitis: J32.0

## 2017-01-04 HISTORY — DX: Chronic ethmoidal sinusitis: J32.2

## 2017-01-04 HISTORY — PX: NASAL SINUS SURGERY: SHX719

## 2017-01-04 HISTORY — DX: Dental restoration status: Z98.811

## 2017-01-04 HISTORY — DX: Family history of other specified conditions: Z84.89

## 2017-01-04 HISTORY — DX: Headache: R51

## 2017-01-04 HISTORY — DX: Congenital megaureter: Q62.2

## 2017-01-04 HISTORY — DX: Headache, unspecified: R51.9

## 2017-01-04 HISTORY — DX: Other cervical disc degeneration, unspecified cervical region: M50.30

## 2017-01-04 SURGERY — SEPTOPLASTY, NOSE, WITH NASAL TURBINATE REDUCTION
Anesthesia: General | Laterality: Bilateral

## 2017-01-04 MED ORDER — OXYMETAZOLINE HCL 0.05 % NA SOLN
NASAL | Status: AC
  Filled 2017-01-04: qty 30

## 2017-01-04 MED ORDER — SUCCINYLCHOLINE CHLORIDE 200 MG/10ML IV SOSY
PREFILLED_SYRINGE | INTRAVENOUS | Status: DC | PRN
  Administered 2017-01-04: 100 mg via INTRAVENOUS

## 2017-01-04 MED ORDER — FENTANYL CITRATE (PF) 100 MCG/2ML IJ SOLN
25.0000 ug | INTRAMUSCULAR | Status: DC | PRN
  Administered 2017-01-04 (×2): 50 ug via INTRAVENOUS

## 2017-01-04 MED ORDER — SILVER NITRATE-POT NITRATE 75-25 % EX MISC
CUTANEOUS | Status: AC
  Filled 2017-01-04: qty 3

## 2017-01-04 MED ORDER — DEXAMETHASONE SODIUM PHOSPHATE 4 MG/ML IJ SOLN
INTRAMUSCULAR | Status: DC | PRN
  Administered 2017-01-04: 10 mg via INTRAVENOUS

## 2017-01-04 MED ORDER — PROMETHAZINE HCL 25 MG RE SUPP: 25.0000 mg | Freq: Four times a day (QID) | RECTAL | 1 refills | Status: DC | PRN

## 2017-01-04 MED ORDER — HYDROCODONE-ACETAMINOPHEN 7.5-325 MG PO TABS: 1.0000 | ORAL_TABLET | Freq: Four times a day (QID) | ORAL | 0 refills | Status: DC | PRN

## 2017-01-04 MED ORDER — PROPOFOL 10 MG/ML IV BOLUS
INTRAVENOUS | Status: DC | PRN
  Administered 2017-01-04: 200 mg via INTRAVENOUS

## 2017-01-04 MED ORDER — FENTANYL CITRATE (PF) 100 MCG/2ML IJ SOLN
50.0000 ug | INTRAMUSCULAR | Status: AC | PRN
  Administered 2017-01-04: 100 ug via INTRAVENOUS
  Administered 2017-01-04 (×2): 25 ug via INTRAVENOUS
  Administered 2017-01-04: 50 ug via INTRAVENOUS

## 2017-01-04 MED ORDER — ONDANSETRON HCL 4 MG/2ML IJ SOLN
INTRAMUSCULAR | Status: AC
  Filled 2017-01-04: qty 2

## 2017-01-04 MED ORDER — LIDOCAINE-EPINEPHRINE 1 %-1:100000 IJ SOLN
INTRAMUSCULAR | Status: DC | PRN
  Administered 2017-01-04: 10 mL

## 2017-01-04 MED ORDER — CEPHALEXIN 500 MG PO CAPS: 500.0000 mg | ORAL_CAPSULE | Freq: Three times a day (TID) | ORAL | 0 refills | Status: DC

## 2017-01-04 MED ORDER — OXYMETAZOLINE HCL 0.05 % NA SOLN
NASAL | Status: AC
  Filled 2017-01-04: qty 15

## 2017-01-04 MED ORDER — FENTANYL CITRATE (PF) 100 MCG/2ML IJ SOLN
INTRAMUSCULAR | Status: AC
  Filled 2017-01-04: qty 2

## 2017-01-04 MED ORDER — MIDAZOLAM HCL 2 MG/2ML IJ SOLN
1.0000 mg | INTRAMUSCULAR | Status: DC | PRN
  Administered 2017-01-04: 2 mg via INTRAVENOUS

## 2017-01-04 MED ORDER — CEFAZOLIN SODIUM-DEXTROSE 2-4 GM/100ML-% IV SOLN
2.0000 g | INTRAVENOUS | Status: AC
  Administered 2017-01-04: 2 g via INTRAVENOUS

## 2017-01-04 MED ORDER — SCOPOLAMINE 1 MG/3DAYS TD PT72: 1.0000 | MEDICATED_PATCH | Freq: Once | TRANSDERMAL | Status: DC | PRN

## 2017-01-04 MED ORDER — LACTATED RINGERS IV SOLN
INTRAVENOUS | Status: DC
  Administered 2017-01-04 (×3): via INTRAVENOUS

## 2017-01-04 MED ORDER — PROMETHAZINE HCL 25 MG/ML IJ SOLN
6.2500 mg | INTRAMUSCULAR | Status: DC | PRN
  Administered 2017-01-04: 6.25 mg via INTRAVENOUS

## 2017-01-04 MED ORDER — SUCCINYLCHOLINE CHLORIDE 200 MG/10ML IV SOSY
PREFILLED_SYRINGE | INTRAVENOUS | Status: AC
  Filled 2017-01-04: qty 10

## 2017-01-04 MED ORDER — DEXAMETHASONE SODIUM PHOSPHATE 10 MG/ML IJ SOLN
INTRAMUSCULAR | Status: AC
Start: 2017-01-04 — End: 2017-01-04
  Filled 2017-01-04: qty 1

## 2017-01-04 MED ORDER — PROMETHAZINE HCL 25 MG/ML IJ SOLN
INTRAMUSCULAR | Status: AC
  Filled 2017-01-04: qty 1

## 2017-01-04 MED ORDER — LIDOCAINE 2% (20 MG/ML) 5 ML SYRINGE
INTRAMUSCULAR | Status: DC | PRN
  Administered 2017-01-04: 100 mg via INTRAVENOUS

## 2017-01-04 MED ORDER — MIDAZOLAM HCL 2 MG/2ML IJ SOLN
INTRAMUSCULAR | Status: AC
  Filled 2017-01-04: qty 2

## 2017-01-04 MED ORDER — OXYMETAZOLINE HCL 0.05 % NA SOLN
2.0000 | NASAL | Status: DC
  Administered 2017-01-04 (×2): 2 via NASAL

## 2017-01-04 MED ORDER — BACITRACIN ZINC 500 UNIT/GM EX OINT
TOPICAL_OINTMENT | CUTANEOUS | Status: AC
  Filled 2017-01-04: qty 56.7

## 2017-01-04 MED ORDER — LIDOCAINE-EPINEPHRINE 1 %-1:100000 IJ SOLN
INTRAMUSCULAR | Status: AC
  Filled 2017-01-04: qty 2

## 2017-01-04 MED ORDER — PROPOFOL 10 MG/ML IV BOLUS
INTRAVENOUS | Status: AC
  Filled 2017-01-04: qty 20

## 2017-01-04 MED ORDER — BACITRACIN ZINC 500 UNIT/GM EX OINT
TOPICAL_OINTMENT | CUTANEOUS | Status: DC | PRN
  Administered 2017-01-04: 1 via TOPICAL

## 2017-01-04 MED ORDER — OXYMETAZOLINE HCL 0.05 % NA SOLN
NASAL | Status: DC | PRN
  Administered 2017-01-04: 1 via TOPICAL

## 2017-01-04 MED ORDER — ARTIFICIAL TEARS OPHTHALMIC OINT
TOPICAL_OINTMENT | OPHTHALMIC | Status: AC
  Filled 2017-01-04: qty 3.5

## 2017-01-04 MED ORDER — BACIT-POLY-NEO HC 1 % EX OINT
TOPICAL_OINTMENT | CUTANEOUS | Status: AC
  Filled 2017-01-04: qty 15

## 2017-01-04 MED ORDER — LIDOCAINE 2% (20 MG/ML) 5 ML SYRINGE
INTRAMUSCULAR | Status: AC
  Filled 2017-01-04: qty 5

## 2017-01-04 MED ORDER — CEFAZOLIN SODIUM-DEXTROSE 2-4 GM/100ML-% IV SOLN
INTRAVENOUS | Status: AC
  Filled 2017-01-04: qty 100

## 2017-01-04 MED ORDER — ONDANSETRON HCL 4 MG/2ML IJ SOLN
INTRAMUSCULAR | Status: DC | PRN
  Administered 2017-01-04: 4 mg via INTRAVENOUS

## 2017-01-04 SURGICAL SUPPLY — 58 items
ATTRACTOMAT 16X20 MAGNETIC DRP (DRAPES) ×3 IMPLANT
BLADE RAD40 ROTATE 4M 4 5PK (BLADE) IMPLANT
BLADE RAD40 ROTATE 4M 4MM 5PK (BLADE)
BLADE RAD60 ROTATE M4 4 5PK (BLADE) IMPLANT
BLADE RAD60 ROTATE M4 4MM 5PK (BLADE)
BLADE TRICUT ROTATE M4 4 5PK (BLADE) ×1 IMPLANT
BLADE TRICUT ROTATE M4 4MM 5PK (BLADE) ×1
BUR HS RAD FRONTAL 3 (BURR) IMPLANT
BUR HS RAD FRONTAL 3MM (BURR)
CANISTER SUC SOCK COL 7IN (MISCELLANEOUS) ×2 IMPLANT
CANISTER SUCT 1200ML W/VALVE (MISCELLANEOUS) ×6 IMPLANT
CLOSURE WOUND 1/2 X4 (GAUZE/BANDAGES/DRESSINGS)
COAGULATOR SUCT 8FR VV (MISCELLANEOUS) IMPLANT
CORDS BIPOLAR (ELECTRODE) IMPLANT
DECANTER SPIKE VIAL GLASS SM (MISCELLANEOUS) IMPLANT
DRESSING ADAPTIC 1/2  N-ADH (PACKING) IMPLANT
DRESSING NASAL KENNEDY 3.5X.9 (MISCELLANEOUS) IMPLANT
DRSG NASAL KENNEDY 3.5X.9 (MISCELLANEOUS)
DRSG NASAL KENNEDY LMNT 8CM (GAUZE/BANDAGES/DRESSINGS) IMPLANT
DRSG NASOPORE 8CM (GAUZE/BANDAGES/DRESSINGS) ×2 IMPLANT
DRSG TELFA 3X8 NADH (GAUZE/BANDAGES/DRESSINGS) IMPLANT
ELECT REM PT RETURN 9FT ADLT (ELECTROSURGICAL) ×3
ELECTRODE REM PT RTRN 9FT ADLT (ELECTROSURGICAL) ×1 IMPLANT
GAUZE SPONGE 4X4 16PLY XRAY LF (GAUZE/BANDAGES/DRESSINGS) IMPLANT
GAUZE VASELINE FOILPK 1/2 X 72 (GAUZE/BANDAGES/DRESSINGS) IMPLANT
GLOVE BIO SURGEON STRL SZ 6.5 (GLOVE) ×1 IMPLANT
GLOVE BIO SURGEONS STRL SZ 6.5 (GLOVE) ×1
GLOVE ECLIPSE 7.5 STRL STRAW (GLOVE) ×3 IMPLANT
GOWN STRL REUS W/ TWL LRG LVL3 (GOWN DISPOSABLE) ×2 IMPLANT
GOWN STRL REUS W/TWL LRG LVL3 (GOWN DISPOSABLE) ×6
HEMOSTAT SURGICEL .5X2 ABSORB (HEMOSTASIS) IMPLANT
IV NS 500ML (IV SOLUTION) ×3
IV NS 500ML BAXH (IV SOLUTION) IMPLANT
NDL PRECISIONGLIDE 27X1.5 (NEEDLE) ×1 IMPLANT
NDL SPNL 25GX3.5 QUINCKE BL (NEEDLE) IMPLANT
NEEDLE PRECISIONGLIDE 27X1.5 (NEEDLE) ×3 IMPLANT
NEEDLE SPNL 25GX3.5 QUINCKE BL (NEEDLE) IMPLANT
NS IRRIG 1000ML POUR BTL (IV SOLUTION) ×2 IMPLANT
PACK BASIN DAY SURGERY FS (CUSTOM PROCEDURE TRAY) ×3 IMPLANT
PACK ENT DAY SURGERY (CUSTOM PROCEDURE TRAY) ×3 IMPLANT
PAD DRESSING TELFA 3X8 NADH (GAUZE/BANDAGES/DRESSINGS) IMPLANT
PATTIES SURGICAL .5 X3 (DISPOSABLE) ×3 IMPLANT
SHEET SILASTIC 8X6X.030 25-30 (MISCELLANEOUS) IMPLANT
SLEEVE SCD COMPRESS KNEE MED (MISCELLANEOUS) ×3 IMPLANT
SOLUTION BUTLER CLEAR DIP (MISCELLANEOUS) ×3 IMPLANT
SPONGE GAUZE 2X2 8PLY STER LF (GAUZE/BANDAGES/DRESSINGS) ×1
SPONGE GAUZE 2X2 8PLY STRL LF (GAUZE/BANDAGES/DRESSINGS) ×2 IMPLANT
SPONGE SURGIFOAM ABS GEL 12-7 (HEMOSTASIS) IMPLANT
STRIP CLOSURE SKIN 1/2X4 (GAUZE/BANDAGES/DRESSINGS) IMPLANT
SUT CHROMIC 4 0 P 3 18 (SUTURE) ×3 IMPLANT
SUT ETHILON 3 0 PS 1 (SUTURE) IMPLANT
SUT ETHILON 4 0 CL P 3 (SUTURE) IMPLANT
SUT ETHILON 6 0 P 1 (SUTURE) IMPLANT
SUT PLAIN 4 0 ~~LOC~~ 1 (SUTURE) ×3 IMPLANT
TOWEL OR 17X24 6PK STRL BLUE (TOWEL DISPOSABLE) ×6 IMPLANT
TUBE CONNECTING 20'X1/4 (TUBING) ×1
TUBE CONNECTING 20X1/4 (TUBING) ×1 IMPLANT
YANKAUER SUCT BULB TIP NO VENT (SUCTIONS) ×3 IMPLANT

## 2017-01-04 NOTE — Anesthesia Procedure Notes (Signed)
Procedure Name: Intubation Date/Time: 01/04/2017 8:14 AM Performed by: Lyndee Leo Pre-anesthesia Checklist: Patient identified, Emergency Drugs available, Suction available and Patient being monitored Patient Re-evaluated:Patient Re-evaluated prior to inductionOxygen Delivery Method: Circle system utilized Preoxygenation: Pre-oxygenation with 100% oxygen Intubation Type: IV induction Ventilation: Mask ventilation without difficulty Laryngoscope Size: Glidescope Grade View: Grade III Tube type: Oral Rae Tube size: 8.0 mm Number of attempts: 1 Airway Equipment and Method: Stylet and Oral airway Placement Confirmation: ETT inserted through vocal cords under direct vision,  positive ETCO2 and breath sounds checked- equal and bilateral Secured at: 22 cm Tube secured with: Tape Dental Injury: Teeth and Oropharynx as per pre-operative assessment

## 2017-01-04 NOTE — Anesthesia Postprocedure Evaluation (Signed)
Anesthesia Post Note  Patient: Paul Humphrey  Procedure(s) Performed: Procedure(s) (LRB): NASAL SEPTOPLASTY WITH TURBINATE REDUCTION (Bilateral) ENDOSCOPIC SINUS SURGERY (Bilateral)  Patient location during evaluation: PACU Anesthesia Type: General Level of consciousness: awake and alert Pain management: pain level controlled Vital Signs Assessment: post-procedure vital signs reviewed and stable Respiratory status: spontaneous breathing, nonlabored ventilation, respiratory function stable and patient connected to nasal cannula oxygen Cardiovascular status: blood pressure returned to baseline and stable Postop Assessment: no signs of nausea or vomiting Anesthetic complications: no       Last Vitals:  Vitals:   01/04/17 1015 01/04/17 1030  BP: (!) 150/98 (!) 146/96  Pulse: 78 79  Resp: 15 12  Temp:      Last Pain:  Vitals:   01/04/17 1030  TempSrc:   PainSc: 3                  Catalina Gravel

## 2017-01-04 NOTE — Anesthesia Preprocedure Evaluation (Addendum)
Anesthesia Evaluation  Patient identified by MRN, date of birth, ID band Patient awake    Reviewed: Allergy & Precautions, NPO status , Patient's Chart, lab work & pertinent test results  Airway Mallampati: II  TM Distance: >3 FB Neck ROM: Full    Dental  (+) Teeth Intact, Dental Advisory Given, Caps,    Pulmonary neg pulmonary ROS,    Pulmonary exam normal breath sounds clear to auscultation       Cardiovascular negative cardio ROS Normal cardiovascular exam Rhythm:Regular Rate:Normal     Neuro/Psych  Headaches, negative psych ROS   GI/Hepatic Neg liver ROS, GERD  ,  Endo/Other  negative endocrine ROSObesity   Renal/GU negative Renal ROS     Musculoskeletal  (+) Arthritis , Osteoarthritis,    Abdominal   Peds  Hematology negative hematology ROS (+)   Anesthesia Other Findings Day of surgery medications reviewed with the patient.  Reproductive/Obstetrics                            Anesthesia Physical Anesthesia Plan  ASA: II  Anesthesia Plan: General   Post-op Pain Management:    Induction: Intravenous  Airway Management Planned: Oral ETT  Additional Equipment:   Intra-op Plan:   Post-operative Plan: Extubation in OR  Informed Consent: I have reviewed the patients History and Physical, chart, labs and discussed the procedure including the risks, benefits and alternatives for the proposed anesthesia with the patient or authorized representative who has indicated his/her understanding and acceptance.   Dental advisory given  Plan Discussed with: CRNA  Anesthesia Plan Comments: (Risks/benefits of general anesthesia discussed with patient including risk of damage to teeth, lips, gum, and tongue, nausea/vomiting, allergic reactions to medications, and the possibility of heart attack, stroke and death.  All patient questions answered.  Patient wishes to proceed.)         Anesthesia Quick Evaluation

## 2017-01-04 NOTE — Op Note (Signed)
OPERATIVE REPORT  DATE OF SURGERY: 01/04/2017  PATIENT:  Paul Humphrey,  31 y.o. male  PRE-OPERATIVE DIAGNOSIS:  CHRONIC ETHMOID MAXILLARY SINUSITIS,DEVIATED SEPTUM  POST-OPERATIVE DIAGNOSIS:  CHRONIC ETHMOID MAXILLARY SINUSITIS,DEVIATED SEPTUM  PROCEDURE:  Procedure(s): NASAL SEPTOPLASTY WITH TURBINATE REDUCTION ENDOSCOPIC SINUS SURGERY  SURGEON:  Beckie Salts, MD  ASSISTANTS: none  ANESTHESIA:   General   EBL:  200 ml  DRAINS: none  LOCAL MEDICATIONS USED:  1% Xylocaine with epinephrine  SPECIMEN:  Bilateral sinus contents  COUNTS:  Correct  PROCEDURE DETAILS: The patient was taken to the operating room and placed on the operating table in the supine position. Following induction of general endotracheal anesthesia, the nose was prepped and draped in a standard fashion. Afrin spray was used preoperatively in the holding area. 1% Xylocaine with epinephrine was infiltrated into the septum, the columella, and the inferior turbinates bilaterally.  1. Nasal septoplasty. A left hemitransfixion incision was created with a 15 scalpel. A mucoperichondrial flap was developed posteriorly down the left side of the nasal septum using a Cottle elevator. This was performed all the way to the sphenoid rostrum. The bony cartilaginous junction was divided and a similar flap was developed down the right side. The ethmoid plate was mostly resected. The maxillary crest was also resected as it was causing a large spur along the left anterior nasal cavity.  2. Submucous resection inferior turbinates bilaterally. The leading edge of the inferior turbinates were incised in a vertical fashion with a #15 scalpel. The Cottle elevator was used to elevate mucosa off bone in all directions. Fragments of the turbinate bone were resected with a Takahashi forceps. The turbinate remnants were outfractured with the Soil scientist.  3. Bilateral endoscopic total ethmoidectomy. Left side was dissected first followed by  the right. The procedure was the same on both sides. The first step was to resect the base of the uncinate process with a sickle knife. This was then removed exposing the bulla and the infundibular region. The microdebrider was then used to perform a complete ethmoid dissection superiorly to the fovea ethmoidalis, and laterally to the lamina papyracea, both of which were kept intact. There is some polypoid tissue found throughout about 50% of the ethmoid cells. The ground lamella was taken down to expose posterior cells. A complete ethmoid dissection was performed on both sides. Both sides were packed with half of a nasal poor packing coated with bacitracin ointment.  4. Bilateral maxillary antrostomy endoscopic. After the ethmoid dissection was completed the curved suction was used to enter into the fontanelle and expose the antrum bilaterally. The antrostomy was enlarged anteriorly using backbiting forceps and posteriorly using true cut forceps.  All of these maneuvers greatly increased the nasal airways bilaterally. The nasal cavities were packed with rolled up Telfa coated with bacitracin ointment. The pharynx was suctioned of blood and secretions under direct visualization. The patient was awakened extubated and transferred to recovery in stable condition.    PATIENT DISPOSITION:  To PACU, stable

## 2017-01-04 NOTE — Transfer of Care (Signed)
Immediate Anesthesia Transfer of Care Note  Patient: Paul Humphrey  Procedure(s) Performed: Procedure(s): NASAL SEPTOPLASTY WITH TURBINATE REDUCTION (Bilateral) ENDOSCOPIC SINUS SURGERY (Bilateral)  Patient Location: PACU  Anesthesia Type:General  Level of Consciousness: awake, patient cooperative and confused  Airway & Oxygen Therapy: Patient Spontanous Breathing and aerosol face mask  Post-op Assessment: Report given to RN and Post -op Vital signs reviewed and stable  Post vital signs: Reviewed and stable  Last Vitals:  Vitals:   01/04/17 0657  BP: 133/88  Pulse: 74  Resp: 20  Temp: 36.7 C    Last Pain:  Vitals:   01/04/17 0657  TempSrc: Oral         Complications: No apparent anesthesia complications

## 2017-01-04 NOTE — Discharge Instructions (Signed)

## 2017-01-04 NOTE — Interval H&P Note (Signed)
History and Physical Interval Note:  01/04/2017 7:46 AM  Paul Humphrey  has presented today for surgery, with the diagnosis of CHRONIC ETHMOID MAXILLARY SINUSITIS,DEVIATED SEPTUM  The various methods of treatment have been discussed with the patient and family. After consideration of risks, benefits and other options for treatment, the patient has consented to  Procedure(s): NASAL SEPTOPLASTY WITH TURBINATE REDUCTION (Bilateral) ENDOSCOPIC SINUS SURGERY (Bilateral) as a surgical intervention .  The patient's history has been reviewed, patient examined, no change in status, stable for surgery.  I have reviewed the patient's chart and labs.  Questions were answered to the patient's satisfaction.     [name]

## 2017-02-03 ENCOUNTER — Encounter: Payer: Self-pay | Admitting: Family Medicine

## 2017-02-03 ENCOUNTER — Ambulatory Visit (INDEPENDENT_AMBULATORY_CARE_PROVIDER_SITE_OTHER): Payer: BLUE CROSS/BLUE SHIELD | Admitting: Family Medicine

## 2017-02-03 VITALS — BP 116/68 | HR 95 | Temp 98.6°F | Resp 16 | Ht 74.0 in | Wt 263.0 lb

## 2017-02-03 DIAGNOSIS — J329 Chronic sinusitis, unspecified: Secondary | ICD-10-CM | POA: Diagnosis not present

## 2017-02-03 MED ORDER — CLINDAMYCIN HCL 300 MG PO CAPS: 300.0000 mg | ORAL_CAPSULE | Freq: Three times a day (TID) | ORAL | 0 refills | Status: DC

## 2017-02-03 NOTE — Patient Instructions (Addendum)
It was good to see you today.  Take the Clindamycin three times a day for the next week.  Let us know if you're not feeling better by then.  If you start feeling worse despite the medicine, don't wait and come back sooner.  Have a good rest of the week    IF you received an x-ray today, you will receive an invoice from [facility] Rad[facility]. Please contact [facility] at [phone] with questions or concerns regarding your invoice.   IF you received labwork today, you will receive an invoice from [facility]. Please contact [facility] at [phone] with questions or concerns regarding your invoice.   Our billing staff will not be able to assist you with questions regarding bills from these companies.  You will be contacted with the lab results as soon as they are available. The fastest way to get your results is to activate your My Chart account. Instructions are located on the last page of this paperwork. If you have not heard from [place] regarding the results in 2 weeks, please contact this office.

## 2017-02-03 NOTE — Progress Notes (Signed)
   SUBJECTIVE: URI symptoms:  Paul Humphrey is a 31 y.o. male who complains of sinus congestion for the past 2 weeks.  He has a strong history of multiple sinus infections per year.  Had sinus surgery just last month with ENT.  This is his first infection since then, feels it is "residual" from before the surgery.  Reports having a bone removed from Left nostril that was blocking frontal sinus as well as multiple polyps BL.  Felt it was "much easier to breathe" s/p surgery.    Currently, with facial pain, sinus congestion.  Some throat irritation, can feel mucus draining.  Describes thick brown and green mucus production when blowing his nose.    Has been tried on Zpak, Augmentin, Clindamycin in past.  Amoxicilllin by itself does not help.  He has tried Neti pot and OTC decongestants without relief at home.  No fevers or chills.  General malaise.    PMH:  Nonsmoker.   ROS as above.    PMH reviewed. Patient is a nonsmoker.   Medications reviewed.  Physical Exam:  BP 116/68   Pulse 95   Temp 98.6 F (37 C) (Oral)   Resp 16   Ht 6\' 2"  (1.88 m)   Wt 263 lb (119.3 kg)   SpO2 97%   BMI 33.77 kg/m  Gen:  Patient sitting on exam table, appears stated age in no acute distress Head: Normocephalic atraumatic Eyes: EOMI, PERRL, sclera and conjunctiva non-erythematous Ears:  Canals clear bilaterally.  TMs pearly gray bilaterally without erythema or bulging.   Nose:  Nasal turbinates grossly enlarged bilaterally. Some exudates noted Left nostril. Tender to palpation of maxillary sinus  Mouth: Mucosa membranes moist. Tonsils +2, nonenlarged, non-erythematous. Neck: No cervical lymphadenopathy noted Heart:  RRR, no murmurs auscultated. Pulm:  Clear to auscultation bilaterally with good air movement.  No wheezes or rales noted.    Assessment and Plan:  1.  Sinus infection: - recurrent - last treated with clinda which helped. - plan re-treatment with same today.  - Warning/return precautions  provided.   - FU with ENT if continues to recur

## 2017-05-31 ENCOUNTER — Encounter: Payer: Self-pay | Admitting: Physician Assistant

## 2017-05-31 ENCOUNTER — Ambulatory Visit (INDEPENDENT_AMBULATORY_CARE_PROVIDER_SITE_OTHER): Payer: BLUE CROSS/BLUE SHIELD | Admitting: Physician Assistant

## 2017-05-31 VITALS — BP 136/80 | HR 105 | Temp 98.4°F | Resp 16 | Ht 74.0 in | Wt 264.0 lb

## 2017-05-31 DIAGNOSIS — J329 Chronic sinusitis, unspecified: Secondary | ICD-10-CM

## 2017-05-31 DIAGNOSIS — J0140 Acute pansinusitis, unspecified: Secondary | ICD-10-CM

## 2017-05-31 MED ORDER — GUAIFENESIN ER 1200 MG PO TB12: 1.0000 | ORAL_TABLET | Freq: Two times a day (BID) | ORAL | 1 refills | Status: DC | PRN

## 2017-05-31 MED ORDER — BENZONATATE 100 MG PO CAPS: 100.0000 mg | ORAL_CAPSULE | Freq: Three times a day (TID) | ORAL | 0 refills | Status: DC | PRN

## 2017-05-31 MED ORDER — AMOXICILLIN-POT CLAVULANATE 875-125 MG PO TABS: 1.0000 | ORAL_TABLET | Freq: Two times a day (BID) | ORAL | 0 refills | Status: AC

## 2017-05-31 NOTE — Progress Notes (Signed)
PRIMARY CARE AT [facility] [address] [place] [address] [phone]  Date:  05/31/2017   Name:  Paul Humphrey   DOB:  [birth year]   MRN:  [id number]  PCP:  Wendie Agreste, MD    History of Present Illness:  Paul Humphrey is a 31 y.o. male patient who presents to PCP with  Chief Complaint  Patient presents with  . Sinusitis    x 3 days  . Cough    x 3 days     3 weeks ago, post nasal drainage.   He has progressively worsening symptoms of facial pain at his cheeks and forehead.  3 days, he has violent coughs.  No sob or dyspnea.  Mucus from the nose is thick and green.  Subjective fever and chills.   He did sinus rinses, prone to infection.  Recent rhinoplasty 6 months ago. He used some old tessalon pearls, which helped.    Patient Active Problem List   Diagnosis Date Noted  . Pain in both hands 11/23/2016  . Great toe pain, left 11/23/2016  . GERD (gastroesophageal reflux disease) 03/18/2015  . Abdominal pain 01/07/2015  . Tingling 01/07/2015  . Suspected exposure to heavy metals 01/07/2015  . Vitamin D deficiency 01/07/2015    Past Medical History:  Diagnosis Date  . Arthritis of big toe    left  . Bulging of cervical intervertebral disc   . Chronic ethmoidal sinusitis 12/2016  . Chronic maxillary sinusitis 12/2016  . Dental crowns present   . Deviated nasal septum 12/2016  . Family history of adverse reaction to anesthesia    pt's mother has hx. of being hard to wake up post-op  . Lip laceration 12/28/2016   no stitches required, per pt.  . 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 Sinus headache     Past Surgical History:  Procedure Laterality Date  . CHOLECYSTECTOMY  2016  . COLECTOMY  2013  . COLOSTOMY  2013  . COLOSTOMY REVERSAL  2014  . LEG SURGERY Left 08/12/2005   lateral compartment release  . NASAL SEPTOPLASTY W/ TURBINOPLASTY Bilateral 01/04/2017   Procedure: NASAL SEPTOPLASTY WITH TURBINATE REDUCTION;  Surgeon: Izora Gala,  MD;  Location: [facility];  Service: ENT;  Laterality: Bilateral;  . NASAL SINUS SURGERY Bilateral 01/04/2017   Procedure: ENDOSCOPIC SINUS SURGERY;  Surgeon: Izora Gala, MD;  Location: [facility];  Service: ENT;  Laterality: Bilateral;  . UMBILICAL HERNIA REPAIR  2016   x 2  . URETERAL STENT PLACEMENT  2014    Social History  Substance Use Topics  . Smoking status: Never Smoker  . Smokeless tobacco: Never Used  . Alcohol use 0.0 oz/week     Comment: socially    Family History  Problem Relation Age of Onset  . Breast cancer Mother   . Arthritis Mother   . Anesthesia problems Mother        hard to wake up post-op  . Arthritis Father   . Diabetes Maternal Grandmother   . Hypertension Maternal Grandmother   . Diabetes Paternal Grandmother   . Diabetes Paternal Grandfather   . Diabetes Paternal Aunt     Allergies  Allergen Reactions  . Gadolinium Derivatives Nausea And Vomiting  . Ivp Dye [Iodinated Diagnostic Agents] Nausea Only  . Lactose Intolerance (Gi) Other (See Comments)    GI UPSET  . Percocet [Oxycodone-Acetaminophen] Other (See Comments)    "SKIN CRAWLING" SENSATION    Medication list  has been reviewed and updated.  Current Outpatient Prescriptions on File Prior to Visit  Medication Sig Dispense Refill  . cholecalciferol (VITAMIN D) 1000 UNITS tablet Take 1,000 Units by mouth daily.    Marland Kitchen loratadine (CLARITIN) 10 MG tablet Take 10 mg by mouth daily.    . Multiple Vitamins-Minerals (MULTIVITAMIN ADULT PO) Take 1 tablet by mouth daily. Reported on 01/18/2016     No current facility-administered medications on file prior to visit.     ROS ROS otherwise unremarkable unless listed above.  Physical Examination: BP 136/80   Pulse (!) 105   Temp 98.4 F (36.9 C) (Oral)   Resp 16   Ht 6\' 2"  (1.88 m)   Wt 264 lb (119.7 kg)   SpO2 98%   BMI 33.90 kg/m  Ideal Body Weight: Weight in (lb) to have BMI = 25: 194.3  Physical Exam   Constitutional: He is oriented to person, place, and time. He appears well-developed and well-nourished. No distress.  HENT:  Head: Atraumatic.  Right Ear: Tympanic membrane, external ear and ear canal normal.  Left Ear: Tympanic membrane, external ear and ear canal normal.  Nose: Mucosal edema and rhinorrhea present. Right sinus exhibits maxillary sinus tenderness and frontal sinus tenderness. Left sinus exhibits maxillary sinus tenderness and frontal sinus tenderness.  Mouth/Throat: No uvula swelling. No oropharyngeal exudate, posterior oropharyngeal edema or posterior oropharyngeal erythema.  Eyes: Pupils are equal, round, and reactive to light. Conjunctivae, EOM and lids are normal. Right eye exhibits normal extraocular motion. Left eye exhibits normal extraocular motion.  Neck: Trachea normal and full passive range of motion without pain. No edema and no erythema present.  Cardiovascular: Normal rate.   Pulmonary/Chest: Effort normal. No respiratory distress. He has no decreased breath sounds. He has no wheezes. He has no rhonchi.  Neurological: He is alert and oriented to person, place, and time.  Skin: Skin is warm and dry. He is not diaphoretic.  Psychiatric: He has a normal mood and affect. His behavior is normal.     Assessment and Plan: Paul Humphrey is a 31 y.o. male who is here today for cc of  Chief Complaint  Patient presents with  . Sinusitis    x 3 days  . Cough    x 3 days   Subacute pansinusitis - Plan: benzonatate (TESSALON) 100 MG capsule, amoxicillin-clavulanate (AUGMENTIN) 875-125 MG tablet, Guaifenesin (MUCINEX MAXIMUM STRENGTH) 1200 MG TB12  Paul Drape, PA-C [facility] 10/9/201811:00 AM

## 2017-05-31 NOTE — Patient Instructions (Addendum)
Continue to hydrate well with 64 oz per day or MORE Please take the allergy medication daily as you are doing Take antibiotic as discussed.    Sinusitis, Adult Sinusitis is soreness and inflammation of your sinuses. Sinuses are hollow spaces in the bones around your face. They are located:  Around your eyes.  In the middle of your forehead.  Behind your nose.  In your cheekbones.  Your sinuses and nasal passages are lined with a stringy fluid (mucus). Mucus normally drains out of your sinuses. When your nasal tissues get inflamed or swollen, the mucus can get trapped or blocked so air cannot flow through your sinuses. This lets bacteria, viruses, and funguses grow, and that leads to infection. Follow these instructions at home: Medicines  Take, use, or apply over-the-counter and prescription medicines only as told by your doctor. These may include nasal sprays.  If you were prescribed an antibiotic medicine, take it as told by your doctor. Do not stop taking the antibiotic even if you start to feel better. Hydrate and Humidify  Drink enough water to keep your pee (urine) clear or pale yellow.  Use a cool mist humidifier to keep the humidity level in your home above 50%.  Breathe in steam for 10-15 minutes, 3-4 times a day or as told by your doctor. You can do this in the bathroom while a hot shower is running.  Try not to spend time in cool or dry air. Rest  Rest as much as possible.  Sleep with your head raised (elevated).  Make sure to get enough sleep each night. General instructions  Put a warm, moist washcloth on your face 3-4 times a day or as told by your doctor. This will help with discomfort.  Wash your hands often with soap and water. If there is no soap and water, use hand sanitizer.  Do not smoke. Avoid being around people who are smoking (secondhand smoke).  Keep all follow-up visits as told by your doctor. This is important. Contact a doctor if:  You  have a fever.  Your symptoms get worse.  Your symptoms do not get better within 10 days. Get help right away if:  You have a very bad headache.  You cannot stop throwing up (vomiting).  You have pain or swelling around your face or eyes.  You have trouble seeing.  You feel confused.  Your neck is stiff.  You have trouble breathing. This information is not intended to replace advice given to you by your health care provider. Make sure you discuss any questions you have with your health care provider. Document Released: 01/27/2008 Document Revised: 04/05/2016 Document Reviewed: 06/05/2015 Elsevier Interactive Patient Education  2018 [employer].     IF you received an x-ray today, you will receive an invoice from [facility] Radiology. Please contact [facility] Radiology at [phone] with questions or concerns regarding your invoice.   IF you received labwork today, you will receive an invoice from [facility]. Please contact [facility] at [phone] with questions or concerns regarding your invoice.   Our billing staff will not be able to assist you with questions regarding bills from these companies.  You will be contacted with the lab results as soon as they are available. The fastest way to get your results is to activate your My Chart account. Instructions are located on the last page of this paperwork. If you have not heard from [place] regarding the results in 2 weeks, please contact this office.

## 2017-06-17 ENCOUNTER — Ambulatory Visit (INDEPENDENT_AMBULATORY_CARE_PROVIDER_SITE_OTHER): Payer: BLUE CROSS/BLUE SHIELD | Admitting: Physician Assistant

## 2017-06-17 ENCOUNTER — Ambulatory Visit (INDEPENDENT_AMBULATORY_CARE_PROVIDER_SITE_OTHER): Payer: BLUE CROSS/BLUE SHIELD

## 2017-06-17 VITALS — BP 122/80 | HR 81 | Temp 98.4°F | Resp 16 | Ht 74.0 in | Wt 262.0 lb

## 2017-06-17 DIAGNOSIS — R0600 Dyspnea, unspecified: Secondary | ICD-10-CM | POA: Diagnosis not present

## 2017-06-17 DIAGNOSIS — R059 Cough, unspecified: Secondary | ICD-10-CM

## 2017-06-17 DIAGNOSIS — R05 Cough: Secondary | ICD-10-CM

## 2017-06-17 DIAGNOSIS — J988 Other specified respiratory disorders: Secondary | ICD-10-CM | POA: Diagnosis not present

## 2017-06-17 LAB — POCT CBC
GRANULOCYTE PERCENT: 75.6 % (ref 37–80)
HEMATOCRIT: 51.7 % (ref 43.5–53.7)
HEMOGLOBIN: 17.1 g/dL (ref 14.1–18.1)
Lymph, poc: 1.5 (ref 0.6–3.4)
MCH: 29.8 pg (ref 27–31.2)
MCHC: 33.1 g/dL (ref 31.8–35.4)
MCV: 89.9 fL (ref 80–97)
MID (cbc): 0.1 (ref 0–0.9)
MPV: 7.9 fL (ref 0–99.8)
POC GRANULOCYTE: 4.8 (ref 2–6.9)
POC LYMPH PERCENT: 22.7 %L (ref 10–50)
POC MID %: 1.7 %M (ref 0–12)
Platelet Count, POC: 187 10*3/uL (ref 142–424)
RBC: 5.75 M/uL (ref 4.69–6.13)
RDW, POC: 13.3 %
WBC: 6.4 10*3/uL (ref 4.6–10.2)

## 2017-06-17 MED ORDER — PREDNISONE 20 MG PO TABS: 40.0000 mg | ORAL_TABLET | Freq: Every day | ORAL | 0 refills | Status: DC

## 2017-06-17 NOTE — Patient Instructions (Addendum)
Continue the mucinex. Make sure you are hydrating well with 64 oz of water if not more.   Acute bronchitis is when air tubes (bronchi) in the lungs suddenly get swollen. The condition can make it hard to breathe. It can also cause these symptoms:  A cough.  Coughing up clear, yellow, or green mucus.  Wheezing.  Chest congestion.  Shortness of breath.  A fever.  Body aches.  Chills.  A sore throat.  Follow these instructions at home: Medicines  Take over-the-counter and prescription medicines only as told by your doctor.  If you were prescribed an antibiotic medicine, take it as told by your doctor. Do not stop taking the antibiotic even if you start to feel better. General instructions  Rest.  Drink enough fluids to keep your pee (urine) clear or pale yellow.  Avoid smoking and secondhand smoke. If you smoke and you need help quitting, ask your doctor. Quitting will help your lungs heal faster.  Use an inhaler, cool mist vaporizer, or humidifier as told by your doctor.  Keep all follow-up visits as told by your doctor. This is important. How is this prevented? To lower your risk of getting this condition again:  Wash your hands often with soap and water. If you cannot use soap and water, use hand sanitizer.  Avoid contact with people who have cold symptoms.  Try not to touch your hands to your mouth, nose, or eyes.  Make sure to get the flu shot every year.  Contact a doctor if:  Your symptoms do not get better in 2 weeks. Get help right away if:  You cough up blood.  You have chest pain.  You have very bad shortness of breath.  You become dehydrated.  You faint (pass out) or keep feeling like you are going to pass out.  You keep throwing up (vomiting).  You have a very bad headache.  Your fever or chills gets worse. This information is not intended to replace advice given to you by your health care provider. Make sure you discuss any questions  you have with your health care provider. Document Released: 01/27/2008 Document Revised: 03/18/2016 Document Reviewed: 01/29/2016 Elsevier Interactive Patient Education  2017 [employer].

## 2017-06-17 NOTE — Progress Notes (Signed)
PRIMARY CARE AT [facility] [address] [place] [address] [phone]  Date:  06/17/2017   Name:  Paul Humphrey   DOB:  29-Aug-1985   MRN:  [id number]  PCP:  Wendie Agreste, MD    History of Present Illness:  Paul Humphrey is a 31 y.o. male patient who presents to PCP with  Chief Complaint  Patient presents with  . Follow-up    sinus infection / feels better but has a cough  . Flank Pain    pt states he has side pain kidney pain/ x 2 months     Patient reports that he has improvement of his sinus inflammation.  Patient was seen here 2.5 weeks ago for pansinusitis.  Hx of chronic sinus infections and nasal sinus surgery.  He reports that there is a cough that continues to worsen.  He had a fit of coughing that caused some dyspnea, and emesis.   He has some flank pain secondary to coughing.  He notes no fever.  No sob or dyspnea, besides the one episode.  Cough is non-productive.   Results for orders placed or performed in visit on 06/17/17  POCT CBC  Result Value Ref Range   WBC 6.4 4.6 - 10.2 K/uL   Lymph, poc 1.5 0.6 - 3.4   POC LYMPH PERCENT 22.7 10 - 50 %L   MID (cbc) 0.1 0 - 0.9   POC MID % 1.7 0 - 12 %M   POC Granulocyte 4.8 2 - 6.9   Granulocyte percent 75.6 37 - 80 %G   RBC 5.75 4.69 - 6.13 M/uL   Hemoglobin 17.1 14.1 - 18.1 g/dL   HCT, POC 51.7 43.5 - 53.7 %   MCV 89.9 80 - 97 fL   MCH, POC 29.8 27 - 31.2 pg   MCHC 33.1 31.8 - 35.4 g/dL   RDW, POC 13.3 %   Platelet Count, POC 187 142 - 424 K/uL   MPV 7.9 0 - 99.8 fL   Dg Chest 2 View  Result Date: 06/17/2017 CLINICAL DATA:  Cough and dyspnea EXAM: CHEST  2 VIEW COMPARISON:  May 16, 2016 lungs are clear. Heart size and pulmonary vascularity are normal. No adenopathy. No bone lesions. FINDINGS: No edema or consolidation. IMPRESSION: No active cardiopulmonary disease. Electronically Signed   By: Lowella Grip III M.D.   On: 06/17/2017 11:59     Patient Active Problem List   Diagnosis Date Noted  . Pain  in both hands 11/23/2016  . Great toe pain, left 11/23/2016  . GERD (gastroesophageal reflux disease) 03/18/2015  . Abdominal pain 01/07/2015  . Tingling 01/07/2015  . Suspected exposure to heavy metals 01/07/2015  . Vitamin D deficiency 01/07/2015    Past Medical History:  Diagnosis Date  . Arthritis of big toe    left  . Bulging of cervical intervertebral disc   . Chronic ethmoidal sinusitis 12/2016  . Chronic maxillary sinusitis 12/2016  . Dental crowns present   . Deviated nasal septum 12/2016  . Family history of adverse reaction to anesthesia    pt's mother has hx. of being hard to wake up post-op  . Lip laceration 12/28/2016   no stitches required, per pt.  . 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 Sinus headache     Past Surgical History:  Procedure Laterality Date  . CHOLECYSTECTOMY  2016  . COLECTOMY  2013  . COLOSTOMY  2013  . COLOSTOMY REVERSAL  2014  . LEG  SURGERY Left 08/12/2005   lateral compartment release  . NASAL SEPTOPLASTY W/ TURBINOPLASTY Bilateral 01/04/2017   Procedure: NASAL SEPTOPLASTY WITH TURBINATE REDUCTION;  Surgeon: Izora Gala, MD;  Location: [facility];  Service: ENT;  Laterality: Bilateral;  . NASAL SINUS SURGERY Bilateral 01/04/2017   Procedure: ENDOSCOPIC SINUS SURGERY;  Surgeon: Izora Gala, MD;  Location: [facility];  Service: ENT;  Laterality: Bilateral;  . UMBILICAL HERNIA REPAIR  2016   x 2  . URETERAL STENT PLACEMENT  2014    Social History  Substance Use Topics  . Smoking status: Never Smoker  . Smokeless tobacco: Never Used  . Alcohol use 0.0 oz/week     Comment: socially    Family History  Problem Relation Age of Onset  . Breast cancer Mother   . Arthritis Mother   . Anesthesia problems Mother        hard to wake up post-op  . Arthritis Father   . Diabetes Maternal Grandmother   . Hypertension Maternal Grandmother   . Diabetes Paternal Grandmother   . Diabetes  Paternal Grandfather   . Diabetes Paternal Aunt     Allergies  Allergen Reactions  . Gadolinium Derivatives Nausea And Vomiting  . Ivp Dye [Iodinated Diagnostic Agents] Nausea Only  . Lactose Intolerance (Gi) Other (See Comments)    GI UPSET  . Percocet [Oxycodone-Acetaminophen] Other (See Comments)    "SKIN CRAWLING" SENSATION    Medication list has been reviewed and updated.  Current Outpatient Prescriptions on File Prior to Visit  Medication Sig Dispense Refill  . benzonatate (TESSALON) 100 MG capsule Take 1-2 capsules (100-200 mg total) by mouth 3 (three) times daily as needed for cough. 40 capsule 0  . cholecalciferol (VITAMIN D) 1000 UNITS tablet Take 1,000 Units by mouth daily.    . Guaifenesin (MUCINEX MAXIMUM STRENGTH) 1200 MG TB12 Take 1 tablet (1,200 mg total) by mouth every 12 (twelve) hours as needed. 14 tablet 1  . loratadine (CLARITIN) 10 MG tablet Take 10 mg by mouth daily.    . Multiple Vitamins-Minerals (MULTIVITAMIN ADULT PO) Take 1 tablet by mouth daily. Reported on 01/18/2016     No current facility-administered medications on file prior to visit.     ROS ROS otherwise unremarkable unless listed above.  Physical Examination: BP 122/80   Pulse 81   Temp 98.4 F (36.9 C) (Oral)   Resp 16   Ht 6\' 2"  (1.88 m)   Wt 262 lb (118.8 kg)   SpO2 99%   BMI 33.64 kg/m  Ideal Body Weight: Weight in (lb) to have BMI = 25: 194.3  Physical Exam  Constitutional: He is oriented to person, place, and time. He appears well-developed and well-nourished. No distress.  HENT:  Head: Normocephalic and atraumatic.  Right Ear: Tympanic membrane, external ear and ear canal normal.  Left Ear: Tympanic membrane, external ear and ear canal normal.  Nose: Mucosal edema present. Right sinus exhibits no maxillary sinus tenderness and no frontal sinus tenderness. Left sinus exhibits no maxillary sinus tenderness and no frontal sinus tenderness.  Mouth/Throat: No uvula swelling. No  oropharyngeal exudate, posterior oropharyngeal edema or posterior oropharyngeal erythema.  Eyes: Pupils are equal, round, and reactive to light. Conjunctivae, EOM and lids are normal. Right eye exhibits normal extraocular motion. Left eye exhibits normal extraocular motion.  Neck: Trachea normal and full passive range of motion without pain. No edema and no erythema present.  Cardiovascular: Normal rate.   Pulmonary/Chest: Effort normal. No  respiratory distress. He has no decreased breath sounds. He has no wheezes. He has no rhonchi.  Neurological: He is alert and oriented to person, place, and time.  Skin: Skin is warm and dry. He is not diaphoretic.  Psychiatric: He has a normal mood and affect. His behavior is normal.    Results for orders placed or performed in visit on 06/17/17  POCT CBC  Result Value Ref Range   WBC 6.4 4.6 - 10.2 K/uL   Lymph, poc 1.5 0.6 - 3.4   POC LYMPH PERCENT 22.7 10 - 50 %L   MID (cbc) 0.1 0 - 0.9   POC MID % 1.7 0 - 12 %M   POC Granulocyte 4.8 2 - 6.9   Granulocyte percent 75.6 37 - 80 %G   RBC 5.75 4.69 - 6.13 M/uL   Hemoglobin 17.1 14.1 - 18.1 g/dL   HCT, POC 51.7 43.5 - 53.7 %   MCV 89.9 80 - 97 fL   MCH, POC 29.8 27 - 31.2 pg   MCHC 33.1 31.8 - 35.4 g/dL   RDW, POC 13.3 %   Platelet Count, POC 187 142 - 424 K/uL   MPV 7.9 0 - 99.8 fL    Dg Chest 2 View  Result Date: 06/17/2017 CLINICAL DATA:  Cough and dyspnea EXAM: CHEST  2 VIEW COMPARISON:  May 16, 2016 lungs are clear. Heart size and pulmonary vascularity are normal. No adenopathy. No bone lesions. FINDINGS: No edema or consolidation. IMPRESSION: No active cardiopulmonary disease. Electronically Signed   By: Lowella Grip III M.D.   On: 06/17/2017 11:59    Assessment and Plan: Paul Humphrey is a 31 y.o. male who is here today for cc of  Chief Complaint  Patient presents with  . Follow-up    sinus infection / feels better but has a cough  . Flank Pain    pt states he has side pain  kidney pain/ x 2 months  less bacterial related, and possible bronchitis.  Given prednisone taper, and cough supportive treatment Rtc as needed. Respiratory infection - Plan: predniSONE (DELTASONE) 20 MG tablet  Cough - Plan: POCT CBC, DG Chest 2 View, predniSONE (DELTASONE) 20 MG tablet  Dyspnea, unspecified type - Plan: POCT CBC, DG Chest 2 View, predniSONE (DELTASONE) 20 MG tablet  Ivar Drape, PA-C [facility] 10/28/201812:36 PM

## 2017-06-20 ENCOUNTER — Encounter: Payer: Self-pay | Admitting: Physician Assistant

## 2017-06-30 NOTE — Telephone Encounter (Signed)
done

## 2017-07-09 DIAGNOSIS — M25512 Pain in left shoulder: Secondary | ICD-10-CM | POA: Diagnosis not present

## 2017-08-04 IMAGING — DX DG ANKLE COMPLETE 3+V*L*
3 series · 3 of 3 positions shown · non-contrast
Comparison: None.

CLINICAL DATA: Status post fall yesterday morning with a blow to
the left ankle. Pain and limited range of motion. Initial encounter.

EXAM:
LEFT ANKLE COMPLETE - 3+ VIEW

[ankle ap]
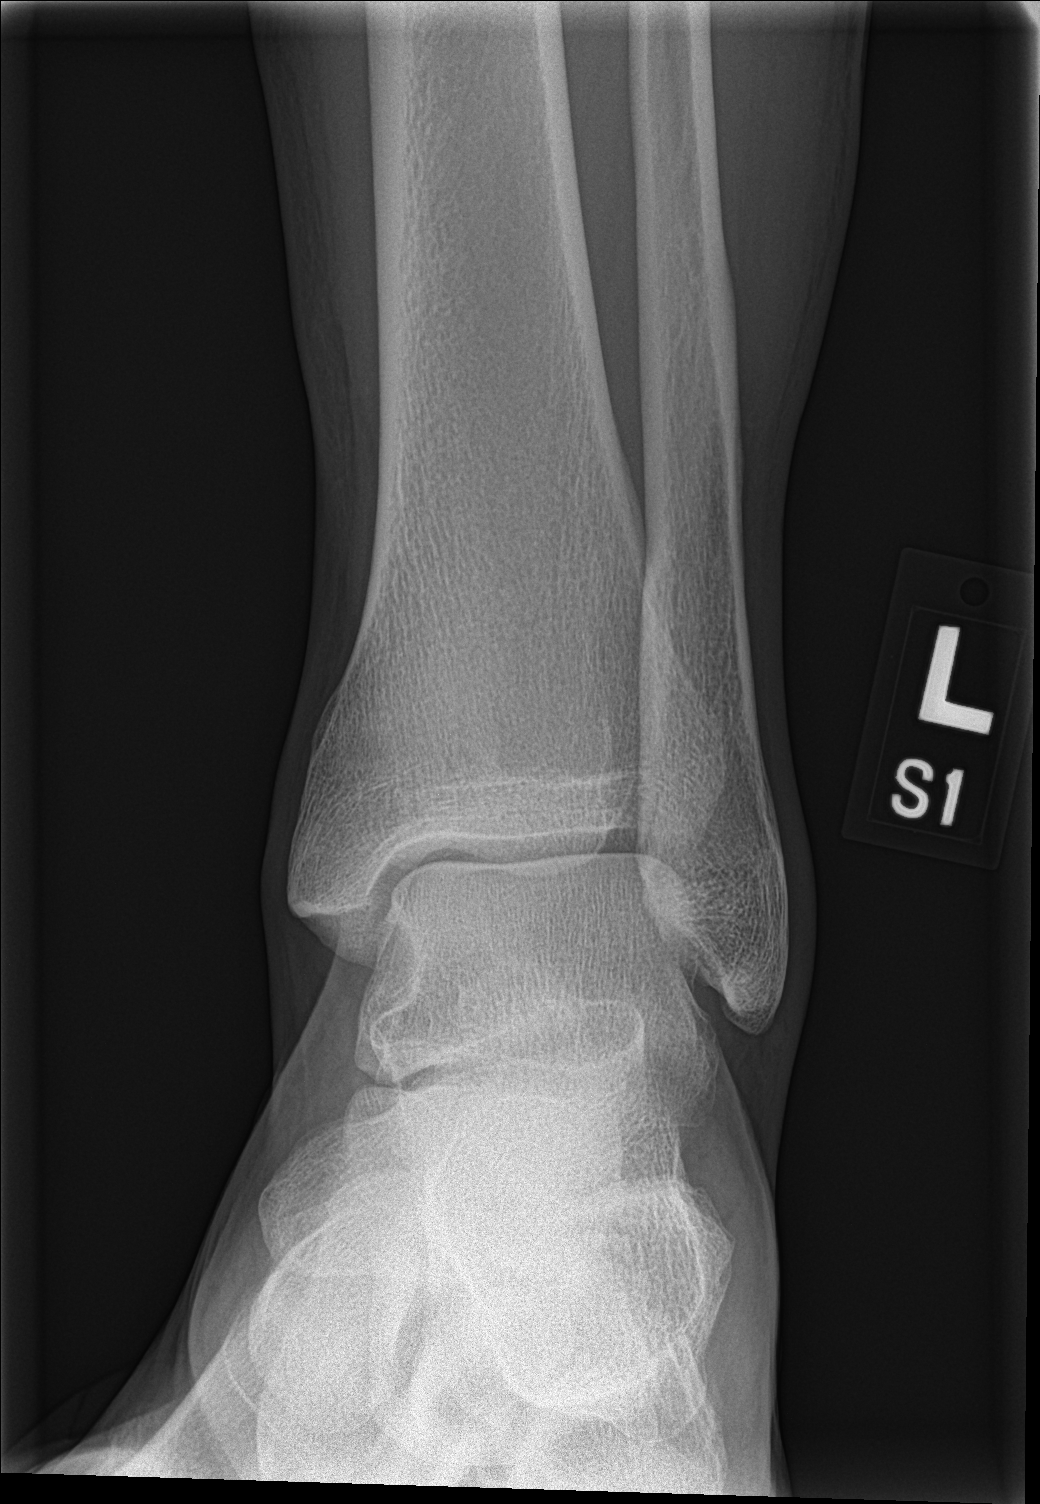

[ankle obl]
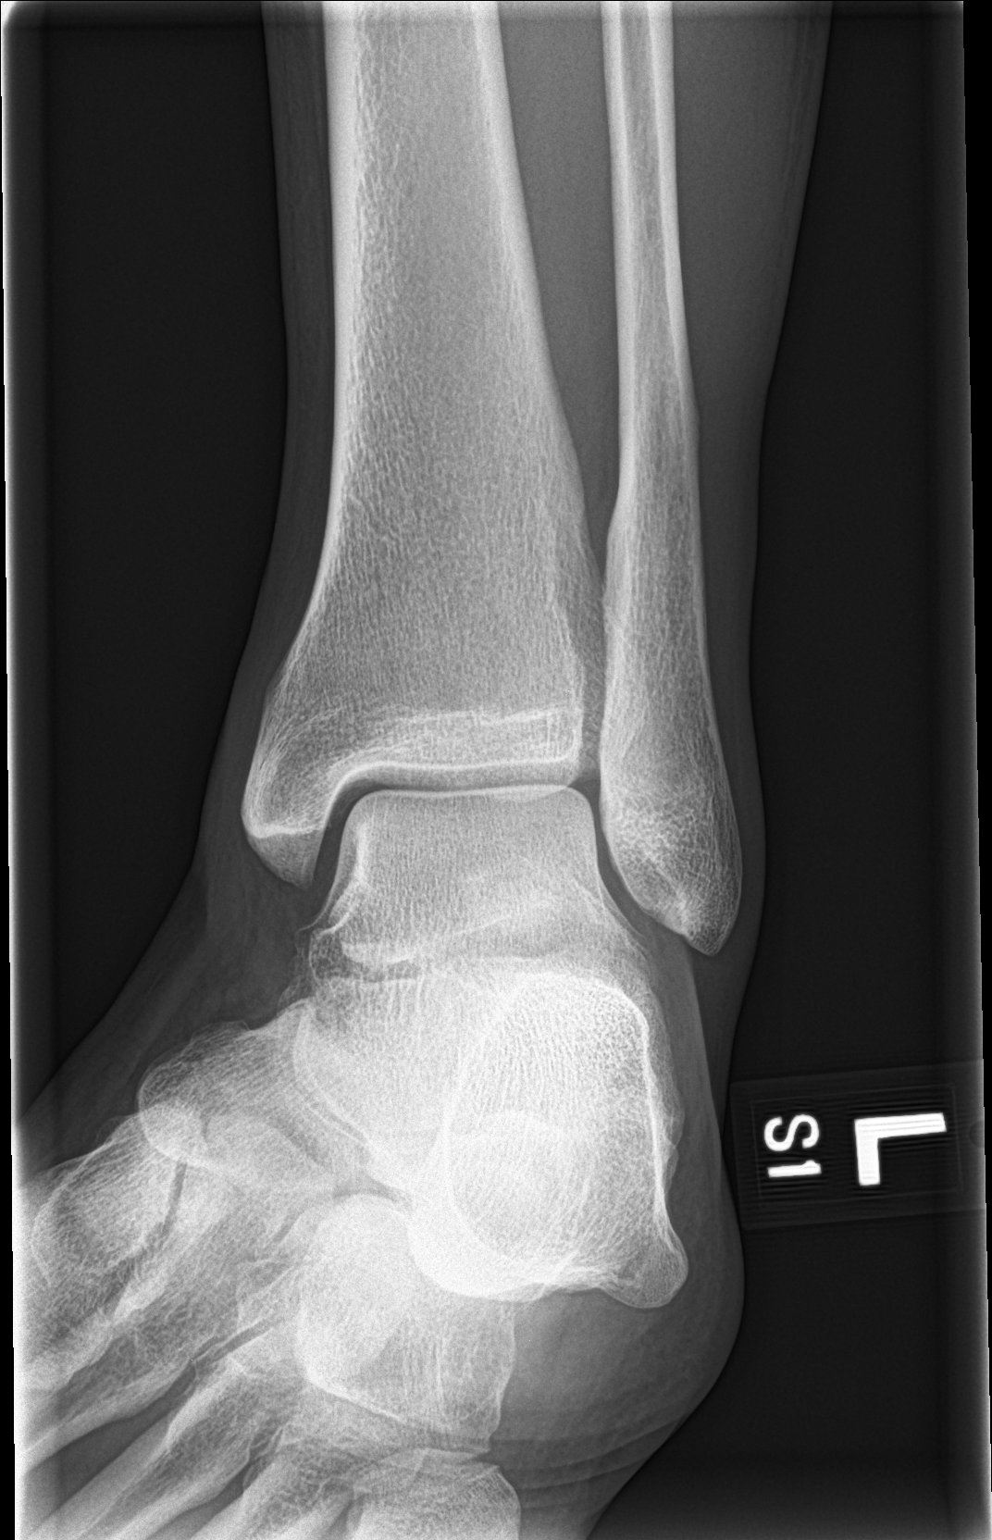

[ankle lat]
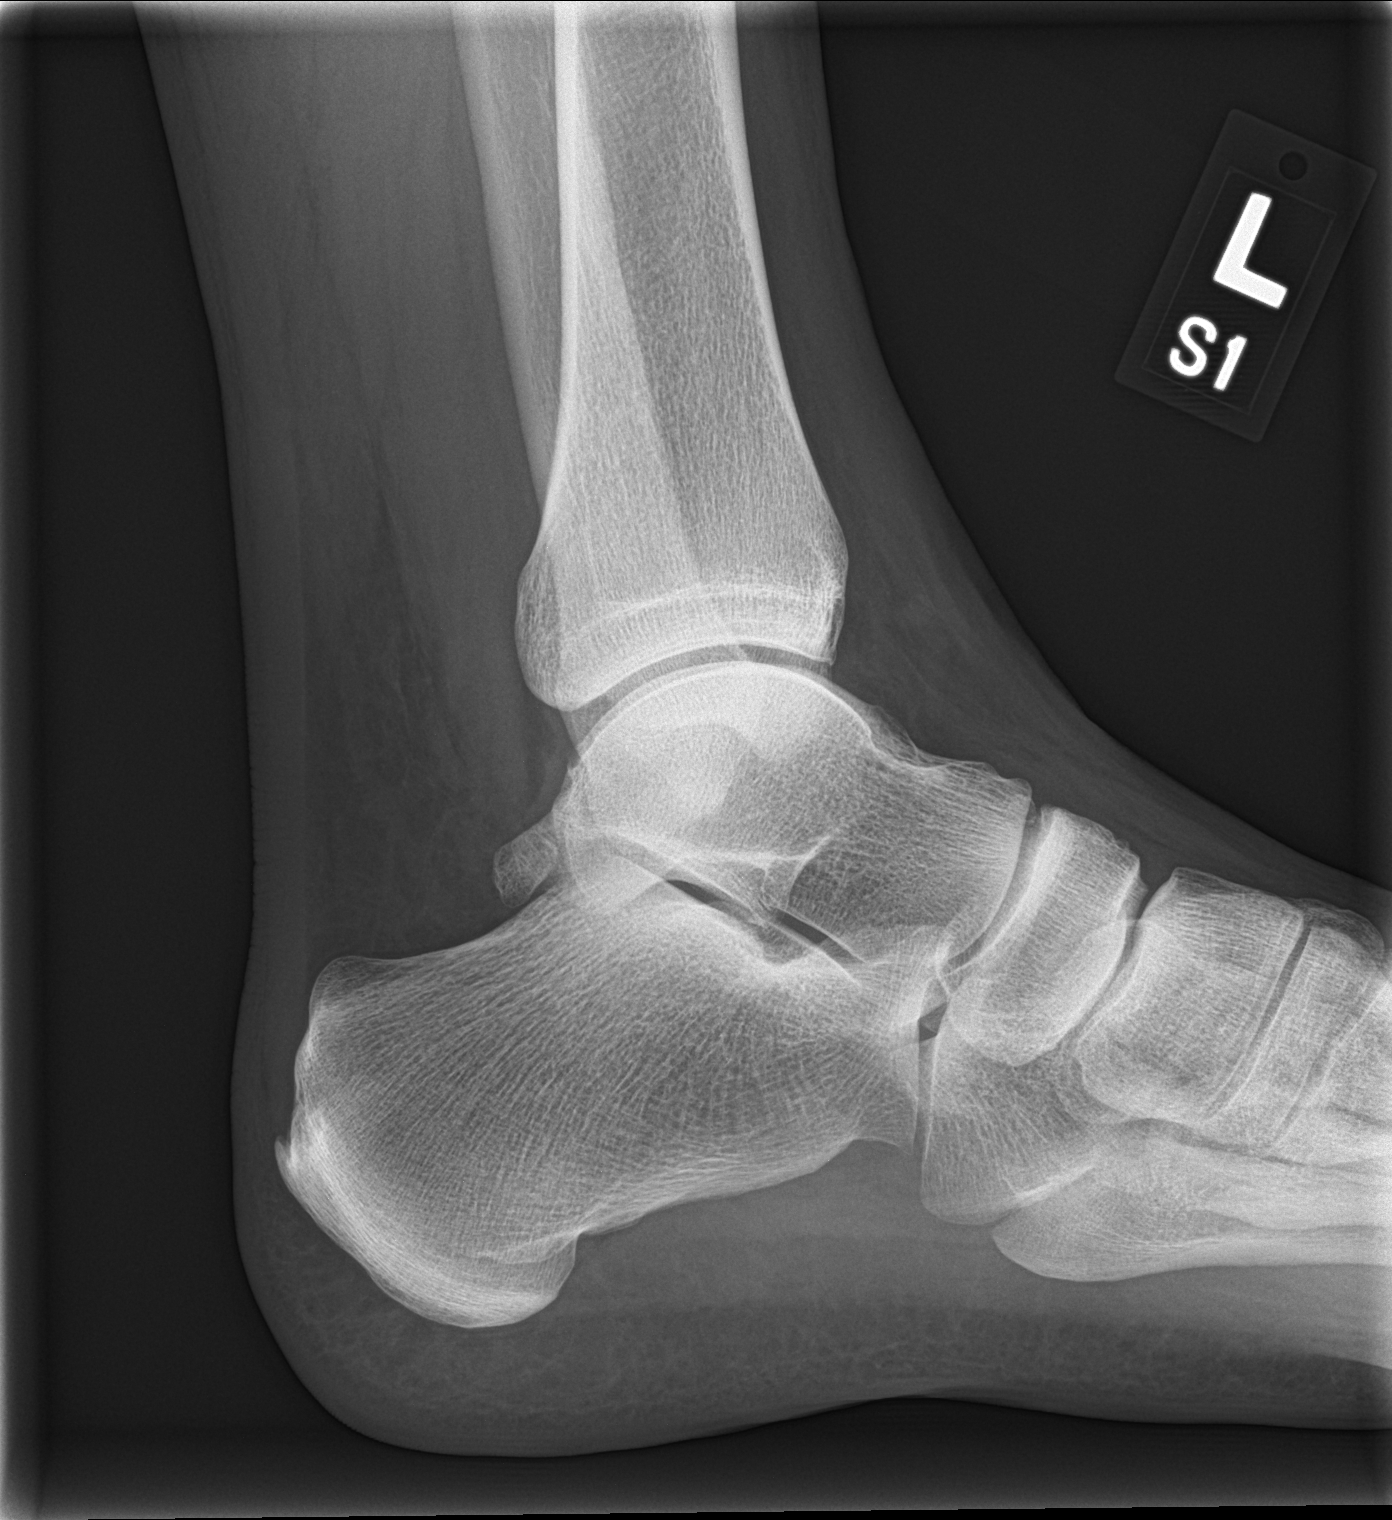

[3 of 3 positions shown; findings below may reference images not displayed]

FINDINGS: There is no evidence of fracture, dislocation, or joint effusion.
There is no evidence of arthropathy or other focal bone abnormality.
Soft tissues are unremarkable.
IMPRESSION: Negative exam.

## 2017-08-05 DIAGNOSIS — M25512 Pain in left shoulder: Secondary | ICD-10-CM | POA: Diagnosis not present

## 2017-08-06 DIAGNOSIS — M25511 Pain in right shoulder: Secondary | ICD-10-CM | POA: Diagnosis not present

## 2017-08-10 DIAGNOSIS — M25561 Pain in right knee: Secondary | ICD-10-CM | POA: Diagnosis not present

## 2017-08-13 DIAGNOSIS — M25561 Pain in right knee: Secondary | ICD-10-CM | POA: Diagnosis not present

## 2017-08-28 DIAGNOSIS — J019 Acute sinusitis, unspecified: Secondary | ICD-10-CM | POA: Diagnosis not present

## 2017-08-28 DIAGNOSIS — B9689 Other specified bacterial agents as the cause of diseases classified elsewhere: Secondary | ICD-10-CM | POA: Diagnosis not present

## 2017-09-14 DIAGNOSIS — M7540 Impingement syndrome of unspecified shoulder: Secondary | ICD-10-CM | POA: Insufficient documentation

## 2017-09-14 DIAGNOSIS — M25811 Other specified joint disorders, right shoulder: Secondary | ICD-10-CM | POA: Diagnosis not present

## 2017-09-14 DIAGNOSIS — M25812 Other specified joint disorders, left shoulder: Secondary | ICD-10-CM | POA: Diagnosis not present

## 2017-09-14 DIAGNOSIS — M7541 Impingement syndrome of right shoulder: Secondary | ICD-10-CM | POA: Diagnosis not present

## 2017-09-14 DIAGNOSIS — M7542 Impingement syndrome of left shoulder: Secondary | ICD-10-CM | POA: Diagnosis not present

## 2017-09-21 DIAGNOSIS — L02211 Cutaneous abscess of abdominal wall: Secondary | ICD-10-CM | POA: Diagnosis not present

## 2017-10-01 DIAGNOSIS — M25562 Pain in left knee: Secondary | ICD-10-CM | POA: Diagnosis not present

## 2017-10-27 DIAGNOSIS — M24111 Other articular cartilage disorders, right shoulder: Secondary | ICD-10-CM | POA: Diagnosis not present

## 2017-10-27 DIAGNOSIS — M94211 Chondromalacia, right shoulder: Secondary | ICD-10-CM | POA: Diagnosis not present

## 2017-10-27 DIAGNOSIS — M7541 Impingement syndrome of right shoulder: Secondary | ICD-10-CM | POA: Diagnosis not present

## 2017-10-27 DIAGNOSIS — G8918 Other acute postprocedural pain: Secondary | ICD-10-CM | POA: Diagnosis not present

## 2017-10-27 DIAGNOSIS — M19011 Primary osteoarthritis, right shoulder: Secondary | ICD-10-CM | POA: Diagnosis not present

## 2017-11-02 DIAGNOSIS — Z9889 Other specified postprocedural states: Secondary | ICD-10-CM | POA: Diagnosis not present

## 2017-11-02 DIAGNOSIS — M7540 Impingement syndrome of unspecified shoulder: Secondary | ICD-10-CM | POA: Diagnosis not present

## 2017-11-02 DIAGNOSIS — M25611 Stiffness of right shoulder, not elsewhere classified: Secondary | ICD-10-CM | POA: Diagnosis not present

## 2017-11-16 DIAGNOSIS — M25611 Stiffness of right shoulder, not elsewhere classified: Secondary | ICD-10-CM | POA: Diagnosis not present

## 2017-11-16 DIAGNOSIS — Z9889 Other specified postprocedural states: Secondary | ICD-10-CM | POA: Diagnosis not present

## 2017-11-16 DIAGNOSIS — M7540 Impingement syndrome of unspecified shoulder: Secondary | ICD-10-CM | POA: Diagnosis not present

## 2017-11-23 DIAGNOSIS — M7540 Impingement syndrome of unspecified shoulder: Secondary | ICD-10-CM | POA: Diagnosis not present

## 2017-11-23 DIAGNOSIS — M25611 Stiffness of right shoulder, not elsewhere classified: Secondary | ICD-10-CM | POA: Diagnosis not present

## 2017-11-23 DIAGNOSIS — Z9889 Other specified postprocedural states: Secondary | ICD-10-CM | POA: Diagnosis not present

## 2017-12-01 ENCOUNTER — Encounter: Payer: Self-pay | Admitting: Physician Assistant

## 2017-12-02 DIAGNOSIS — M25611 Stiffness of right shoulder, not elsewhere classified: Secondary | ICD-10-CM | POA: Diagnosis not present

## 2017-12-02 DIAGNOSIS — Z9889 Other specified postprocedural states: Secondary | ICD-10-CM | POA: Diagnosis not present

## 2017-12-02 DIAGNOSIS — M7540 Impingement syndrome of unspecified shoulder: Secondary | ICD-10-CM | POA: Diagnosis not present

## 2017-12-03 DIAGNOSIS — J0140 Acute pansinusitis, unspecified: Secondary | ICD-10-CM | POA: Diagnosis not present

## 2017-12-07 DIAGNOSIS — M25611 Stiffness of right shoulder, not elsewhere classified: Secondary | ICD-10-CM | POA: Diagnosis not present

## 2017-12-07 DIAGNOSIS — M7540 Impingement syndrome of unspecified shoulder: Secondary | ICD-10-CM | POA: Diagnosis not present

## 2017-12-07 DIAGNOSIS — Z9889 Other specified postprocedural states: Secondary | ICD-10-CM | POA: Diagnosis not present

## 2017-12-14 DIAGNOSIS — M7540 Impingement syndrome of unspecified shoulder: Secondary | ICD-10-CM | POA: Diagnosis not present

## 2017-12-14 DIAGNOSIS — Z9889 Other specified postprocedural states: Secondary | ICD-10-CM | POA: Diagnosis not present

## 2017-12-14 DIAGNOSIS — M25611 Stiffness of right shoulder, not elsewhere classified: Secondary | ICD-10-CM | POA: Diagnosis not present

## 2017-12-28 DIAGNOSIS — M25611 Stiffness of right shoulder, not elsewhere classified: Secondary | ICD-10-CM | POA: Diagnosis not present

## 2017-12-28 DIAGNOSIS — M7540 Impingement syndrome of unspecified shoulder: Secondary | ICD-10-CM | POA: Diagnosis not present

## 2017-12-28 DIAGNOSIS — Z9889 Other specified postprocedural states: Secondary | ICD-10-CM | POA: Diagnosis not present

## 2018-01-05 DIAGNOSIS — M7542 Impingement syndrome of left shoulder: Secondary | ICD-10-CM | POA: Diagnosis not present

## 2018-01-05 DIAGNOSIS — G8918 Other acute postprocedural pain: Secondary | ICD-10-CM | POA: Diagnosis not present

## 2018-01-05 DIAGNOSIS — M24112 Other articular cartilage disorders, left shoulder: Secondary | ICD-10-CM | POA: Diagnosis not present

## 2018-01-11 DIAGNOSIS — R6889 Other general symptoms and signs: Secondary | ICD-10-CM | POA: Diagnosis not present

## 2018-01-11 DIAGNOSIS — R29898 Other symptoms and signs involving the musculoskeletal system: Secondary | ICD-10-CM | POA: Diagnosis not present

## 2018-01-11 DIAGNOSIS — M25612 Stiffness of left shoulder, not elsewhere classified: Secondary | ICD-10-CM | POA: Diagnosis not present

## 2018-01-11 DIAGNOSIS — Z9889 Other specified postprocedural states: Secondary | ICD-10-CM | POA: Diagnosis not present

## 2018-01-25 DIAGNOSIS — M25612 Stiffness of left shoulder, not elsewhere classified: Secondary | ICD-10-CM | POA: Diagnosis not present

## 2018-01-25 DIAGNOSIS — R6889 Other general symptoms and signs: Secondary | ICD-10-CM | POA: Diagnosis not present

## 2018-01-25 DIAGNOSIS — R29898 Other symptoms and signs involving the musculoskeletal system: Secondary | ICD-10-CM | POA: Diagnosis not present

## 2018-01-25 DIAGNOSIS — Z9889 Other specified postprocedural states: Secondary | ICD-10-CM | POA: Diagnosis not present

## 2018-02-01 DIAGNOSIS — Z9889 Other specified postprocedural states: Secondary | ICD-10-CM | POA: Diagnosis not present

## 2018-02-01 DIAGNOSIS — R29898 Other symptoms and signs involving the musculoskeletal system: Secondary | ICD-10-CM | POA: Diagnosis not present

## 2018-02-01 DIAGNOSIS — R6889 Other general symptoms and signs: Secondary | ICD-10-CM | POA: Diagnosis not present

## 2018-02-01 DIAGNOSIS — M25612 Stiffness of left shoulder, not elsewhere classified: Secondary | ICD-10-CM | POA: Diagnosis not present

## 2018-02-08 DIAGNOSIS — M25612 Stiffness of left shoulder, not elsewhere classified: Secondary | ICD-10-CM | POA: Diagnosis not present

## 2018-02-08 DIAGNOSIS — R6889 Other general symptoms and signs: Secondary | ICD-10-CM | POA: Diagnosis not present

## 2018-02-08 DIAGNOSIS — Z9889 Other specified postprocedural states: Secondary | ICD-10-CM | POA: Diagnosis not present

## 2018-02-08 DIAGNOSIS — R29898 Other symptoms and signs involving the musculoskeletal system: Secondary | ICD-10-CM | POA: Diagnosis not present

## 2018-02-15 DIAGNOSIS — R6889 Other general symptoms and signs: Secondary | ICD-10-CM | POA: Diagnosis not present

## 2018-02-15 DIAGNOSIS — M25612 Stiffness of left shoulder, not elsewhere classified: Secondary | ICD-10-CM | POA: Diagnosis not present

## 2018-02-15 DIAGNOSIS — Z9889 Other specified postprocedural states: Secondary | ICD-10-CM | POA: Diagnosis not present

## 2018-02-15 DIAGNOSIS — R29898 Other symptoms and signs involving the musculoskeletal system: Secondary | ICD-10-CM | POA: Diagnosis not present

## 2018-02-28 ENCOUNTER — Other Ambulatory Visit: Payer: Self-pay

## 2018-02-28 ENCOUNTER — Ambulatory Visit (INDEPENDENT_AMBULATORY_CARE_PROVIDER_SITE_OTHER): Payer: BLUE CROSS/BLUE SHIELD | Admitting: Family Medicine

## 2018-02-28 ENCOUNTER — Encounter: Payer: Self-pay | Admitting: Family Medicine

## 2018-02-28 VITALS — BP 131/77 | HR 85 | Temp 98.0°F | Ht 74.0 in | Wt 264.8 lb

## 2018-02-28 DIAGNOSIS — E669 Obesity, unspecified: Secondary | ICD-10-CM

## 2018-02-28 DIAGNOSIS — Z13220 Encounter for screening for lipoid disorders: Secondary | ICD-10-CM

## 2018-02-28 DIAGNOSIS — R369 Urethral discharge, unspecified: Secondary | ICD-10-CM

## 2018-02-28 DIAGNOSIS — Z130 Encounter for screening for diseases of the blood and blood-forming organs and certain disorders involving the immune mechanism: Secondary | ICD-10-CM | POA: Diagnosis not present

## 2018-02-28 DIAGNOSIS — Z0000 Encounter for general adult medical examination without abnormal findings: Secondary | ICD-10-CM | POA: Diagnosis not present

## 2018-02-28 DIAGNOSIS — Z131 Encounter for screening for diabetes mellitus: Secondary | ICD-10-CM | POA: Diagnosis not present

## 2018-02-28 DIAGNOSIS — Z6834 Body mass index (BMI) 34.0-34.9, adult: Secondary | ICD-10-CM

## 2018-02-28 LAB — POCT URINALYSIS DIP (MANUAL ENTRY)
BILIRUBIN UA: NEGATIVE
BILIRUBIN UA: NEGATIVE mg/dL
Glucose, UA: NEGATIVE mg/dL
LEUKOCYTES UA: NEGATIVE
NITRITE UA: NEGATIVE
PH UA: 5.5 (ref 5.0–8.0)
PROTEIN UA: NEGATIVE mg/dL
RBC UA: NEGATIVE
Spec Grav, UA: 1.02 (ref 1.010–1.025)
Urobilinogen, UA: 0.2 E.U./dL

## 2018-02-28 NOTE — Progress Notes (Signed)
Subjective:  By signing my name below, I, Paul Humphrey, attest that this documentation has been prepared under the direction and in the presence of Paul Agreste, MD Electronically Signed: Ladene Humphrey, ED Scribe 02/28/2018 at 10:20 AM.   Patient ID: Paul Humphrey, male    DOB: Sep 12, 1985, 32 y.o.   MRN: [id number]  Chief Complaint  Patient presents with  . Annual Exam    CPE- Question regards to colon and wants to check area in left scrotum area   HPI Paul Humphrey is a 32 y.o. male who presents to Primary Care at [facility] for an annual exam. Has been under the care of ortho for R shoulder arthroscopy and impingement syndrome and L  Shoulder subacromial decompression surgery in May. Ortho at [facility]. H/o chronic sinusitis with nasal septoplasty with turbinate reduction 12/2016 by Dr. Constance Holster. H/o GERD. Occasional abdominal pain, cysts scrotum, discuss colonoscopy alternatives, occasional penile discharge after defecating. He did have a colostomy in 2013 with reversal in 2014.  GERD H/o endoscopy 2016. Normal duodenum, irregular z-line, normal esophagus. pH probe was placed.  Occasional Penile Discharge Pt reports occasional penile discharge after defecation, rare. He has noticed another suspected cyst to the L scrotum that began this Spring. Denies dysuria, urinary frequency, testicular pain/soreness. Denies new sexual partners since last STI screening.  CA Screening Colonoscopy: Prostate CA Screening: Lab Results  Component Value Date   PSA 0.82 10/10/2015   Family Hx Family h/o of breast, lung, brain, leukemia and colon in grandparents. Uncle with colon at 22. Mother with breast.  Immunizations Immunization History  Administered Date(s) Administered  . Tdap 03/18/2015   Depression Screening Depression screen [facility] 2/9 02/28/2018 02/03/2017 11/23/2016 08/20/2016 05/16/2016  Decreased Interest 0 0 0 0 0  Down, Depressed, Hopeless 0 0 0 - 0  PHQ - 2 Score 0 0 0 0  0     Visual Acuity Screening   Right eye Left eye Both eyes  Without correction:     With correction: 20/15-1 20/15-1 20/15-1   Vision: Dentist: Exercise: limited due to shoulder but has been walking  Patient Active Problem List   Diagnosis Date Noted  . Pain in both hands 11/23/2016  . Great toe pain, left 11/23/2016  . GERD (gastroesophageal reflux disease) 03/18/2015  . Abdominal pain 01/07/2015  . Tingling 01/07/2015  . Suspected exposure to heavy metals 01/07/2015  . Vitamin D deficiency 01/07/2015   Past Medical History:  Diagnosis Date  . Arthritis of big toe    left  . Bulging of cervical intervertebral disc   . Chronic ethmoidal sinusitis 12/2016  . Chronic maxillary sinusitis 12/2016  . Dental crowns present   . Deviated nasal septum 12/2016  . Family history of adverse reaction to anesthesia    pt's mother has hx. of being hard to wake up post-op  . Lip laceration 12/28/2016   no stitches required, per pt.  . 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 Sinus headache    Past Surgical History:  Procedure Laterality Date  . CHOLECYSTECTOMY  2016  . COLECTOMY  2013  . COLOSTOMY  2013  . COLOSTOMY REVERSAL  2014  . LEG SURGERY Left 08/12/2005   lateral compartment release  . NASAL SEPTOPLASTY W/ TURBINOPLASTY Bilateral 01/04/2017   Procedure: NASAL SEPTOPLASTY WITH TURBINATE REDUCTION;  Surgeon: Izora Gala, MD;  Location: [place];  Service: ENT;  Laterality: Bilateral;  . NASAL SINUS SURGERY Bilateral 01/04/2017  Procedure: ENDOSCOPIC SINUS SURGERY;  Surgeon: Izora Gala, MD;  Location: [place];  Service: ENT;  Laterality: Bilateral;  . UMBILICAL HERNIA REPAIR  2016   x 2  . URETERAL STENT PLACEMENT  2014   Allergies  Allergen Reactions  . Gadolinium Derivatives Nausea And Vomiting  . Ivp Dye [Iodinated Diagnostic Agents] Nausea Only  . Lactose Intolerance (Gi) Other (See Comments)    GI UPSET  .  Percocet [Oxycodone-Acetaminophen] Other (See Comments)    "SKIN CRAWLING" SENSATION   Prior to Admission medications   Medication Sig Start Date End Date Taking? Authorizing Provider  aspirin EC 81 MG tablet Take 81 mg by mouth daily.   Yes [provider]  cholecalciferol (VITAMIN D) 1000 UNITS tablet Take 1,000 Units by mouth daily.   Yes [provider]  ibuprofen (ADVIL,MOTRIN) 200 MG tablet Take 200 mg by mouth every 6 (six) hours as needed.   Yes [provider]  loratadine (CLARITIN) 10 MG tablet Take 10 mg by mouth daily.   Yes [provider]  Multiple Vitamins-Minerals (MULTIVITAMIN ADULT PO) Take 1 tablet by mouth daily. Reported on 01/18/2016   Yes [provider]   Social History   Socioeconomic History  . Marital status: Single    Spouse name: Not on file  . Number of children: Not on file  . Years of education: Not on file  . Highest education level: Not on file  Occupational History  . Not on file  Social Needs  . Financial resource strain: Not on file  . Food insecurity:    Worry: Not on file    Inability: Not on file  . Transportation needs:    Medical: Not on file    Non-medical: Not on file  Tobacco Use  . Smoking status: Never Smoker  . Smokeless tobacco: Never Used  Substance and Sexual Activity  . Alcohol use: Yes    Alcohol/week: 0.0 oz    Comment: socially  . Drug use: No  . Sexual activity: Not on file  Lifestyle  . Physical activity:    Days per week: Not on file    Minutes per session: Not on file  . Stress: Not on file  Relationships  . Social connections:    Talks on phone: Not on file    Gets together: Not on file    Attends religious service: Not on file    Active member of club or organization: Not on file    Attends meetings of clubs or organizations: Not on file    Relationship status: Not on file  . Intimate partner violence:    Fear of current or ex partner: Not on file     Emotionally abused: Not on file    Physically abused: Not on file    Forced sexual activity: Not on file  Other Topics Concern  . Not on file  Social History Narrative   Lives with parents in a 2 story home.     Has no children.    Works as a Pharmacist, hospital, looking for a job here.    Highest level of education:  MFA   Review of Systems  Gastrointestinal: Positive for abdominal pain.  Genitourinary: Positive for discharge (occasional). Negative for dysuria, frequency and testicular pain.      Objective:   Physical Exam  Constitutional: He is oriented to person, place, and time. He appears well-developed and well-nourished.  HENT:  Head: Normocephalic and atraumatic.  Right Ear: External  ear normal.  Left Ear: External ear normal.  Mouth/Throat: Oropharynx is clear and moist.  Eyes: Pupils are equal, round, and reactive to light. Conjunctivae and EOM are normal.  Neck: Normal range of motion. Neck supple. No thyromegaly present.  Cardiovascular: Normal rate, regular rhythm, normal heart sounds and intact distal pulses.  Pulmonary/Chest: Effort normal and breath sounds normal. No respiratory distress. He has no wheezes.  Abdominal: Soft. He exhibits no distension. There is no tenderness. Hernia confirmed negative in the right inguinal area and confirmed negative in the left inguinal area.  Well-healed scar on abdomen. No focal tenderness.  Genitourinary: No penile erythema. No discharge found.  Genitourinary Comments: Area of concern on L scrotum appears to be hair follicle. No hernia, erythema, apparent cysts, penile discharge or rash.  Musculoskeletal: Normal range of motion. He exhibits no edema or tenderness.  Lymphadenopathy:    He has no cervical adenopathy.  Neurological: He is alert and oriented to person, place, and time. He has normal reflexes.  Skin: Skin is warm and dry.  Psychiatric: He has a normal mood and affect. His behavior is normal.  Vitals reviewed.  Vitals:    02/28/18 0935  BP: 131/77  Pulse: 85  Temp: 98 F (36.7 C)  TempSrc: Oral  SpO2: 99%  Weight: 264 lb 12.8 oz (120.1 kg)  Height: 6\' 2"  (1.88 m)      Assessment & Plan:  Paul Humphrey is a 31 y.o. male Annual physical exam - Plan: Lipid panel, Comprehensive metabolic panel, HIV antibody  - -anticipatory guidance as below in AVS, screening labs above. Health maintenance items as above in HPI discussed/recommended as applicable.   Screening, anemia, deficiency, iron - Plan: CBC  -Reported family history of leukemia, check CBC.  Penile discharge - Plan: POCT urinalysis dipstick, PSA  -Episode in March, likely prostatic discharge after bowel movement.  Has not had recurrence or other symptoms of STI, deferred further testing at this time other than PSA.  Urinalysis was reassuring.  RTC precautions if symptoms recur.   Class 1 obesity without serious comorbidity with body mass index (BMI) of 34.0 to 34.9 in adult, unspecified obesity type Screening for diabetes mellitus Screening for hyperlipidemia  -BMI of 34, discussed weight loss with diet changes and increased activity/exercise, recheck in 6 months.  Will screen for secondary issues of diabetes and hyperlipidemia as above.   No orders of the defined types were placed in this encounter.  Patient Instructions    Area on scrotum appears to be normal oil gland or hair follicle.  There are no signs of infection or concerns at this time.  If area increases in size, is more sore or red, return for recheck.  I will check urine test as well as prostate test, but since the discharge has not been present since March, I do not think that further work-up is needed at this time.  If the symptoms return, please follow-up to discuss other testing.  Continue to avoid fast food, and try to make your own food for lunches at home if possible.  Avoid sugar containing beverages such as sodas and sweet teas and some form of exercise most days per week with  minimum of 150 minutes/week to help with weight loss.  Follow-up in 6 months so we can check those numbers again, but I am happy to see you sooner if needed.   Keeping you healthy  Get these tests  Blood pressure- Have your blood pressure checked once a year  by your healthcare provider.  Normal blood pressure is 120/80.  Weight- Have your body mass index (BMI) calculated to screen for obesity.  BMI is a measure of body fat based on height and weight. You can also calculate your own BMI at [employer]ags.it.  Cholesterol- Have your cholesterol checked regularly starting at age 75, sooner may be necessary if you have diabetes, high blood pressure, if a family member developed heart diseases at an early age or if you smoke.   Chlamydia, HIV, and other sexual transmitted disease- Get screened each year until the age of 37 then within three months of each new sexual partner.  Diabetes- Have your blood sugar checked regularly if you have high blood pressure, high cholesterol, a family history of diabetes or if you are overweight.  Get these vaccines  Flu shot- Every fall.  Tetanus shot- Every 10 years.  Menactra- Single dose; prevents meningitis.  Take these steps  Don't smoke- If you do smoke, ask your healthcare provider about quitting. For tips on how to quit, go to www.smokefree.gov or call 1-800-QUIT-NOW.  Be physically active- Exercise 5 days a week for at least 30 minutes.  If you are not already physically active start slow and gradually work up to 30 minutes of moderate physical activity.  Examples of moderate activity include walking briskly, mowing the yard, dancing, swimming bicycling, etc.  Eat a healthy diet- Eat a variety of healthy foods such as fruits, vegetables, low fat milk, low fat cheese, yogurt, lean meats, poultry, fish, beans, tofu, etc.  For more information on healthy eating, go to www.thenutritionsource.org  Drink alcohol in moderation- Limit alcohol  intake two drinks or less a day.  Never drink and drive.  Dentist- Brush and floss teeth twice daily; visit your dentis twice a year.  Depression-Your emotional health is as important as your physical health.  If you're feeling down, losing interest in things you normally enjoy please talk with your healthcare provider.  Gun Safety- If you keep a gun in your home, keep it unloaded and with the safety lock on.  Bullets should be stored separately.  Helmet use- Always wear a helmet when riding a motorcycle, bicycle, rollerblading or skateboarding.  Safe sex- If you may be exposed to a sexually transmitted infection, use a condom  Seat belts- Seat bels can save your life; always wear one.  Smoke/Carbon Monoxide detectors- These detectors need to be installed on the appropriate level of your home.  Replace batteries at least once a year.  Skin Cancer- When out in the sun, cover up and use sunscreen SPF 15 or higher.  Violence- If anyone is threatening or hurting you, please tell your healthcare provider.  IF you received an x-ray today, you will receive an invoice from [facility]. Please contact [facility] at [phone] with questions or concerns regarding your invoice.   IF you received labwork today, you will receive an invoice from [facility]. Please contact [facility] at [phone] with questions or concerns regarding your invoice.   Our billing staff will not be able to assist you with questions regarding bills from these companies.  You will be contacted with the lab results as soon as they are available. The fastest way to get your results is to activate your My Chart account. Instructions are located on the last page of this paperwork. If you have not heard from [place] regarding the results in 2 weeks, please contact this office.       I personally performed  the services described in this documentation, which was scribed in my presence. The recorded information has  been reviewed and considered for accuracy and completeness, addended by me as needed, and agree with information above.  Signed,   [name], MD Primary [facility] at [place].  02/28/18 10:53 AM

## 2018-02-28 NOTE — Patient Instructions (Addendum)
Area on scrotum appears to be normal oil gland or hair follicle.  There are no signs of infection or concerns at this time.  If area increases in size, is more sore or red, return for recheck.  I will check urine test as well as prostate test, but since the discharge has not been present since March, I do not think that further work-up is needed at this time.  If the symptoms return, please follow-up to discuss other testing.  Continue to avoid fast food, and try to make your own food for lunches at home if possible.  Avoid sugar containing beverages such as sodas and sweet teas and some form of exercise most days per week with minimum of 150 minutes/week to help with weight loss.  Follow-up in 6 months so we can check those numbers again, but I am happy to see you sooner if needed.   Keeping you healthy  Get these tests  Blood pressure- Have your blood pressure checked once a year by your healthcare provider.  Normal blood pressure is 120/80.  Weight- Have your body mass index (BMI) calculated to screen for obesity.  BMI is a measure of body fat based on height and weight. You can also calculate your own BMI at [employer].  Cholesterol- Have your cholesterol checked regularly starting at age 55, sooner may be necessary if you have diabetes, high blood pressure, if a family member developed heart diseases at an early age or if you smoke.   Chlamydia, HIV, and other sexual transmitted disease- Get screened each year until the age of 48 then within three months of each new sexual partner.  Diabetes- Have your blood sugar checked regularly if you have high blood pressure, high cholesterol, a family history of diabetes or if you are overweight.  Get these vaccines  Flu shot- Every fall.  Tetanus shot- Every 10 years.  Menactra- Single dose; prevents meningitis.  Take these steps  Don't smoke- If you do smoke, ask your healthcare provider about quitting. For tips on how to  quit, go to www.smokefree.gov or call 1-800-QUIT-NOW.  Be physically active- Exercise 5 days a week for at least 30 minutes.  If you are not already physically active start slow and gradually work up to 30 minutes of moderate physical activity.  Examples of moderate activity include walking briskly, mowing the yard, dancing, swimming bicycling, etc.  Eat a healthy diet- Eat a variety of healthy foods such as fruits, vegetables, low fat milk, low fat cheese, yogurt, lean meats, poultry, fish, beans, tofu, etc.  For more information on healthy eating, go to www.thenutritionsource.org  Drink alcohol in moderation- Limit alcohol intake two drinks or less a day.  Never drink and drive.  Dentist- Brush and floss teeth twice daily; visit your dentis twice a year.  Depression-Your emotional health is as important as your physical health.  If you're feeling down, losing interest in things you normally enjoy please talk with your healthcare provider.  Gun Safety- If you keep a gun in your home, keep it unloaded and with the safety lock on.  Bullets should be stored separately.  Helmet use- Always wear a helmet when riding a motorcycle, bicycle, rollerblading or skateboarding.  Safe sex- If you may be exposed to a sexually transmitted infection, use a condom  Seat belts- Seat bels can save your life; always wear one.  Smoke/Carbon Monoxide detectors- These detectors need to be installed on the appropriate level of your home.  Replace batteries at  least once a year.  Skin Cancer- When out in the sun, cover up and use sunscreen SPF 15 or higher.  Violence- If anyone is threatening or hurting you, please tell your healthcare provider.  IF you received an x-ray today, you will receive an invoice from [facility]. Please contact [facility] at [phone] with questions or concerns regarding your invoice.   IF you received labwork today, you will receive an invoice from [facility]. Please  contact [facility] at [phone] with questions or concerns regarding your invoice.   Our billing staff will not be able to assist you with questions regarding bills from these companies.  You will be contacted with the lab results as soon as they are available. The fastest way to get your results is to activate your My Chart account. Instructions are located on the last page of this paperwork. If you have not heard from [place] regarding the results in 2 weeks, please contact this office.

## 2018-03-01 LAB — COMPREHENSIVE METABOLIC PANEL
ALBUMIN: 4.4 g/dL (ref 3.5–5.5)
ALK PHOS: 39 IU/L (ref 39–117)
ALT: 36 IU/L (ref 0–44)
AST: 23 IU/L (ref 0–40)
Albumin/Globulin Ratio: 1.8 (ref 1.2–2.2)
BUN / CREAT RATIO: 14 (ref 9–20)
BUN: 15 mg/dL (ref 6–20)
Bilirubin Total: 0.6 mg/dL (ref 0.0–1.2)
CO2: 20 mmol/L (ref 20–29)
CREATININE: 1.09 mg/dL (ref 0.76–1.27)
Calcium: 9.5 mg/dL (ref 8.7–10.2)
Chloride: 106 mmol/L (ref 96–106)
GFR calc Af Amer: 105 mL/min/{1.73_m2} (ref >59)
GFR calc non Af Amer: 91 mL/min/{1.73_m2} (ref >59)
GLUCOSE: 85 mg/dL (ref 65–99)
Globulin, Total: 2.4 g/dL (ref 1.5–4.5)
Potassium: 4.2 mmol/L (ref 3.5–5.2)
Sodium: 141 mmol/L (ref 134–144)
TOTAL PROTEIN: 6.8 g/dL (ref 6.0–8.5)

## 2018-03-01 LAB — LIPID PANEL
CHOL/HDL RATIO: 5.2 ratio — AB (ref 0.0–5.0)
Cholesterol, Total: 141 mg/dL (ref 100–199)
HDL: 27 mg/dL — AB (ref >39)
LDL Calculated: 69 mg/dL (ref 0–99)
TRIGLYCERIDES: 226 mg/dL — AB (ref 0–149)
VLDL CHOLESTEROL CAL: 45 mg/dL — AB (ref 5–40)

## 2018-03-01 LAB — CBC
Hematocrit: 45.5 % (ref 37.5–51.0)
Hemoglobin: 16.2 g/dL (ref 13.0–17.7)
MCH: 30.5 pg (ref 26.6–33.0)
MCHC: 35.6 g/dL (ref 31.5–35.7)
MCV: 86 fL (ref 79–97)
PLATELETS: 170 10*3/uL (ref 150–450)
RBC: 5.31 x10E6/uL (ref 4.14–5.80)
RDW: 14.1 % (ref 12.3–15.4)
WBC: 6.1 10*3/uL (ref 3.4–10.8)

## 2018-03-01 LAB — PSA: PROSTATE SPECIFIC AG, SERUM: 1 ng/mL (ref 0.0–4.0)

## 2018-03-01 LAB — HIV ANTIBODY (ROUTINE TESTING W REFLEX): HIV SCREEN 4TH GENERATION: NONREACTIVE

## 2018-03-02 DIAGNOSIS — Z9889 Other specified postprocedural states: Secondary | ICD-10-CM | POA: Diagnosis not present

## 2018-03-02 DIAGNOSIS — M25612 Stiffness of left shoulder, not elsewhere classified: Secondary | ICD-10-CM | POA: Diagnosis not present

## 2018-03-02 DIAGNOSIS — R29898 Other symptoms and signs involving the musculoskeletal system: Secondary | ICD-10-CM | POA: Diagnosis not present

## 2018-03-02 DIAGNOSIS — R6889 Other general symptoms and signs: Secondary | ICD-10-CM | POA: Diagnosis not present

## 2018-04-26 DIAGNOSIS — M545 Low back pain: Secondary | ICD-10-CM | POA: Diagnosis not present

## 2018-05-27 ENCOUNTER — Emergency Department (HOSPITAL_COMMUNITY): Payer: BLUE CROSS/BLUE SHIELD

## 2018-05-27 ENCOUNTER — Ambulatory Visit (INDEPENDENT_AMBULATORY_CARE_PROVIDER_SITE_OTHER): Payer: BLUE CROSS/BLUE SHIELD

## 2018-05-27 ENCOUNTER — Emergency Department (HOSPITAL_COMMUNITY)
Admission: EM | Admit: 2018-05-27 | Discharge: 2018-05-28 | Disposition: A | Discharge status: Home / Self Care | Payer: BLUE CROSS/BLUE SHIELD | Attending: Emergency Medicine | Admitting: Emergency Medicine

## 2018-05-27 ENCOUNTER — Encounter: Payer: Self-pay | Admitting: Family Medicine

## 2018-05-27 ENCOUNTER — Encounter (HOSPITAL_COMMUNITY): Payer: Self-pay | Admitting: Emergency Medicine

## 2018-05-27 ENCOUNTER — Other Ambulatory Visit: Payer: Self-pay

## 2018-05-27 ENCOUNTER — Ambulatory Visit: Payer: BLUE CROSS/BLUE SHIELD | Admitting: Family Medicine

## 2018-05-27 ENCOUNTER — Ambulatory Visit: Payer: Self-pay | Admitting: Family Medicine

## 2018-05-27 VITALS — BP 117/78 | HR 117 | Temp 102.1°F | Resp 18 | Ht 74.0 in | Wt 259.2 lb

## 2018-05-27 DIAGNOSIS — R509 Fever, unspecified: Secondary | ICD-10-CM | POA: Insufficient documentation

## 2018-05-27 DIAGNOSIS — Z79899 Other long term (current) drug therapy: Secondary | ICD-10-CM | POA: Insufficient documentation

## 2018-05-27 DIAGNOSIS — R51 Headache: Secondary | ICD-10-CM | POA: Diagnosis not present

## 2018-05-27 LAB — URINALYSIS, ROUTINE W REFLEX MICROSCOPIC
Bacteria, UA: NONE SEEN
Bilirubin Urine: NEGATIVE
GLUCOSE, UA: NEGATIVE mg/dL
Ketones, ur: NEGATIVE mg/dL
Nitrite: NEGATIVE
PH: 6 (ref 5.0–8.0)
Protein, ur: NEGATIVE mg/dL
SPECIFIC GRAVITY, URINE: 1.018 (ref 1.005–1.030)

## 2018-05-27 LAB — POCT CBC
Granulocyte percent: 87.9 %G — AB (ref 37–80)
HCT, POC: 46.5 % (ref 43.5–53.7)
Hemoglobin: 15.9 g/dL (ref 14.1–18.1)
Lymph, poc: 0.9 (ref 0.6–3.4)
MCH, POC: 30 pg (ref 27–31.2)
MCHC: 34.2 g/dL (ref 31.8–35.4)
MCV: 87.8 fL (ref 80–97)
MID (cbc): 0.4 (ref 0–0.9)
MPV: 7.1 fL (ref 0–99.8)
POC Granulocyte: 9.6 — AB (ref 2–6.9)
POC LYMPH PERCENT: 8.2 %L — AB (ref 10–50)
POC MID %: 3.9 %M (ref 0–12)
Platelet Count, POC: 179 10*3/uL (ref 142–424)
RBC: 5.29 M/uL (ref 4.69–6.13)
RDW, POC: 13.3 %
WBC: 10.9 10*3/uL — AB (ref 4.6–10.2)

## 2018-05-27 LAB — CBC WITH DIFFERENTIAL/PLATELET
ABS IMMATURE GRANULOCYTES: 0 10*3/uL (ref 0.0–0.1)
BASOS PCT: 0 %
Basophils Absolute: 0 10*3/uL (ref 0.0–0.1)
Eosinophils Absolute: 0.1 10*3/uL (ref 0.0–0.7)
Eosinophils Relative: 0 %
HCT: 48.6 % (ref 39.0–52.0)
Hemoglobin: 16.1 g/dL (ref 13.0–17.0)
Immature Granulocytes: 0 %
Lymphocytes Relative: 6 %
Lymphs Abs: 0.7 10*3/uL (ref 0.7–4.0)
MCH: 29.7 pg (ref 26.0–34.0)
MCHC: 33.1 g/dL (ref 30.0–36.0)
MCV: 89.7 fL (ref 78.0–100.0)
MONO ABS: 1.2 10*3/uL — AB (ref 0.1–1.0)
MONOS PCT: 10 %
NEUTROS ABS: 9.9 10*3/uL — AB (ref 1.7–7.7)
NEUTROS PCT: 84 %
PLATELETS: 165 10*3/uL (ref 150–400)
RBC: 5.42 MIL/uL (ref 4.22–5.81)
RDW: 13.2 % (ref 11.5–15.5)
WBC: 11.8 10*3/uL — ABNORMAL HIGH (ref 4.0–10.5)

## 2018-05-27 LAB — COMPREHENSIVE METABOLIC PANEL
ALK PHOS: 35 U/L — AB (ref 38–126)
ALT: 45 U/L — ABNORMAL HIGH (ref 0–44)
ANION GAP: 12 (ref 5–15)
AST: 31 U/L (ref 15–41)
Albumin: 4.2 g/dL (ref 3.5–5.0)
BILIRUBIN TOTAL: 1.4 mg/dL — AB (ref 0.3–1.2)
BUN: 12 mg/dL (ref 6–20)
CALCIUM: 9 mg/dL (ref 8.9–10.3)
CO2: 20 mmol/L — AB (ref 22–32)
Chloride: 105 mmol/L (ref 98–111)
Creatinine, Ser: 1.19 mg/dL (ref 0.61–1.24)
GFR calc non Af Amer: >60 mL/min (ref >60)
GLUCOSE: 113 mg/dL — AB (ref 70–99)
Potassium: 3.7 mmol/L (ref 3.5–5.1)
SODIUM: 137 mmol/L (ref 135–145)
Total Protein: 7.4 g/dL (ref 6.5–8.1)

## 2018-05-27 LAB — POC MICROSCOPIC URINALYSIS (UMFC): Mucus: ABSENT

## 2018-05-27 LAB — I-STAT CG4 LACTIC ACID, ED: Lactic Acid, Venous: 1.94 mmol/L — ABNORMAL HIGH (ref 0.5–1.9)

## 2018-05-27 LAB — POC INFLUENZA A&B (BINAX/QUICKVUE)
Influenza A, POC: NEGATIVE
Influenza B, POC: NEGATIVE

## 2018-05-27 LAB — POCT URINALYSIS DIP (MANUAL ENTRY)
Bilirubin, UA: NEGATIVE
Blood, UA: NEGATIVE
Glucose, UA: NEGATIVE mg/dL
Ketones, POC UA: NEGATIVE mg/dL
Nitrite, UA: NEGATIVE
Protein Ur, POC: NEGATIVE mg/dL
Spec Grav, UA: 1.02 (ref 1.010–1.025)
Urobilinogen, UA: 0.2 E.U./dL
pH, UA: 7 (ref 5.0–8.0)

## 2018-05-27 MED ORDER — AMOXICILLIN-POT CLAVULANATE 875-125 MG PO TABS: 1.0000 | ORAL_TABLET | Freq: Two times a day (BID) | ORAL | 0 refills | Status: DC

## 2018-05-27 MED ORDER — ACETAMINOPHEN 325 MG PO TABS
650.0000 mg | ORAL_TABLET | Freq: Once | ORAL | Status: AC | PRN
  Administered 2018-05-27: 650 mg via ORAL
  Filled 2018-05-27: qty 2

## 2018-05-27 MED ORDER — ACETAMINOPHEN 500 MG PO TABS
1000.0000 mg | ORAL_TABLET | Freq: Once | ORAL | Status: AC
  Administered 2018-05-27: 1000 mg via ORAL

## 2018-05-27 NOTE — Progress Notes (Signed)
10/4/20192:01 PM  Paul Humphrey [birth year], 32 y.o. male [id number]  Chief Complaint  Patient presents with  . Fever    temp is 102.1  . Dysuria  . Chest Pain    having a chest tightness but went away when getting up   . Chills  . Rash    chest and forearms     HPI:   Patient is a 32 y.o. male who presents today for fever  About 4 days ago started having rash on his chest but did not  Think much of it as he works in a [employer] Next day noticed same red bumps on his arms 2 nights ago started having night fevers and chills Last night started having some mild dysuria Then this morning around 4am noticed chest pressure around left sternum When he woke up at 630am it had resolved Today started to have foggy head, body aches, headaches No cough, SOB, abd pain, nausea, vomiting, diarrhea, no pain with BM Had teeth cleaned on Monday 2 weeks ago on oral steroids for back pain No known sick contacts  H/o sinus and bowel surgery H/o UTI H/o sepsis No recent international travel  Fall Risk  05/27/2018 02/28/2018 02/03/2017 11/23/2016 08/20/2016  Falls in the past year? No No No No No     Depression screen [facility] 2/9 05/27/2018 02/28/2018 02/03/2017  Decreased Interest 0 0 0  Down, Depressed, Hopeless 0 0 0  PHQ - 2 Score 0 0 0    Allergies  Allergen Reactions  . Gadolinium Derivatives Nausea And Vomiting  . Ivp Dye [Iodinated Diagnostic Agents] Nausea Only  . Lactose Intolerance (Gi) Other (See Comments)    GI UPSET  . Percocet [Oxycodone-Acetaminophen] Other (See Comments)    "SKIN CRAWLING" SENSATION    Prior to Admission medications   Medication Sig Start Date End Date Taking? Authorizing Provider  aspirin EC 81 MG tablet Take 81 mg by mouth daily.   Yes [provider]  cholecalciferol (VITAMIN D) 1000 UNITS tablet Take 1,000 Units by mouth daily.   Yes [provider]  ibuprofen (ADVIL,MOTRIN) 200 MG tablet Take 200 mg by mouth every 6 (six) hours  as needed.   Yes [provider]  Multiple Vitamins-Minerals (MULTIVITAMIN ADULT PO) Take 1 tablet by mouth daily. Reported on 01/18/2016   Yes [provider]  loratadine (CLARITIN) 10 MG tablet Take 10 mg by mouth daily.    [provider]    Past Medical History:  Diagnosis Date  . Arthritis of big toe    left  . Bulging of cervical intervertebral disc   . Chronic ethmoidal sinusitis 12/2016  . Chronic maxillary sinusitis 12/2016  . Dental crowns present   . Deviated nasal septum 12/2016  . Family history of adverse reaction to anesthesia    pt's mother has hx. of being hard to wake up post-op  . Lip laceration 12/28/2016   no stitches required, per pt.  . 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 Sinus headache     Past Surgical History:  Procedure Laterality Date  . CHOLECYSTECTOMY  2016  . COLECTOMY  2013  . COLOSTOMY  2013  . COLOSTOMY REVERSAL  2014  . LEG SURGERY Left 08/12/2005   lateral compartment release  . NASAL SEPTOPLASTY W/ TURBINOPLASTY Bilateral 01/04/2017   Procedure: NASAL SEPTOPLASTY WITH TURBINATE REDUCTION;  Surgeon: Izora Gala, MD;  Location: [facility];  Service: ENT;  Laterality: Bilateral;  . NASAL SINUS  SURGERY Bilateral 01/04/2017   Procedure: ENDOSCOPIC SINUS SURGERY;  Surgeon: Izora Gala, MD;  Location: [place];  Service: ENT;  Laterality: Bilateral;  . UMBILICAL HERNIA REPAIR  2016   x 2  . URETERAL STENT PLACEMENT  2014    Social History   Tobacco Use  . Smoking status: Never Smoker  . Smokeless tobacco: Never Used  Substance Use Topics  . Alcohol use: Yes    Alcohol/week: 0.0 standard drinks    Comment: socially    Family History  Problem Relation Age of Onset  . Breast cancer Mother   . Arthritis Mother   . Anesthesia problems Mother        hard to wake up post-op  . Arthritis Father   . Diabetes Maternal Grandmother   . Hypertension Maternal  Grandmother   . Diabetes Paternal Grandmother   . Diabetes Paternal Grandfather   . Diabetes Paternal Aunt     ROS Per hpi  OBJECTIVE:  Blood pressure 117/78, pulse (!) 117, temperature (!) 102.1 F (38.9 C), temperature source Oral, resp. rate 18, height 6\' 2"  (1.88 m), weight 259 lb 3.2 oz (117.6 kg), SpO2 99 %. Body mass index is 33.28 kg/m.   Physical Exam  Constitutional: He is oriented to person, place, and time. He appears well-developed and well-nourished.  HENT:  Head: Normocephalic and atraumatic.  Right Ear: Hearing, tympanic membrane, external ear and ear canal normal.  Left Ear: Hearing, tympanic membrane, external ear and ear canal normal.  Nose: Rhinorrhea present. Right sinus exhibits frontal sinus tenderness. Right sinus exhibits no maxillary sinus tenderness. Left sinus exhibits maxillary sinus tenderness and frontal sinus tenderness.  Mouth/Throat: Oropharynx is clear and moist. No oropharyngeal exudate.  Eyes: Pupils are equal, round, and reactive to light. Conjunctivae and EOM are normal.  Neck: Neck supple.  Cardiovascular: Normal rate and regular rhythm. Exam reveals no gallop and no friction rub.  No murmur heard. Pulmonary/Chest: Effort normal and breath sounds normal. He has no wheezes. He has no rales.  Abdominal: Soft. Bowel sounds are normal. He exhibits no distension. There is no hepatosplenomegaly. There is no tenderness. There is no CVA tenderness.  Musculoskeletal: He exhibits no edema.  Lymphadenopathy:    He has no cervical adenopathy.  Neurological: He is alert and oriented to person, place, and time.  Skin: Skin is warm. Rash (scattered mild folliculitis) noted. He is diaphoretic.  Psychiatric: He has a normal mood and affect.  Nursing note and vitals reviewed.   Results for orders placed or performed in visit on 05/27/18 (from the past 24 hour(s))  POC Influenza A&B(BINAX/QUICKVUE)     Status: None   Collection Time: 05/27/18  2:16 PM    Result Value Ref Range   Influenza A, POC Negative Negative   Influenza B, POC Negative Negative  POCT CBC     Status: Abnormal   Collection Time: 05/27/18  2:35 PM  Result Value Ref Range   WBC 10.9 (A) 4.6 - 10.2 K/uL   Lymph, poc 0.9 0.6 - 3.4   POC LYMPH PERCENT 8.2 (A) 10 - 50 %L   MID (cbc) 0.4 0 - 0.9   POC MID % 3.9 0 - 12 %M   POC Granulocyte 9.6 (A) 2 - 6.9   Granulocyte percent 87.9 (A) 37 - 80 %G   RBC 5.29 4.69 - 6.13 M/uL   Hemoglobin 15.9 14.1 - 18.1 g/dL   HCT, POC 46.5 43.5 - 53.7 %   MCV  87.8 80 - 97 fL   MCH, POC 30.0 27 - 31.2 pg   MCHC 34.2 31.8 - 35.4 g/dL   RDW, POC 13.3 %   Platelet Count, POC 179 142 - 424 K/uL   MPV 7.1 0 - 99.8 fL  POCT urinalysis dipstick     Status: Abnormal   Collection Time: 05/27/18  2:37 PM  Result Value Ref Range   Color, UA yellow yellow   Clarity, UA clear clear   Glucose, UA negative negative mg/dL   Bilirubin, UA negative negative   Ketones, POC UA negative negative mg/dL   Spec Grav, UA 1.020 1.010 - 1.025   Blood, UA negative negative   pH, UA 7.0 5.0 - 8.0   Protein Ur, POC negative negative mg/dL   Urobilinogen, UA 0.2 0.2 or 1.0 E.U./dL   Nitrite, UA Negative Negative   Leukocytes, UA Trace (A) Negative  POCT Microscopic Urinalysis ([facility])     Status: Abnormal   Collection Time: 05/27/18  2:42 PM  Result Value Ref Range   WBC,UR,HPF,POC Moderate (A) None WBC/hpf   RBC,UR,HPF,POC None None RBC/hpf   Bacteria None None, Too numerous to count   Mucus Absent Absent   Epithelial Cells, UR Per Microscopy None None, Too numerous to count cells/hpf    Dg Chest 2 View  Result Date: 05/27/2018 CLINICAL DATA:  Fever EXAM: CHEST - 2 VIEW COMPARISON:  06/17/2017 FINDINGS: The heart size and mediastinal contours are within normal limits. Both lungs are clear. The visualized skeletal structures are unremarkable. IMPRESSION: No active cardiopulmonary disease. Electronically Signed   By: Franchot Gallo M.D.   On: 05/27/2018  14:56     ASSESSMENT and PLAN  1. Fever, unspecified - POC Influenza A&B(BINAX/QUICKVUE) - acetaminophen (TYLENOL) tablet 1,000 mg - POCT urinalysis dipstick - POCT Microscopic Urinalysis ([facility]) - POCT CBC - DG Chest 2 View; Future - Urine Culture  Patient febrile with mild elevation of WBC with left shift, exam suggestive only of sinusitis. Urine with WBC but no bacteria. Therefore treating for sinusitis. Strict ER precautions given.   Other orders - amoxicillin-clavulanate (AUGMENTIN) 875-125 MG tablet; Take 1 tablet by mouth 2 (two) times daily.  Return if symptoms worsen or fail to improve.    Rutherford Guys, MD [facility] [place], [place] [address] Ph.  [phone] Fax [phone]

## 2018-05-27 NOTE — Telephone Encounter (Signed)
Pt c/o rash, low grade fever, and burning with urination.  Pt described rash as red, "zit like spots" the size of a pinhead. Rash is located below neck, front right shoulder to to right chest and bilateral forearms. Rash began 4 days ago. Denies itching.  Pt has had a low grade fever that started 2 days ago. Pt's temperature this am was 99.5.  Early this morning pt stated that he began having burning with urination. Denies flank pain, blood in urine, penile discharge, scrotal pain. Pt denies frequency or urgency.  No availability with PCP, appt given for today with Dr Pamella Pert at 1:40 pm for evaluation.   Reason for Disposition . Mild widespread rash . All other males with painful urination . All other urine symptoms  Additional Information . Negative: Fever    Low grade fever 99.5  Answer Assessment - Initial Assessment Questions 1. APPEARANCE of RASH: "Describe the rash." (e.g., spots, blisters, raised areas, skin peeling, scaly)     Some have white heads, bumps red and  2. SIZE: "How big are the spots?" (e.g., tip of pen, eraser, coin; inches, centimeters)     Pinhead but some are bigger- "like a Zit" 3. LOCATION: "Where is the rash located?"     Below neck, right shoulder, right chest, bilateral forearms 4. COLOR: "What color is the rash?" (Note: It is difficult to assess rash color in people with darker-colored skin. When this situation occurs, simply ask the caller to describe what they see.)     Rash is red 5. ONSET: "When did the rash begin?"     4 days ago 6. FEVER: "Do you have a fever?" If so, ask: "What is your temperature, how was it measured, and when did it start?"     Yes 99.5 7. ITCHING: "Does the rash itch?" If so, ask: "How bad is the itch?" (Scale 1-10; or mild, moderate, severe)     no 8. CAUSE: "What do you think is causing the rash?"     Pt doesn't know 9. MEDICATION FACTORS: "Have you started any new medications within the last 2 weeks?" (e.g., antibiotics)     4 weeks ago on steroids  10. OTHER SYMPTOMS: "Do you have any other symptoms?" (e.g., dizziness, headache, sore throat, joint pain)       Pain with urination, low grade fever 11. PREGNANCY: "Is there any chance you are pregnant?" "When was your last menstrual period?"       n/a  Answer Assessment - Initial Assessment Questions 1. SEVERITY: "How bad is the pain?"  (e.g., Scale 1-10; mild, moderate, or severe)   - MILD (1-3): complains slightly about urination hurting   - MODERATE (4-7): interferes with normal activities     - SEVERE (8-10): excruciating, unwilling or unable to urinate because of the pain      mild 2. FREQUENCY: "How many times have you had painful urination today?"      3 times 3. PATTERN: "Is pain present every time you urinate or just sometimes?"      Every time 4. ONSET: "When did the painful urination start?"      This am  5. FEVER: "Do you have a fever?" If so, ask: "What is your temperature, how was it measured, and when did it start?"     99.5 oral thermometer 6. PAST UTI: "Have you had a urine infection before?" If so, ask: "When was the last time?" and "What happened that time?"  Yes-antibiotics 7. CAUSE: "What do you think is causing the painful urination?"      UTI 8. OTHER SYMPTOMS: "Do you have any other symptoms?" (e.g., flank pain, penile discharge, scrotal pain, blood in urine)     rash  Answer Assessment - Initial Assessment Questions 1. SYMPTOM: "What's the main symptom you're concerned about?" (e.g., frequency, incontinence)     burning 2. ONSET: "When did the  burning  start?"     today 3. PAIN: "Is there any pain?" If so, ask: "How bad is it?" (Scale: 1-10; mild, moderate, severe)     mild 4. CAUSE: "What do you think is causing the symptoms?"     UTI 5. OTHER SYMPTOMS: "Do you have any other symptoms?" (e.g., fever, flank pain, blood in urine, pain with urination)     99.5 fever,, pain with urination, rash 6. PREGNANCY: "Is there any  chance you are pregnant?" "When was your last menstrual period?"     n/a  Protocols used: RASH OR REDNESS - WIDESPREAD-A-AH, URINATION PAIN - MALE-A-AH, URINARY SYMPTOMS-A-AH

## 2018-05-27 NOTE — ED Triage Notes (Addendum)
Pt seen at [facility] today, diagnosed with sinusitis, and told to come to ED if he got worse.  Reports rash to chest x 4 days and red bumps on arms x 3 days that he states he was told today was a heat rash.  Reports fever and chills x 2 days.  C/o burning with urination that started last night.  C/o headache since 11am and L flank pain x 40 min.  Denies sob, vomiting, and diarrhea.  Reports mild nausea.  Last Tylenol 1000mg  at 2:45pm.  Started on Augmentin due to elevated WBC today.

## 2018-05-27 NOTE — Patient Instructions (Signed)
° ° ° °  If you have lab work done today you will be contacted with your lab results within the next 2 weeks.  If you have not heard from us then please contact us. The fastest way to get your results is to register for My Chart. ° ° °IF you received an x-ray today, you will receive an invoice from [facility]. Please contact [facility] at [phone] with questions or concerns regarding your invoice.  ° °IF you received labwork today, you will receive an invoice from [facility]. Please contact [facility] at [phone] with questions or concerns regarding your invoice.  ° °Our billing staff will not be able to assist you with questions regarding bills from these companies. ° °You will be contacted with the lab results as soon as they are available. The fastest way to get your results is to activate your My Chart account. Instructions are located on the last page of this paperwork. If you have not heard from us regarding the results in 2 weeks, please contact this office. °  ° ° ° °

## 2018-05-28 DIAGNOSIS — R509 Fever, unspecified: Secondary | ICD-10-CM | POA: Diagnosis not present

## 2018-05-28 DIAGNOSIS — Z79899 Other long term (current) drug therapy: Secondary | ICD-10-CM | POA: Diagnosis not present

## 2018-05-28 LAB — I-STAT CG4 LACTIC ACID, ED: LACTIC ACID, VENOUS: 0.81 mmol/L (ref 0.5–1.9)

## 2018-05-28 MED ORDER — SODIUM CHLORIDE 0.9 % IV BOLUS
1000.0000 mL | Freq: Once | INTRAVENOUS | Status: AC
  Administered 2018-05-28: 1000 mL via INTRAVENOUS

## 2018-05-28 MED ORDER — IBUPROFEN 200 MG PO TABS
600.0000 mg | ORAL_TABLET | Freq: Once | ORAL | Status: AC
  Administered 2018-05-28: 600 mg via ORAL
  Filled 2018-05-28: qty 1

## 2018-05-28 NOTE — ED Provider Notes (Signed)
[facility] EMERGENCY DEPARTMENT Provider Note   CSN: [id number] Arrival date & time: 05/27/18  2057     History   Chief Complaint Chief Complaint  Patient presents with  . Flank Pain  . Fever    HPI Paul Humphrey is a 32 y.o. male.  Patient is a 32 year old male with history of sinusitis, prior abdominal injury requiring ostomy with reversal.  He presents today with complaints of fever, chills, and body aches.  This started earlier today.  He reports fever at home to 102.7.  He also reports some dysuria.  He was seen by his primary doctor earlier today and started on Augmentin for possible sinus infection.  After returning home, his fever spiked and presents for evaluation of this.  The history is provided by the patient.  Fever   This is a new problem. Episode onset: 24 hours ago. The problem occurs constantly. The problem has been gradually worsening. The maximum temperature noted was 102 to 102.9 F. The temperature was taken using an oral thermometer. Associated symptoms include headaches and muscle aches. Pertinent negatives include no chest pain, no diarrhea, no vomiting, no sore throat and no cough. He has tried acetaminophen for the symptoms. The treatment provided no relief.    Past Medical History:  Diagnosis Date  . Arthritis of big toe    left  . Bulging of cervical intervertebral disc   . Chronic ethmoidal sinusitis 12/2016  . Chronic maxillary sinusitis 12/2016  . Dental crowns present   . Deviated nasal septum 12/2016  . Family history of adverse reaction to anesthesia    pt's mother has hx. of being hard to wake up post-op  . Lip laceration 12/28/2016   no stitches required, per pt.  . 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 Sinus headache     Patient Active Problem List   Diagnosis Date Noted  . Pain in both hands 11/23/2016  . Great toe pain, left 11/23/2016  . GERD (gastroesophageal reflux disease) 03/18/2015  .  Abdominal pain 01/07/2015  . Tingling 01/07/2015  . Suspected exposure to heavy metals 01/07/2015  . Vitamin D deficiency 01/07/2015    Past Surgical History:  Procedure Laterality Date  . CHOLECYSTECTOMY  2016  . COLECTOMY  2013  . COLOSTOMY  2013  . COLOSTOMY REVERSAL  2014  . LEG SURGERY Left 08/12/2005   lateral compartment release  . NASAL SEPTOPLASTY W/ TURBINOPLASTY Bilateral 01/04/2017   Procedure: NASAL SEPTOPLASTY WITH TURBINATE REDUCTION;  Surgeon: Izora Gala, MD;  Location: [facility];  Service: ENT;  Laterality: Bilateral;  . NASAL SINUS SURGERY Bilateral 01/04/2017   Procedure: ENDOSCOPIC SINUS SURGERY;  Surgeon: Izora Gala, MD;  Location: [facility];  Service: ENT;  Laterality: Bilateral;  . UMBILICAL HERNIA REPAIR  2016   x 2  . URETERAL STENT PLACEMENT  2014        Home Medications    Prior to Admission medications   Medication Sig Start Date End Date Taking? Authorizing Provider  amoxicillin-clavulanate (AUGMENTIN) 875-125 MG tablet Take 1 tablet by mouth 2 (two) times daily. 05/27/18   Rutherford Guys, MD  aspirin EC 81 MG tablet Take 81 mg by mouth daily.    [provider]  cholecalciferol (VITAMIN D) 1000 UNITS tablet Take 1,000 Units by mouth daily.    [provider]  ibuprofen (ADVIL,MOTRIN) 200 MG tablet Take 200 mg by mouth every 6 (six) hours as needed.  [provider]  loratadine (CLARITIN) 10 MG tablet Take 10 mg by mouth daily.    [provider]  Multiple Vitamins-Minerals (MULTIVITAMIN ADULT PO) Take 1 tablet by mouth daily. Reported on 01/18/2016    [provider]    Family History Family History  Problem Relation Age of Onset  . Breast cancer Mother   . Arthritis Mother   . Anesthesia problems Mother        hard to wake up post-op  . Arthritis Father   . Diabetes Maternal Grandmother   . Hypertension Maternal Grandmother   . Diabetes Paternal Grandmother     . Diabetes Paternal Grandfather   . Diabetes Paternal Aunt     Social History Social History   Tobacco Use  . Smoking status: Never Smoker  . Smokeless tobacco: Never Used  Substance Use Topics  . Alcohol use: Yes    Alcohol/week: 0.0 standard drinks    Comment: socially  . Drug use: No     Allergies   Gadolinium derivatives; Ivp dye [iodinated diagnostic agents]; Lactose intolerance (gi); and Percocet [oxycodone-acetaminophen]   Review of Systems Review of Systems  Constitutional: Positive for fever.  HENT: Negative for sore throat.   Respiratory: Negative for cough.   Cardiovascular: Negative for chest pain.  Gastrointestinal: Negative for diarrhea and vomiting.  Neurological: Positive for headaches.  All other systems reviewed and are negative.    Physical Exam Updated Vital Signs BP 121/77   Pulse (!) 101   Temp 100 F (37.8 C) (Oral)   Resp 11   Ht 6\' 2"  (1.88 m)   Wt 117.6 kg   SpO2 95%   BMI 33.28 kg/m   Physical Exam  Constitutional: He is oriented to person, place, and time. He appears well-developed and well-nourished. No distress.  HENT:  Head: Normocephalic and atraumatic.  Mouth/Throat: Oropharynx is clear and moist. No oropharyngeal exudate.  TMs are clear bilaterally.  Neck: Normal range of motion. Neck supple.  Cardiovascular: Normal rate and regular rhythm. Exam reveals no friction rub.  No murmur heard. Pulmonary/Chest: Effort normal and breath sounds normal. No respiratory distress. He has no wheezes. He has no rales.  Abdominal: Soft. Bowel sounds are normal. He exhibits no distension. There is no tenderness.  Musculoskeletal: Normal range of motion. He exhibits no edema.  Neurological: He is alert and oriented to person, place, and time. Coordination normal.  Skin: Skin is warm and dry. He is not diaphoretic.  Nursing note and vitals reviewed.    ED Treatments / Results  Labs (all labs ordered are listed, but only abnormal  results are displayed) Labs Reviewed  COMPREHENSIVE METABOLIC PANEL - Abnormal; Notable for the following components:      Result Value   CO2 20 (*)    Glucose, Bld 113 (*)    ALT 45 (*)    Alkaline Phosphatase 35 (*)    Total Bilirubin 1.4 (*)    All other components within normal limits  CBC WITH DIFFERENTIAL/PLATELET - Abnormal; Notable for the following components:   WBC 11.8 (*)    Neutro Abs 9.9 (*)    Monocytes Absolute 1.2 (*)    All other components within normal limits  URINALYSIS, ROUTINE W REFLEX MICROSCOPIC - Abnormal; Notable for the following components:   Hgb urine dipstick SMALL (*)    Leukocytes, UA SMALL (*)    All other components within normal limits  I-STAT CG4 LACTIC ACID, ED - Abnormal; Notable for the following components:  Lactic Acid, Venous 1.94 (*)    All other components within normal limits  URINE CULTURE  CULTURE, BLOOD (ROUTINE X 2)  CULTURE, BLOOD (ROUTINE X 2)  I-STAT CG4 LACTIC ACID, ED    EKG None  Radiology Dg Chest 2 View  Result Date: 05/27/2018 CLINICAL DATA:  Sinusitis, fever, rash EXAM: CHEST - 2 VIEW COMPARISON:  05/27/2018 FINDINGS: The heart size and mediastinal contours are within normal limits. Both lungs are clear. The visualized skeletal structures are unremarkable. Trachea is midline. Normal heart size and vascularity. Remote cholecystectomy. IMPRESSION: No active cardiopulmonary disease. Electronically Signed   By: Jerilynn Mages.  Shick M.D.   On: 05/27/2018 22:02   Dg Chest 2 View  Result Date: 05/27/2018 CLINICAL DATA:  Fever EXAM: CHEST - 2 VIEW COMPARISON:  06/17/2017 FINDINGS: The heart size and mediastinal contours are within normal limits. Both lungs are clear. The visualized skeletal structures are unremarkable. IMPRESSION: No active cardiopulmonary disease. Electronically Signed   By: Franchot Gallo M.D.   On: 05/27/2018 14:56    Procedures Procedures (including critical care time)  Medications Ordered in ED Medications    sodium chloride 0.9 % bolus 1,000 mL (has no administration in time range)  ibuprofen (ADVIL,MOTRIN) tablet 600 mg (has no administration in time range)  acetaminophen (TYLENOL) tablet 650 mg (650 mg Oral Given 05/27/18 2137)     Initial Impression / Assessment and Plan / ED Course  I have reviewed the triage vital signs and the nursing notes.  Pertinent labs & imaging results that were available during my care of the patient were reviewed by me and considered in my medical decision making (see chart for details).  Patient presenting with fevers, chills, and body aches throughout the last 24 hours.  I suspect a viral etiology.  The patient did have some dental work performed several days ago, so blood cultures were obtained.  His white count is 12,000 and temperature here was up to 102.7.  It did respond nicely to ibuprofen and IV fluids.  He is now afebrile and feeling better.  He will be discharged with rotating Tylenol and Motrin, and follow-up as needed.  He was started on Augmentin earlier today by his primary doctor and he will continue this medication as well.  Final Clinical Impressions(s) / ED Diagnoses   Final diagnoses:  None    ED Discharge Orders    None       Veryl Speak, MD 05/28/18 [id number]

## 2018-05-28 NOTE — Discharge Instructions (Addendum)
Tylenol 1000 mg rotated with ibuprofen 600 mg every 3-4 hours as needed for fever.  Plenty of fluids and get plenty of rest.  We will call you if your cultures indicate you require further treatment or action.  Return to the emergency department if symptoms significantly worsen or change.

## 2018-05-29 LAB — URINE CULTURE: CULTURE: NO GROWTH

## 2018-06-02 ENCOUNTER — Encounter (HOSPITAL_COMMUNITY): Payer: Self-pay | Admitting: Emergency Medicine

## 2018-06-02 ENCOUNTER — Ambulatory Visit (HOSPITAL_COMMUNITY)
Admission: EM | Admit: 2018-06-02 | Discharge: 2018-06-02 | Disposition: A | Discharge status: Home / Self Care | Payer: BLUE CROSS/BLUE SHIELD | Attending: Family Medicine | Admitting: Family Medicine

## 2018-06-02 ENCOUNTER — Other Ambulatory Visit: Payer: Self-pay

## 2018-06-02 ENCOUNTER — Ambulatory Visit (INDEPENDENT_AMBULATORY_CARE_PROVIDER_SITE_OTHER): Payer: BLUE CROSS/BLUE SHIELD

## 2018-06-02 DIAGNOSIS — R0781 Pleurodynia: Secondary | ICD-10-CM | POA: Diagnosis not present

## 2018-06-02 DIAGNOSIS — S2231XA Fracture of one rib, right side, initial encounter for closed fracture: Secondary | ICD-10-CM | POA: Diagnosis not present

## 2018-06-02 LAB — CULTURE, BLOOD (ROUTINE X 2)
Culture: NO GROWTH
Culture: NO GROWTH

## 2018-06-02 NOTE — ED Triage Notes (Signed)
Fall today around 1:45 pm.  Pain worsened throughout the afternoon. Right shoulder surgery-march 2019.  Today patient fell.  Patient was standing on a chair, chair flipped and patient fell.   This is workers comp

## 2018-06-02 NOTE — ED Provider Notes (Signed)
[place]    CSN: [id number] Arrival date & time: 06/02/18  1924     History   Chief Complaint Chief Complaint  Patient presents with  . Fall    HPI Paul Humphrey is a 32 y.o. male.   He is presenting with right mid axilla rib pain after a fall at work.  He was fixing a projector and hit the side of the chair.  This occurred this afternoon.  Since that time he has had ongoing pain.  He denies any shortness of breath.  He does have some radiation posteriorly to his back.  The pain in the back is sharp.  He denies any swelling or ecchymosis.  He has a history of a left shoulder arthroscopy.   HPI  Past Medical History:  Diagnosis Date  . Arthritis of big toe    left  . Bulging of cervical intervertebral disc   . Chronic ethmoidal sinusitis 12/2016  . Chronic maxillary sinusitis 12/2016  . Dental crowns present   . Deviated nasal septum 12/2016  . Family history of adverse reaction to anesthesia    pt's mother has hx. of being hard to wake up post-op  . Lip laceration 12/28/2016   no stitches required, per pt.  . 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 Sinus headache     Patient Active Problem List   Diagnosis Date Noted  . Pain in both hands 11/23/2016  . Great toe pain, left 11/23/2016  . GERD (gastroesophageal reflux disease) 03/18/2015  . Abdominal pain 01/07/2015  . Tingling 01/07/2015  . Suspected exposure to heavy metals 01/07/2015  . Vitamin D deficiency 01/07/2015    Past Surgical History:  Procedure Laterality Date  . CHOLECYSTECTOMY  2016  . COLECTOMY  2013  . COLOSTOMY  2013  . COLOSTOMY REVERSAL  2014  . LEG SURGERY Left 08/12/2005   lateral compartment release  . NASAL SEPTOPLASTY W/ TURBINOPLASTY Bilateral 01/04/2017   Procedure: NASAL SEPTOPLASTY WITH TURBINATE REDUCTION;  Surgeon: Izora Gala, MD;  Location: [place];  Service: ENT;  Laterality: Bilateral;  . NASAL SINUS SURGERY Bilateral  01/04/2017   Procedure: ENDOSCOPIC SINUS SURGERY;  Surgeon: Izora Gala, MD;  Location: [place];  Service: ENT;  Laterality: Bilateral;  . UMBILICAL HERNIA REPAIR  2016   x 2  . URETERAL STENT PLACEMENT  2014       Home Medications    Prior to Admission medications   Medication Sig Start Date End Date Taking? Authorizing Provider  amoxicillin-clavulanate (AUGMENTIN) 875-125 MG tablet Take 1 tablet by mouth 2 (two) times daily. 05/27/18  Yes Rutherford Guys, MD  ibuprofen (ADVIL,MOTRIN) 600 MG tablet Take 600 mg by mouth every 6 (six) hours as needed.   Yes [provider]  cholecalciferol (VITAMIN D) 1000 UNITS tablet Take 1,000 Units by mouth every other day.     [provider]  Multiple Vitamins-Minerals (MULTIVITAMIN ADULT PO) Take 1 tablet by mouth daily. Reported on 01/18/2016    [provider]    Family History Family History  Problem Relation Age of Onset  . Breast cancer Mother   . Arthritis Mother   . Anesthesia problems Mother        hard to wake up post-op  . Arthritis Father   . Diabetes Maternal Grandmother   . Hypertension Maternal Grandmother   . Diabetes Paternal Grandmother   . Diabetes Paternal Grandfather   . Diabetes Paternal Aunt  Social History Social History   Tobacco Use  . Smoking status: Never Smoker  . Smokeless tobacco: Never Used  Substance Use Topics  . Alcohol use: Yes    Alcohol/week: 0.0 standard drinks    Comment: socially  . Drug use: No     Allergies   Gadolinium derivatives; Ivp dye [iodinated diagnostic agents]; Lactose intolerance (gi); and Percocet [oxycodone-acetaminophen]   Review of Systems Review of Systems  Constitutional: Negative for fever.  HENT: Negative for congestion.   Respiratory: Negative for cough and shortness of breath.   Cardiovascular: Negative for chest pain.  Gastrointestinal: Negative for abdominal pain.  Genitourinary: Positive for flank pain.    Musculoskeletal: Positive for back pain.  Skin: Negative for color change.  Neurological: Negative for weakness.  Hematological: Negative for adenopathy.  Psychiatric/Behavioral: Negative for agitation.     Physical Exam Triage Vital Signs ED Triage Vitals  Enc Vitals Group     BP 06/02/18 1952 129/69     Pulse Rate 06/02/18 1952 85     Resp 06/02/18 1952 18     Temp 06/02/18 1952 98 F (36.7 C)     Temp Source 06/02/18 1952 Oral     SpO2 06/02/18 1952 100 %     Weight --      Height --      Head Circumference --      Peak Flow --      Pain Score 06/02/18 1950 4     Pain Loc --      Pain Edu? --      Excl. in [place]? --    No data found.  Updated Vital Signs BP 129/69 (BP Location: Left Arm)   Pulse 85   Temp 98 F (36.7 C) (Oral)   Resp 18   SpO2 100%   Visual Acuity Right Eye Distance:   Left Eye Distance:   Bilateral Distance:    Right Eye Near:   Left Eye Near:    Bilateral Near:     Physical Exam Gen: NAD, alert, cooperative with exam, well-appearing ENT: normal lips, normal nasal mucosa,  Eye: normal EOM, normal conjunctiva and lids CV:  no edema, +2 pedal pulses   Resp: no accessory muscle use, non-labored, clear to auscultation bilaterally, no diminished breath sounds Skin: no rashes, no areas of induration  Neuro: normal tone, normal sensation to touch Psych:  normal insight, alert and oriented MSK:  Right shoulder/flank. Tenderness to palpation in the mid axillary line around the fifth and sixth rib. No overlying ecchymosis. Normal right shoulder range of motion. Normal strength resistance with internal and external rotation. Normal external rotation. Normal empty can testing. No crepitus palpated. Neurovascular intact   UC Treatments / Results  Labs (all labs ordered are listed, but only abnormal results are displayed) Labs Reviewed - No data to display  EKG None  Radiology No results found.  Procedures Procedures (including  critical care time)  Medications Ordered in UC Medications - No data to display  Initial Impression / Assessment and Plan / UC Course  I have reviewed the triage vital signs and the nursing notes.  Pertinent labs & imaging results that were available during my care of the patient were reviewed by me and considered in my medical decision making (see chart for details).     He is presenting with right rib pain after a fall that occurred at work.  He landed on a chair on the side.  X-rays showed a possible rib  fracture.  He was counseled on pain control and supportive care.  He can follow-up at occupational health if he has no improvement of his symptoms.  He was sent home with no lifting greater than 5 to 10 pounds.  This was for a week.  He was given instructions on how to follow-up.   Final Clinical Impressions(s) / UC Diagnoses   Final diagnoses:  Rib pain on right side     Discharge Instructions     Please take an anti-inflammatory or Tylenol for pain. Please try to ice the area. Please try to use compression to wraparound to help with the pain. Please follow-up with the office on [facility] if your pain doesn't improve.     ED Prescriptions    None     Controlled Substance Prescriptions [place] Controlled Substance Registry consulted? Not Applicable   Rosemarie Ax, MD 06/02/18 2125

## 2018-06-02 NOTE — Discharge Instructions (Addendum)
Please take an anti-inflammatory or Tylenol for pain. Please try to ice the area. Please try to use compression to wraparound to help with the pain. Please follow-up with the office on [facility] if your pain doesn't improve.

## 2018-06-15 ENCOUNTER — Ambulatory Visit: Payer: BLUE CROSS/BLUE SHIELD | Admitting: Physician Assistant

## 2018-06-16 ENCOUNTER — Ambulatory Visit: Payer: Self-pay

## 2018-06-16 ENCOUNTER — Other Ambulatory Visit: Payer: Self-pay | Admitting: Family Medicine

## 2018-06-16 DIAGNOSIS — S20211D Contusion of right front wall of thorax, subsequent encounter: Secondary | ICD-10-CM

## 2018-06-19 DIAGNOSIS — B9689 Other specified bacterial agents as the cause of diseases classified elsewhere: Secondary | ICD-10-CM | POA: Diagnosis not present

## 2018-06-19 DIAGNOSIS — J019 Acute sinusitis, unspecified: Secondary | ICD-10-CM | POA: Diagnosis not present

## 2018-07-22 ENCOUNTER — Telehealth: Payer: Self-pay | Admitting: Family Medicine

## 2018-07-22 NOTE — Telephone Encounter (Signed)
LVM for pt to call the office and reschedule their appt on 08/30/18 with Dr. Greene. When pt calls back, please schedule at their convenience.  °

## 2018-07-29 ENCOUNTER — Ambulatory Visit: Payer: BLUE CROSS/BLUE SHIELD | Admitting: Family Medicine

## 2018-08-02 ENCOUNTER — Ambulatory Visit: Payer: BLUE CROSS/BLUE SHIELD | Admitting: Family

## 2018-08-02 ENCOUNTER — Encounter: Payer: Self-pay | Admitting: Family

## 2018-08-02 VITALS — BP 114/82 | HR 90 | Temp 98.4°F | Ht 74.0 in | Wt 269.1 lb

## 2018-08-02 DIAGNOSIS — R05 Cough: Secondary | ICD-10-CM | POA: Diagnosis not present

## 2018-08-02 DIAGNOSIS — R059 Cough, unspecified: Secondary | ICD-10-CM

## 2018-08-02 LAB — POCT EXHALED NITRIC OXIDE: FENO LEVEL (PPB): 10

## 2018-08-02 MED ORDER — FLUTICASONE PROPIONATE 50 MCG/ACT NA SUSP: 2.0000 | Freq: Every day | NASAL | 6 refills | Status: DC

## 2018-08-02 NOTE — Progress Notes (Signed)
Paul Humphrey is a 32 y.o. male with the following history as recorded in EpicCare:  Patient Active Problem List   Diagnosis Date Noted  . Pain in both hands 11/23/2016  . Great toe pain, left 11/23/2016  . GERD (gastroesophageal reflux disease) 03/18/2015  . Abdominal pain 01/07/2015  . Tingling 01/07/2015  . Suspected exposure to heavy metals 01/07/2015  . Vitamin D deficiency 01/07/2015    Current Outpatient Medications  Medication Sig Dispense Refill  . cholecalciferol (VITAMIN D) 1000 UNITS tablet Take 1,000 Units by mouth every other day.     . Multiple Vitamins-Minerals (MULTIVITAMIN ADULT PO) Take 1 tablet by mouth daily. Reported on 01/18/2016    . fluticasone (FLONASE) 50 MCG/ACT nasal spray Place 2 sprays into both nostrils daily. 16 g 6   No current facility-administered medications for this visit.     Allergies: Gadolinium derivatives; Ivp dye [iodinated diagnostic agents]; Lactose intolerance (gi); and Percocet [oxycodone-acetaminophen]  Past Medical History:  Diagnosis Date  . Arthritis of big toe    left  . Bulging of cervical intervertebral disc   . Chronic ethmoidal sinusitis 12/2016  . Chronic maxillary sinusitis 12/2016  . Dental crowns present   . Deviated nasal septum 12/2016  . Family history of adverse reaction to anesthesia    pt's mother has hx. of being hard to wake up post-op  . Lip laceration 12/28/2016   no stitches required, per pt.  . 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 Sinus headache     Past Surgical History:  Procedure Laterality Date  . CHOLECYSTECTOMY  2016  . COLECTOMY  2013  . COLOSTOMY  2013  . COLOSTOMY REVERSAL  2014  . LEG SURGERY Left 08/12/2005   lateral compartment release  . NASAL SEPTOPLASTY W/ TURBINOPLASTY Bilateral 01/04/2017   Procedure: NASAL SEPTOPLASTY WITH TURBINATE REDUCTION;  Surgeon: Izora Gala, MD;  Location: [place];  Service: ENT;  Laterality: Bilateral;  . NASAL SINUS  SURGERY Bilateral 01/04/2017   Procedure: ENDOSCOPIC SINUS SURGERY;  Surgeon: Izora Gala, MD;  Location: [place];  Service: ENT;  Laterality: Bilateral;  . UMBILICAL HERNIA REPAIR  2016   x 2  . URETERAL STENT PLACEMENT  2014    Family History  Problem Relation Age of Onset  . Breast cancer Mother   . Arthritis Mother   . Anesthesia problems Mother        hard to wake up post-op  . Arthritis Father   . Diabetes Maternal Grandmother   . Hypertension Maternal Grandmother   . Diabetes Paternal Grandmother   . Diabetes Paternal Grandfather   . Diabetes Paternal Aunt     Social History   Tobacco Use  . Smoking status: Never Smoker  . Smokeless tobacco: Never Used  Substance Use Topics  . Alcohol use: Yes    Alcohol/week: 0.0 standard drinks    Comment: socially    Subjective:  Patient presents with cough x 1 month/ maybe longer; describes as a dry cough; describes cough as "intermittent"- notes he can go a full week without any symptoms.  Does have history of GERD- not currently taking medication; had normal CXR in early October; no fever, no night sweats, no weight loss; per patient, unable to determine any specific triggers. No wheezing; no chest pain, no shortness of breath on exertion, no wheezing; no increased burping or belching; Was treated for sinus infection at the end of October with 10 days of Augmentin, oral steroids  x 12 days- "felt fine"- notes this concerning cough "seems different."  Up to date on CPE; per information provided by patient, he has a PCP- Dr. [name] at [place]; using our office for urgent care needs today;    Objective:  Vitals:   08/02/18 0924  BP: 114/82  Pulse: 90  Temp: 98.4 F (36.9 C)  TempSrc: Oral  SpO2: 97%  Weight: 269 lb 1.3 oz (122.1 kg)  Height: 6\' 2"  (1.88 m)    General: Well developed, well nourished, in no acute distress  Skin : Warm and dry.  Head: Normocephalic and atraumatic  Eyes: Sclera and  conjunctiva clear; pupils round and reactive to light; extraocular movements intact  Ears: External normal; canals clear; tympanic membranes normal  Oropharynx: Pink, supple. No suspicious lesions  Neck: Supple without thyromegaly, adenopathy  Lungs: Respirations unlabored; clear to auscultation bilaterally without wheeze, rales, rhonchi  CVS exam: normal rate and regular rhythm.  Neurologic: Alert and oriented; speech intact; face symmetrical; moves all extremities well; CNII-XII intact without focal deficit   Assessment:  1. Cough     Plan:  FeNO score in office is 10- suspect allergy component; patient had a normal CXR in October 2019- do not feel another X-ray is needed today; trial of Flonase NS- use as directed; follow- up worse, no better- to consider pulmonology referral if symptoms persist.  He will need a CPE next summer- assume that will be managed by his stated PCP, Dr. Carlota Raspberry.   No follow-ups on file.  Orders Placed This Encounter  Procedures  . POCT EXHALED NITRIC OXIDE    Requested Prescriptions   Signed Prescriptions Disp Refills  . fluticasone (FLONASE) 50 MCG/ACT nasal spray 16 g 6    Sig: Place 2 sprays into both nostrils daily.

## 2018-08-02 NOTE — Patient Instructions (Signed)

## 2018-08-30 ENCOUNTER — Ambulatory Visit: Payer: BLUE CROSS/BLUE SHIELD | Admitting: Family Medicine

## 2018-09-23 ENCOUNTER — Ambulatory Visit (INDEPENDENT_AMBULATORY_CARE_PROVIDER_SITE_OTHER)
Admission: EM | Admit: 2018-09-23 | Discharge: 2018-09-23 | Disposition: A | Discharge status: Home / Self Care | Payer: BLUE CROSS/BLUE SHIELD | Source: Home / Self Care | Attending: Internal Medicine | Admitting: Internal Medicine

## 2018-09-23 ENCOUNTER — Encounter (HOSPITAL_COMMUNITY): Payer: Self-pay | Admitting: Emergency Medicine

## 2018-09-23 ENCOUNTER — Emergency Department (HOSPITAL_COMMUNITY)
Admission: EM | Admit: 2018-09-23 | Discharge: 2018-09-23 | Disposition: A | Discharge status: Home / Self Care | Payer: BLUE CROSS/BLUE SHIELD | Attending: Emergency Medicine | Admitting: Emergency Medicine

## 2018-09-23 ENCOUNTER — Emergency Department (HOSPITAL_COMMUNITY): Payer: BLUE CROSS/BLUE SHIELD

## 2018-09-23 ENCOUNTER — Encounter (HOSPITAL_COMMUNITY): Payer: Self-pay

## 2018-09-23 DIAGNOSIS — R109 Unspecified abdominal pain: Secondary | ICD-10-CM | POA: Insufficient documentation

## 2018-09-23 DIAGNOSIS — Z79899 Other long term (current) drug therapy: Secondary | ICD-10-CM | POA: Insufficient documentation

## 2018-09-23 DIAGNOSIS — K625 Hemorrhage of anus and rectum: Secondary | ICD-10-CM | POA: Insufficient documentation

## 2018-09-23 DIAGNOSIS — K922 Gastrointestinal hemorrhage, unspecified: Secondary | ICD-10-CM | POA: Diagnosis not present

## 2018-09-23 DIAGNOSIS — R1031 Right lower quadrant pain: Secondary | ICD-10-CM

## 2018-09-23 DIAGNOSIS — K6289 Other specified diseases of anus and rectum: Secondary | ICD-10-CM | POA: Diagnosis not present

## 2018-09-23 DIAGNOSIS — R197 Diarrhea, unspecified: Secondary | ICD-10-CM | POA: Diagnosis not present

## 2018-09-23 LAB — URINALYSIS, ROUTINE W REFLEX MICROSCOPIC
Bilirubin Urine: NEGATIVE
GLUCOSE, UA: NEGATIVE mg/dL
Hgb urine dipstick: NEGATIVE
KETONES UR: NEGATIVE mg/dL
LEUKOCYTES UA: NEGATIVE
Nitrite: NEGATIVE
PROTEIN: NEGATIVE mg/dL
Specific Gravity, Urine: 1.016 (ref 1.005–1.030)
pH: 6 (ref 5.0–8.0)

## 2018-09-23 LAB — CBC WITH DIFFERENTIAL/PLATELET
ABS IMMATURE GRANULOCYTES: 0.02 10*3/uL (ref 0.00–0.07)
BASOS ABS: 0 10*3/uL (ref 0.0–0.1)
BASOS PCT: 0 %
Eosinophils Absolute: 0.1 10*3/uL (ref 0.0–0.5)
Eosinophils Relative: 2 %
HCT: 47.2 % (ref 39.0–52.0)
HEMOGLOBIN: 16.2 g/dL (ref 13.0–17.0)
IMMATURE GRANULOCYTES: 0 %
LYMPHS PCT: 19 %
Lymphs Abs: 1.4 10*3/uL (ref 0.7–4.0)
MCH: 30.5 pg (ref 26.0–34.0)
MCHC: 34.3 g/dL (ref 30.0–36.0)
MCV: 88.7 fL (ref 80.0–100.0)
MONO ABS: 0.4 10*3/uL (ref 0.1–1.0)
Monocytes Relative: 6 %
NEUTROS ABS: 5.2 10*3/uL (ref 1.7–7.7)
NEUTROS PCT: 73 %
NRBC: 0 % (ref 0.0–0.2)
PLATELETS: 176 10*3/uL (ref 150–400)
RBC: 5.32 MIL/uL (ref 4.22–5.81)
RDW: 12.7 % (ref 11.5–15.5)
WBC: 7.2 10*3/uL (ref 4.0–10.5)

## 2018-09-23 LAB — COMPREHENSIVE METABOLIC PANEL
ALBUMIN: 4.1 g/dL (ref 3.5–5.0)
ALT: 35 U/L (ref 0–44)
ANION GAP: 8 (ref 5–15)
AST: 28 U/L (ref 15–41)
Alkaline Phosphatase: 31 U/L — ABNORMAL LOW (ref 38–126)
BILIRUBIN TOTAL: 0.9 mg/dL (ref 0.3–1.2)
BUN: 13 mg/dL (ref 6–20)
CHLORIDE: 109 mmol/L (ref 98–111)
CO2: 24 mmol/L (ref 22–32)
Calcium: 9.1 mg/dL (ref 8.9–10.3)
Creatinine, Ser: 1.1 mg/dL (ref 0.61–1.24)
Glucose, Bld: 90 mg/dL (ref 70–99)
POTASSIUM: 4.2 mmol/L (ref 3.5–5.1)
Sodium: 141 mmol/L (ref 135–145)
TOTAL PROTEIN: 6.8 g/dL (ref 6.5–8.1)

## 2018-09-23 LAB — LIPASE, BLOOD: LIPASE: 27 U/L (ref 11–51)

## 2018-09-23 MED ORDER — DICYCLOMINE HCL 10 MG PO CAPS
10.0000 mg | ORAL_CAPSULE | Freq: Once | ORAL | Status: AC
  Administered 2018-09-23: 10 mg via ORAL
  Filled 2018-09-23: qty 1

## 2018-09-23 MED ORDER — HYDROCORTISONE ACETATE 25 MG RE SUPP: 25.0000 mg | Freq: Two times a day (BID) | RECTAL | 0 refills | Status: DC

## 2018-09-23 MED ORDER — DICYCLOMINE HCL 20 MG PO TABS: 20.0000 mg | ORAL_TABLET | Freq: Two times a day (BID) | ORAL | 0 refills | Status: DC

## 2018-09-23 MED ORDER — IOHEXOL 300 MG/ML  SOLN
100.0000 mL | Freq: Once | INTRAMUSCULAR | Status: AC | PRN
  Administered 2018-09-23: 100 mL via INTRAVENOUS

## 2018-09-23 MED ORDER — HYOSCYAMINE SULFATE 0.125 MG PO TABS
0.1250 mg | ORAL_TABLET | Freq: Once | ORAL | Status: DC
  Filled 2018-09-23: qty 1

## 2018-09-23 MED ORDER — ONDANSETRON HCL 4 MG PO TABS: 4.0000 mg | ORAL_TABLET | Freq: Three times a day (TID) | ORAL | 0 refills | Status: DC | PRN

## 2018-09-23 NOTE — ED Notes (Signed)
Patient transported to CT 

## 2018-09-23 NOTE — ED Provider Notes (Signed)
[place] EMERGENCY DEPARTMENT Provider Note   CSN: [id number] Arrival date & time: 09/23/18  1557     History   Chief Complaint No chief complaint on file.   HPI Paul Humphrey is a 33 y.o. male who presents emergency department with chief complaint of diarrhea and rectal bleeding.  She was seen at the urgent care and sent to the emergency department.  Patient states that he has had about 2 days of diarrhea with associated cramping and tenesmus worse on the right lower side.  He has a history of right lower quadrant abdominal pain and previous trauma to the lower abdomen including impalement through the rectum requiring colostomy and takedown with reanastomosis.  Patient stopped having any loose stools however noticed a little bit of blood and then a large amount of blood with bowel movement earlier today.  He had some intermittent sharp pains in the lower right quadrant.  Patient denies nausea or vomiting.  He has no fever or chills.  Patient had a rectal examination earlier which showed no melena or blood on examining finger however had a occult guaiac positive stool result.  HPI  Past Medical History:  Diagnosis Date  . Arthritis of big toe    left  . Bulging of cervical intervertebral disc   . Chronic ethmoidal sinusitis 12/2016  . Chronic maxillary sinusitis 12/2016  . Dental crowns present   . Deviated nasal septum 12/2016  . Family history of adverse reaction to anesthesia    pt's mother has hx. of being hard to wake up post-op  . Lip laceration 12/28/2016   no stitches required, per pt.  . 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 Sinus headache     Patient Active Problem List   Diagnosis Date Noted  . Pain in both hands 11/23/2016  . Great toe pain, left 11/23/2016  . GERD (gastroesophageal reflux disease) 03/18/2015  . Abdominal pain 01/07/2015  . Tingling 01/07/2015  . Suspected exposure to heavy metals 01/07/2015  . Vitamin D  deficiency 01/07/2015    Past Surgical History:  Procedure Laterality Date  . CHOLECYSTECTOMY  2016  . COLECTOMY  2013  . COLOSTOMY  2013  . COLOSTOMY REVERSAL  2014  . LEG SURGERY Left 08/12/2005   lateral compartment release  . NASAL SEPTOPLASTY W/ TURBINOPLASTY Bilateral 01/04/2017   Procedure: NASAL SEPTOPLASTY WITH TURBINATE REDUCTION;  Surgeon: Izora Gala, MD;  Location: [place];  Service: ENT;  Laterality: Bilateral;  . NASAL SINUS SURGERY Bilateral 01/04/2017   Procedure: ENDOSCOPIC SINUS SURGERY;  Surgeon: Izora Gala, MD;  Location: [place];  Service: ENT;  Laterality: Bilateral;  . SHOULDER SURGERY    . UMBILICAL HERNIA REPAIR  2016   x 2  . URETERAL STENT PLACEMENT  2014        Home Medications    Prior to Admission medications   Medication Sig Start Date End Date Taking? Authorizing Provider  B Complex Vitamins (VITAMIN B-COMPLEX PO) Take 1 tablet by mouth daily.   Yes [provider]  ibuprofen (ADVIL,MOTRIN) 200 MG tablet Take 200-400 mg by mouth every 6 (six) hours as needed for mild pain.   Yes [provider]  Multiple Vitamins-Minerals (MULTIVITAMIN ADULT PO) Take 1 tablet by mouth daily. Reported on 01/18/2016   Yes [provider]  fluticasone (FLONASE) 50 MCG/ACT nasal spray Place 2 sprays into both nostrils daily. Patient not taking: Reported on 09/23/2018 08/02/18   Jodi Mourning  Jasmine December, FNP    Family History Family History  Problem Relation Age of Onset  . Breast cancer Mother   . Arthritis Mother   . Anesthesia problems Mother        hard to wake up post-op  . Arthritis Father   . Diabetes Maternal Grandmother   . Hypertension Maternal Grandmother   . Cancer Maternal Grandfather   . Colon cancer Maternal Grandfather   . Diabetes Paternal Grandmother   . Diabetes Paternal Grandfather   . Diabetes Paternal Aunt   . Colon cancer Paternal Uncle     Social History Social History     Tobacco Use  . Smoking status: Never Smoker  . Smokeless tobacco: Never Used  Substance Use Topics  . Alcohol use: Yes    Alcohol/week: 0.0 standard drinks    Comment: socially  . Drug use: No     Allergies   Gadolinium derivatives; Lactose intolerance (gi); and Percocet [oxycodone-acetaminophen]   Review of Systems Review of Systems  Ten systems reviewed and are negative for acute change, except as noted in the HPI.   Physical Exam Updated Vital Signs BP 123/71   Pulse 90   Temp 98.8 F (37.1 C) (Oral)   Resp 18   SpO2 99%   Physical Exam Vitals signs and nursing note reviewed.  Constitutional:      General: He is not in acute distress.    Appearance: He is well-developed. He is not diaphoretic.  HENT:     Head: Normocephalic and atraumatic.  Eyes:     General: No scleral icterus.    Conjunctiva/sclera: Conjunctivae normal.  Neck:     Musculoskeletal: Normal range of motion and neck supple.  Cardiovascular:     Rate and Rhythm: Normal rate and regular rhythm.     Heart sounds: Normal heart sounds.  Pulmonary:     Effort: Pulmonary effort is normal. No respiratory distress.     Breath sounds: Normal breath sounds.  Abdominal:     Palpations: Abdomen is soft.     Tenderness: There is abdominal tenderness.    Skin:    General: Skin is warm and dry.  Neurological:     Mental Status: He is alert.  Psychiatric:        Behavior: Behavior normal.      ED Treatments / Results  Labs (all labs ordered are listed, but only abnormal results are displayed) Labs Reviewed  COMPREHENSIVE METABOLIC PANEL - Abnormal; Notable for the following components:      Result Value   Alkaline Phosphatase 31 (*)    All other components within normal limits  CBC WITH DIFFERENTIAL/PLATELET  LIPASE, BLOOD  URINALYSIS, ROUTINE W REFLEX MICROSCOPIC    EKG None  Radiology Ct Abdomen Pelvis W Contrast  Result Date: 09/23/2018 CLINICAL DATA:  Pt here for Gi bleed ,  states that he has seen some blood on toilet paper after having a bm , pt was positive for blood in stool at MD office, pt has hx of rectal trauma EXAM: CT ABDOMEN AND PELVIS WITH CONTRAST TECHNIQUE: Multidetector CT imaging of the abdomen and pelvis was performed using the standard protocol following bolus administration of intravenous contrast. CONTRAST:  144mL OMNIPAQUE IOHEXOL 300 MG/ML  SOLN COMPARISON:  06/07/2016 FINDINGS: Lower chest: Clear lung bases.  Heart normal in size. Hepatobiliary: No focal liver abnormality is seen. Status post cholecystectomy. No biliary dilatation. Pancreas: Unremarkable. No pancreatic ductal dilatation or surrounding inflammatory changes. Spleen: Normal in size without focal  abnormality. Adrenals/Urinary Tract: No adrenal masses. Kidneys normal in size, orientation and position with symmetric enhancement. No renal masses or stones. No hydronephrosis. Right ureter normal in course and in caliber. Left ureter normal in course. Distal ureter is dilated similar to the prior CT. This may be developmental. It is chronic. No ureteral stones. Bladder is unremarkable. Stomach/Bowel: Bowel anastomosis staple line along the low sigmoid colon stable from the prior study. Left colon is decompressed. No colonic wall thickening or inflammation. Stomach and small bowel are within normal limits. Normal appendix visualized. Vascular/Lymphatic: No significant vascular findings are present. No enlarged abdominal or pelvic lymph nodes. Reproductive: Unremarkable. Other: No abdominal wall hernia or abnormality. No abdominopelvic ascites. Musculoskeletal: No acute or significant osseous findings. IMPRESSION: 1. No acute findings within the abdomen or pelvis. No findings to account for rectal bleeding. 2. Stable appearance of the anastomosis staple line along the low sigmoid colon. No bowel obstruction or inflammation. Electronically Signed   By: Lajean Manes M.D.   On: 09/23/2018 19:42     Procedures Procedures (including critical care time)  Medications Ordered in ED Medications  iohexol (OMNIPAQUE) 300 MG/ML solution 100 mL (100 mLs Intravenous Contrast Given 09/23/18 1920)     Initial Impression / Assessment and Plan / ED Course  I have reviewed the triage vital signs and the nursing notes.  Pertinent labs & imaging results that were available during my care of the patient were reviewed by me and considered in my medical decision making (see chart for details).    33 year old male with a history of previous trauma to the colon presenting with several days of diarrhea followed by some rectal bleeding.  He has a normal hemoglobin.  No elevated white blood cell count or fever, tenderness in the right lower quadrant is chronic but worse today.  His CT scan is negative for any inflammatory or infectious abnormalities, no signs of infection.  Suspect likely of enteritis/colitis.  He has no active diarrhea in the past 24 hours.  I also suspect is likely some internal hemorrhoids as a cause of his bleeding.  He has had no active bleeding here in the emergency department.  Patient will be treated with Anusol HC suppositories/Bentyl and Zofran.  Patient is advised to follow closely with gastroenterology for further evaluation.   Final Clinical Impressions(s) / ED Diagnoses   Final diagnoses:  None    ED Discharge Orders    None       Margarita Mail, PA-C 09/23/18 2115    Mesner, Corene Cornea, MD 09/23/18 2336

## 2018-09-23 NOTE — ED Provider Notes (Signed)
[facility]    CSN: [id number] Arrival date & time: 09/23/18  1330     History   Chief Complaint Chief Complaint  Patient presents with  . Blood in Stool    HPI Paul Humphrey is a 33 y.o. male.   Two days ago he began having diarrhea and went about 3 times, but tends to have loose stools since his choly., except he had been having a mild discomfort on his RLQ. Then yesterday had sensation he had to go to have a BM, and was having pressure sensation on the posterior anal region and  had loose stool with some bright blood. Then today has also had a couple of loose stools but saw more blood on the tissue, and been having dull ache on LLQ and intermittent sharp pains on RLQ that in the past hour is gradually getting worse. Pain level right now 6/10. LLQ abd pain 4/10. Prior to this change in bowel habits he has had loose stoolss, but no blood or abdominal pain. He denies N/V, fever or chills.  He is concerned since he had colon perforation which happened at a construction site that perforated the colon entering the anal region. Had a colectomy with colostomy which later got reversed. But has not had any problems having BM's.     Past Medical History:  Diagnosis Date  . Arthritis of big toe    left  . Bulging of cervical intervertebral disc   . Chronic ethmoidal sinusitis 12/2016  . Chronic maxillary sinusitis 12/2016  . Dental crowns present   . Deviated nasal septum 12/2016  . Family history of adverse reaction to anesthesia    pt's mother has hx. of being hard to wake up post-op  . Lip laceration 12/28/2016   no stitches required, per pt.  . 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 Sinus headache     Patient Active Problem List   Diagnosis Date Noted  . Pain in both hands 11/23/2016  . Great toe pain, left 11/23/2016  . GERD (gastroesophageal reflux disease) 03/18/2015  . Abdominal pain 01/07/2015  . Tingling 01/07/2015  . Suspected exposure  to heavy metals 01/07/2015  . Vitamin D deficiency 01/07/2015    Past Surgical History:  Procedure Laterality Date  . CHOLECYSTECTOMY  2016  . COLECTOMY  2013  . COLOSTOMY  2013  . COLOSTOMY REVERSAL  2014  . LEG SURGERY Left 08/12/2005   lateral compartment release  . NASAL SEPTOPLASTY W/ TURBINOPLASTY Bilateral 01/04/2017   Procedure: NASAL SEPTOPLASTY WITH TURBINATE REDUCTION;  Surgeon: Izora Gala, MD;  Location: [facility];  Service: ENT;  Laterality: Bilateral;  . NASAL SINUS SURGERY Bilateral 01/04/2017   Procedure: ENDOSCOPIC SINUS SURGERY;  Surgeon: Izora Gala, MD;  Location: [facility];  Service: ENT;  Laterality: Bilateral;  . SHOULDER SURGERY    . UMBILICAL HERNIA REPAIR  2016   x 2  . URETERAL STENT PLACEMENT  2014       Home Medications    Prior to Admission medications   Medication Sig Start Date End Date Taking? Authorizing Provider  cholecalciferol (VITAMIN D) 1000 UNITS tablet Take 1,000 Units by mouth every other day.     [provider]  fluticasone (FLONASE) 50 MCG/ACT nasal spray Place 2 sprays into both nostrils daily. 08/02/18   Marrian Salvage, FNP  Multiple Vitamins-Minerals (MULTIVITAMIN ADULT PO) Take 1 tablet by mouth daily. Reported on 01/18/2016    [provider]  Family History Family History  Problem Relation Age of Onset  . Breast cancer Mother   . Arthritis Mother   . Anesthesia problems Mother        hard to wake up post-op  . Arthritis Father   . Diabetes Maternal Grandmother   . Hypertension Maternal Grandmother   . Diabetes Paternal Grandmother   . Diabetes Paternal Grandfather   . Diabetes Paternal Aunt     Social History Social History   Tobacco Use  . Smoking status: Never Smoker  . Smokeless tobacco: Never Used  Substance Use Topics  . Alcohol use: Yes    Alcohol/week: 0.0 standard drinks    Comment: socially  . Drug use: No     Allergies   Gadolinium  derivatives; Ivp dye [iodinated diagnostic agents]; Lactose intolerance (gi); and Percocet [oxycodone-acetaminophen]   Review of Systems Review of Systems  Constitutional: Negative for chills, diaphoresis, fatigue and fever.  HENT: Negative for congestion, postnasal drip and rhinorrhea.   Respiratory: Negative for cough and shortness of breath.   Cardiovascular: Negative for chest pain.  Gastrointestinal: Positive for abdominal pain, blood in stool, diarrhea and rectal pain. Negative for abdominal distention, constipation, nausea and vomiting.  Musculoskeletal: Negative for myalgias.  Skin: Negative for rash.  Neurological: Negative for dizziness.  Psychiatric/Behavioral: The patient is nervous/anxious.        Concerned about his symptoms.      Physical Exam Triage Vital Signs ED Triage Vitals [09/23/18 1445]  Enc Vitals Group     BP      Pulse      Resp      Temp      Temp src      SpO2      Weight      Height      Head Circumference      Peak Flow      Pain Score 5     Pain Loc      Pain Edu?      Excl. in [place]?    No data found.  Updated Vital Signs There were no vitals taken for this visit.  Visual Acuity Right Eye Distance:   Left Eye Distance:   Bilateral Distance:    Right Eye Near:   Left Eye Near:    Bilateral Near:     Physical Exam Vitals signs and nursing note reviewed.  Constitutional:      General: He is not in acute distress.    Appearance: He is not toxic-appearing.  HENT:     Head: Normocephalic.     Right Ear: External ear normal.     Left Ear: External ear normal.     Nose: Nose normal.  Eyes:     General: No scleral icterus.    Conjunctiva/sclera: Conjunctivae normal.  Neck:     Musculoskeletal: Neck supple.  Cardiovascular:     Rate and Rhythm: Normal rate and regular rhythm.     Heart sounds: No murmur.  Pulmonary:     Effort: Pulmonary effort is normal.     Breath sounds: Normal breath sounds.  Abdominal:     General: There  is no distension.     Palpations: Abdomen is soft. There is no mass.     Tenderness: There is abdominal tenderness. There is guarding and rebound.     Hernia: No hernia is present.     Comments: Has hypoactive BM. Has guarding movements of R lower quadrant, but he states he does not  have pain when I let go pressure from his abd.   Genitourinary:    Rectum: Normal. Guaiac result positive.     Comments: Has tenderness on posterior anal wall, but I dont feel any lumps or masses.  Skin:    General: Skin is warm and dry.  Neurological:     Mental Status: He is alert and oriented to person, place, and time.     Gait: Gait normal.  Psychiatric:        Mood and Affect: Mood normal.        Behavior: Behavior normal.        Thought Content: Thought content normal.        Judgment: Judgment normal.      UC Treatments / Results  Labs (all labs ordered are listed, but only abnormal results are displayed) Labs Reviewed - No data to display  EKG None  Radiology No results found.  Procedures Procedures   Medications Ordered in UC Medications - No data to display  Initial Impression / Assessment and Plan / UC Course  I have reviewed the triage vital signs and the nursing notes. He was sent to ER for further work up.   Final Clinical Impressions(s) / UC Diagnoses   Final diagnoses:  None   Discharge Instructions   None    ED Prescriptions    None     Controlled Substance Prescriptions [place] Controlled Substance Registry consulted?    Shelby Mattocks, PA-C 09/23/18 1606

## 2018-09-23 NOTE — ED Triage Notes (Signed)
Pt presents with blood in stool.  Pt had an injury accident a few years ago where he was impaled rectally and had a series of surgeries to correct and is worried about a new injury.

## 2018-09-23 NOTE — ED Triage Notes (Signed)
Pt here for Gi bleed , states that he has seen some blood on toilet paper after having a bm , pt was positive for blood in stool at MD office, pt has hx of rectal trauma

## 2018-09-23 NOTE — Discharge Instructions (Signed)

## 2018-09-23 NOTE — Discharge Instructions (Signed)
GO TO ER TO HAVE FURTHER WORK UP LIKE LABS AND POSSIBLY ABDOMINAL CAT SCAN.

## 2018-10-15 ENCOUNTER — Ambulatory Visit (INDEPENDENT_AMBULATORY_CARE_PROVIDER_SITE_OTHER): Payer: BLUE CROSS/BLUE SHIELD | Admitting: Family Medicine

## 2018-10-15 ENCOUNTER — Encounter: Payer: Self-pay | Admitting: Family Medicine

## 2018-10-15 ENCOUNTER — Other Ambulatory Visit: Payer: Self-pay

## 2018-10-15 VITALS — BP 124/82 | HR 94 | Temp 98.5°F | Resp 16 | Ht 74.0 in | Wt 265.8 lb

## 2018-10-15 DIAGNOSIS — R590 Localized enlarged lymph nodes: Secondary | ICD-10-CM | POA: Diagnosis not present

## 2018-10-15 DIAGNOSIS — Z113 Encounter for screening for infections with a predominantly sexual mode of transmission: Secondary | ICD-10-CM | POA: Diagnosis not present

## 2018-10-15 DIAGNOSIS — J029 Acute pharyngitis, unspecified: Secondary | ICD-10-CM | POA: Diagnosis not present

## 2018-10-15 DIAGNOSIS — J039 Acute tonsillitis, unspecified: Secondary | ICD-10-CM | POA: Diagnosis not present

## 2018-10-15 LAB — POCT RAPID STREP A (OFFICE): Rapid Strep A Screen: NEGATIVE

## 2018-10-15 MED ORDER — AMOXICILLIN 500 MG PO CAPS: 500.0000 mg | ORAL_CAPSULE | Freq: Three times a day (TID) | ORAL | 0 refills | Status: DC

## 2018-10-15 NOTE — Patient Instructions (Addendum)
Rapid strep test was negative in the office, but with your current symptoms I will treat for strep throat at this point.  We did check a throat culture as well as other STI testing and will let you know if any dose are positive.  If any worsening symptoms return for recheck.  See other information of sore throat care.   Return to the clinic or go to the nearest emergency room if any of your symptoms worsen or new symptoms occur.   Sore Throat A sore throat is pain, burning, irritation, or scratchiness in the throat. When you have a sore throat, you may feel pain or tenderness in your throat when you swallow or talk. Many things can cause a sore throat, including:  An infection.  Seasonal allergies.  Dryness in the air.  Irritants, such as smoke or pollution.  Radiation treatment to the area.  Gastroesophageal reflux disease (GERD).  A tumor. A sore throat is often the first sign of another sickness. It may happen with other symptoms, such as coughing, sneezing, fever, and swollen neck glands. Most sore throats go away without medical treatment. Follow these instructions at home:      Take over-the-counter medicines only as told by your health care provider. ? If your child has a sore throat, do not give your child aspirin because of the association with Reye syndrome.  Drink enough fluids to keep your urine pale yellow.  Rest as needed.  To help with pain, try: ? Sipping warm liquids, such as broth, herbal tea, or warm water. ? Eating or drinking cold or frozen liquids, such as frozen ice pops. ? Gargling with a salt-water mixture 3-4 times a day or as needed. To make a salt-water mixture, completely dissolve -1 tsp (3-6 g) of salt in 1 cup (237 mL) of warm water. ? Sucking on hard candy or throat lozenges. ? Putting a cool-mist humidifier in your bedroom at night to moisten the air. ? Sitting in the bathroom with the door closed for 5-10 minutes while you run hot water  in the shower.  Do not use any products that contain nicotine or tobacco, such as cigarettes, e-cigarettes, and chewing tobacco. If you need help quitting, ask your health care provider.  Wash your hands well and often with soap and water. If soap and water are not available, use hand sanitizer. Contact a health care provider if:  You have a fever for more than 2-3 days.  You have symptoms that last (are persistent) for more than 2-3 days.  Your throat does not get better within 7 days.  You have a fever and your symptoms suddenly get worse.  Your child who is 3 months to 23 years old has a temperature of 102.60F (39C) or higher. Get help right away if:  You have difficulty breathing.  You cannot swallow fluids, soft foods, or your saliva.  You have increased swelling in your throat or neck.  You have persistent nausea and vomiting. Summary  A sore throat is pain, burning, irritation, or scratchiness in the throat. Many things can cause a sore throat.  Take over-the-counter medicines only as told by your health care provider. Do not give your child aspirin.  Drink plenty of fluids, and rest as needed.  Contact a health care provider if your symptoms worsen or your sore throat does not get better within 7 days. This information is not intended to replace advice given to you by your health care provider. Make  sure you discuss any questions you have with your health care provider. Document Released: 09/17/2004 Document Revised: 01/10/2018 Document Reviewed: 01/10/2018 Elsevier Interactive Patient Education  [employer].   If you have lab work done today you will be contacted with your lab results within the next 2 weeks.  If you have not heard from [place] then please contact us. The fastest way to get your results is to register for My Chart.   IF you received an x-ray today, you will receive an invoice from [facility]. Please contact [facility] at  [phone] with questions or concerns regarding your invoice.   IF you received labwork today, you will receive an invoice from [facility]. Please contact [facility] at [phone] with questions or concerns regarding your invoice.   Our billing staff will not be able to assist you with questions regarding bills from these companies.  You will be contacted with the lab results as soon as they are available. The fastest way to get your results is to activate your My Chart account. Instructions are located on the last page of this paperwork. If you have not heard from [place] regarding the results in 2 weeks, please contact this office.

## 2018-10-15 NOTE — Progress Notes (Signed)
Subjective:    Patient ID: Paul Humphrey, male    DOB: [birth year], 33 y.o.   MRN: [id number]  HPI Paul Humphrey is a 33 y.o. male Presents today for: Chief Complaint  Patient presents with  . STD testing    per pt "after going down on a girl, I have been having sore throat x 3-4 days, started on the right side of throat and progressed across throat."   Current sexual partner past 2 weeks. No intercourse, but had oral sex on male partner 3 days ago in the morning. Noticed some sore throat few hours later. initially R side, then moved to left side next morning.  No fever. No cough, no PND or sinus congestion. Only sore throat. No rash. No intercourse since last STI testing and no intercourse with this partner. Has not received oral sex form partner. No known sick contacts.  [employer] manager.   No explained wt loss/night sweats.   Negative gonorrhea/chlamydia testing in 2017.  Nonreactive HIV antibody July 2019.   Patient Active Problem List   Diagnosis Date Noted  . Pain in both hands 11/23/2016  . Great toe pain, left 11/23/2016  . GERD (gastroesophageal reflux disease) 03/18/2015  . Abdominal pain 01/07/2015  . Tingling 01/07/2015  . Suspected exposure to heavy metals 01/07/2015  . Vitamin D deficiency 01/07/2015   Past Medical History:  Diagnosis Date  . Arthritis of big toe    left  . Bulging of cervical intervertebral disc   . Chronic ethmoidal sinusitis 12/2016  . Chronic maxillary sinusitis 12/2016  . Dental crowns present   . Deviated nasal septum 12/2016  . Family history of adverse reaction to anesthesia    pt's mother has hx. of being hard to wake up post-op  . Lip laceration 12/28/2016   no stitches required, per pt.  . 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 Sinus headache    Past Surgical History:  Procedure Laterality Date  . CHOLECYSTECTOMY  2016  . COLECTOMY  2013  . COLOSTOMY  2013  . COLOSTOMY  REVERSAL  2014  . LEG SURGERY Left 08/12/2005   lateral compartment release  . NASAL SEPTOPLASTY W/ TURBINOPLASTY Bilateral 01/04/2017   Procedure: NASAL SEPTOPLASTY WITH TURBINATE REDUCTION;  Surgeon: Izora Gala, MD;  Location: [facility];  Service: ENT;  Laterality: Bilateral;  . NASAL SINUS SURGERY Bilateral 01/04/2017   Procedure: ENDOSCOPIC SINUS SURGERY;  Surgeon: Izora Gala, MD;  Location: [facility];  Service: ENT;  Laterality: Bilateral;  . SHOULDER SURGERY    . UMBILICAL HERNIA REPAIR  2016   x 2  . URETERAL STENT PLACEMENT  2014   Allergies  Allergen Reactions  . Gadolinium Derivatives Nausea And Vomiting  . Lactose Intolerance (Gi) Other (See Comments)    GI UPSET  . Percocet [Oxycodone-Acetaminophen] Other (See Comments)    "SKIN CRAWLING" SENSATION   Prior to Admission medications   Medication Sig Start Date End Date Taking? Authorizing Provider  ibuprofen (ADVIL,MOTRIN) 200 MG tablet Take 200-400 mg by mouth every 6 (six) hours as needed for mild pain.   Yes [provider]  Multiple Vitamins-Minerals (MULTIVITAMIN ADULT PO) Take 1 tablet by mouth daily. Reported on 01/18/2016   Yes [provider]  B Complex Vitamins (VITAMIN B-COMPLEX PO) Take 1 tablet by mouth daily.    [provider]  dicyclomine (BENTYL) 20 MG tablet Take 1 tablet (20 mg total) by mouth 2 (two)  times daily. Patient not taking: Reported on 10/15/2018 09/23/18   Margarita Mail, PA-C  fluticasone [facility]) 50 MCG/ACT nasal spray Place 2 sprays into both nostrils daily. Patient not taking: Reported on 09/23/2018 08/02/18   Marrian Salvage, FNP  hydrocortisone (ANUSOL-HC) 25 MG suppository Place 1 suppository (25 mg total) rectally 2 (two) times daily. For 7 days Patient not taking: Reported on 10/15/2018 09/23/18   Margarita Mail, PA-C   Social History   Socioeconomic History  . Marital status: Single    Spouse name: Not on file  .  Number of children: Not on file  . Years of education: Not on file  . Highest education level: Not on file  Occupational History  . Not on file  Social Needs  . Financial resource strain: Not on file  . Food insecurity:    Worry: Not on file    Inability: Not on file  . Transportation needs:    Medical: Not on file    Non-medical: Not on file  Tobacco Use  . Smoking status: Never Smoker  . Smokeless tobacco: Never Used  Substance and Sexual Activity  . Alcohol use: Yes    Alcohol/week: 0.0 standard drinks    Comment: socially  . Drug use: No  . Sexual activity: Yes  Lifestyle  . Physical activity:    Days per week: Not on file    Minutes per session: Not on file  . Stress: Not on file  Relationships  . Social connections:    Talks on phone: Not on file    Gets together: Not on file    Attends religious service: Not on file    Active member of club or organization: Not on file    Attends meetings of clubs or organizations: Not on file    Relationship status: Not on file  . Intimate partner violence:    Fear of current or ex partner: Not on file    Emotionally abused: Not on file    Physically abused: Not on file    Forced sexual activity: Not on file  Other Topics Concern  . Not on file  Social History Narrative   Lives with parents in a 2 story home.     Has no children.    Works as a Pharmacist, hospital, looking for a job here.    Highest level of education:  MFA    Review of Systems Per HPI.     Objective:   Physical Exam Vitals signs reviewed.  Constitutional:      Appearance: He is well-developed.  HENT:     Head: Normocephalic and atraumatic.     Right Ear: Tympanic membrane, ear canal and external ear normal.     Left Ear: Tympanic membrane, ear canal and external ear normal.     Nose: No rhinorrhea.     Mouth/Throat:     Pharynx: Oropharyngeal exudate (small amt at tonsilar recess bilaterally. ) and posterior oropharyngeal erythema (posterior oropharynx. )  present.  Eyes:     Conjunctiva/sclera: Conjunctivae normal.     Pupils: Pupils are equal, round, and reactive to light.  Neck:     Musculoskeletal: Neck supple.  Cardiovascular:     Rate and Rhythm: Normal rate and regular rhythm.     Heart sounds: Normal heart sounds. No murmur.  Pulmonary:     Effort: Pulmonary effort is normal.     Breath sounds: Normal breath sounds. No wheezing, rhonchi or rales.  Abdominal:     Palpations:  Abdomen is soft.     Tenderness: There is no abdominal tenderness.  Lymphadenopathy:     Cervical: Cervical adenopathy (R Ac greater than left ttp , enlarged. ) present.  Skin:    General: Skin is warm and dry.     Findings: No rash.  Neurological:     Mental Status: He is alert and oriented to person, place, and time.  Psychiatric:        Behavior: Behavior normal.    Vitals:   10/15/18 1049  BP: 124/82  Pulse: 94  Resp: 16  Temp: 98.5 F (36.9 C)  SpO2: 97%   Results for orders placed or performed in visit on 10/15/18  POCT rapid strep A  Result Value Ref Range   Rapid Strep A Screen Negative Negative      Assessment & Plan:    Paul Humphrey is a 33 y.o. male Sore throat - Plan: GC/Chlamydia Probe Amp, amoxicillin (AMOXIL) 500 MG capsule  LAD (lymphadenopathy), cervical - Plan: GC/Chlamydia Probe Amp, amoxicillin (AMOXIL) 500 MG capsule  Exudative tonsillitis - Plan: Culture, Group A Strep, POCT rapid strep A, amoxicillin (AMOXIL) 500 MG capsule  Routine screening for STI (sexually transmitted infection) - Plan: GC/Chlamydia Probe Amp  Onset of sore throat approximately 3 to 4 hours after oral sexual contact as above.  Unlikely STI but will check for chlamydia and gonorrhea on swab.  Based on current exam with small amount of exudate bilaterally, and erythema, concerning for possible false negative strep testing.  -Start amoxicillin, check throat culture, check chlamydia/gonorrhea cultures as above.  -RTC precautions discussed if acute  worsening symptoms, symptomatic care discussed with handout.  Meds ordered this encounter  Medications  . amoxicillin (AMOXIL) 500 MG capsule    Sig: Take 1 capsule (500 mg total) by mouth 3 (three) times daily.    Dispense:  30 capsule    Refill:  0   Patient Instructions     Rapid strep test was negative in the office, but with your current symptoms I will treat for strep throat at this point.  We did check a throat culture as well as other STI testing and will let you know if any dose are positive.  If any worsening symptoms return for recheck.  See other information of sore throat care.   Return to the clinic or go to the nearest emergency room if any of your symptoms worsen or new symptoms occur.   Sore Throat A sore throat is pain, burning, irritation, or scratchiness in the throat. When you have a sore throat, you may feel pain or tenderness in your throat when you swallow or talk. Many things can cause a sore throat, including:  An infection.  Seasonal allergies.  Dryness in the air.  Irritants, such as smoke or pollution.  Radiation treatment to the area.  Gastroesophageal reflux disease (GERD).  A tumor. A sore throat is often the first sign of another sickness. It may happen with other symptoms, such as coughing, sneezing, fever, and swollen neck glands. Most sore throats go away without medical treatment. Follow these instructions at home:      Take over-the-counter medicines only as told by your health care provider. ? If your child has a sore throat, do not give your child aspirin because of the association with Reye syndrome.  Drink enough fluids to keep your urine pale yellow.  Rest as needed.  To help with pain, try: ? Sipping warm liquids, such as broth, herbal tea,  or warm water. ? Eating or drinking cold or frozen liquids, such as frozen ice pops. ? Gargling with a salt-water mixture 3-4 times a day or as needed. To make a salt-water mixture,  completely dissolve -1 tsp (3-6 g) of salt in 1 cup (237 mL) of warm water. ? Sucking on hard candy or throat lozenges. ? Putting a cool-mist humidifier in your bedroom at night to moisten the air. ? Sitting in the bathroom with the door closed for 5-10 minutes while you run hot water in the shower.  Do not use any products that contain nicotine or tobacco, such as cigarettes, e-cigarettes, and chewing tobacco. If you need help quitting, ask your health care provider.  Wash your hands well and often with soap and water. If soap and water are not available, use hand sanitizer. Contact a health care provider if:  You have a fever for more than 2-3 days.  You have symptoms that last (are persistent) for more than 2-3 days.  Your throat does not get better within 7 days.  You have a fever and your symptoms suddenly get worse.  Your child who is 3 months to 37 years old has a temperature of 102.13F (39C) or higher. Get help right away if:  You have difficulty breathing.  You cannot swallow fluids, soft foods, or your saliva.  You have increased swelling in your throat or neck.  You have persistent nausea and vomiting. Summary  A sore throat is pain, burning, irritation, or scratchiness in the throat. Many things can cause a sore throat.  Take over-the-counter medicines only as told by your health care provider. Do not give your child aspirin.  Drink plenty of fluids, and rest as needed.  Contact a health care provider if your symptoms worsen or your sore throat does not get better within 7 days. This information is not intended to replace advice given to you by your health care provider. Make sure you discuss any questions you have with your health care provider. Document Released: 09/17/2004 Document Revised: 01/10/2018 Document Reviewed: 01/10/2018 Elsevier Interactive Patient Education  [employer].   If you have lab work done today you will be contacted with your lab  results within the next 2 weeks.  If you have not heard from [place] then please contact us. The fastest way to get your results is to register for My Chart.   IF you received an x-ray today, you will receive an invoice from [facility]. Please contact [facility] at [phone] with questions or concerns regarding your invoice.   IF you received labwork today, you will receive an invoice from [facility]. Please contact [facility] at [phone] with questions or concerns regarding your invoice.   Our billing staff will not be able to assist you with questions regarding bills from these companies.  You will be contacted with the lab results as soon as they are available. The fastest way to get your results is to activate your My Chart account. Instructions are located on the last page of this paperwork. If you have not heard from [place] regarding the results in 2 weeks, please contact this office.      Signed,   [name], MD [facility].  10/15/18 2:41 PM

## 2018-10-17 LAB — CULTURE, GROUP A STREP: Strep A Culture: NEGATIVE

## 2018-10-18 LAB — GC/CHLAMYDIA PROBE AMP
Chlamydia trachomatis, NAA: NEGATIVE
Neisseria gonorrhoeae by PCR: NEGATIVE

## 2018-10-19 ENCOUNTER — Encounter: Payer: Self-pay | Admitting: Family Medicine

## 2018-11-04 DIAGNOSIS — Z114 Encounter for screening for human immunodeficiency virus [HIV]: Secondary | ICD-10-CM | POA: Diagnosis not present

## 2018-11-04 DIAGNOSIS — Z113 Encounter for screening for infections with a predominantly sexual mode of transmission: Secondary | ICD-10-CM | POA: Diagnosis not present

## 2018-11-19 ENCOUNTER — Telehealth: Payer: Self-pay | Admitting: Family Medicine

## 2019-01-20 ENCOUNTER — Encounter: Payer: Self-pay | Admitting: Family Medicine

## 2019-04-27 ENCOUNTER — Ambulatory Visit: Payer: BC Managed Care – PPO | Admitting: Family Medicine

## 2019-04-27 ENCOUNTER — Other Ambulatory Visit: Payer: Self-pay

## 2019-04-27 ENCOUNTER — Other Ambulatory Visit (HOSPITAL_COMMUNITY)
Admission: RE | Admit: 2019-04-27 | Discharge: 2019-04-27 | Disposition: A | Discharge status: Home / Self Care | Payer: BLUE CROSS/BLUE SHIELD | Source: Ambulatory Visit | Attending: Family Medicine | Admitting: Family Medicine

## 2019-04-27 ENCOUNTER — Encounter: Payer: Self-pay | Admitting: Family Medicine

## 2019-04-27 VITALS — BP 117/80 | HR 90 | Temp 98.8°F | Resp 16 | Wt 254.4 lb

## 2019-04-27 DIAGNOSIS — J029 Acute pharyngitis, unspecified: Secondary | ICD-10-CM | POA: Insufficient documentation

## 2019-04-27 DIAGNOSIS — Z113 Encounter for screening for infections with a predominantly sexual mode of transmission: Secondary | ICD-10-CM | POA: Diagnosis not present

## 2019-04-27 DIAGNOSIS — J358 Other chronic diseases of tonsils and adenoids: Secondary | ICD-10-CM | POA: Diagnosis not present

## 2019-04-27 LAB — POCT RAPID STREP A (OFFICE): Rapid Strep A Screen: NEGATIVE

## 2019-04-27 NOTE — Patient Instructions (Addendum)
I do not see any tonsillolith or pus on tonsils today and the lymph nodes do not feel swollen.  Based on exam today I think we can hold off on antibiotics until further testing results.   See information below on sore throat care. Tylenol ok, cepacol or other lozenges ok.   I will refer you to ear nose and throat to discuss the increased tonsillitis.  Please let me know if there are questions and thank you for coming in today.  Return to the clinic or go to the nearest emergency room if any of your symptoms worsen or new symptoms occur.  Sore Throat A sore throat is pain, burning, irritation, or scratchiness in the throat. When you have a sore throat, you may feel pain or tenderness in your throat when you swallow or talk. Many things can cause a sore throat, including:  An infection.  Seasonal allergies.  Dryness in the air.  Irritants, such as smoke or pollution.  Radiation treatment to the area.  Gastroesophageal reflux disease (GERD).  A tumor. A sore throat is often the first sign of another sickness. It may happen with other symptoms, such as coughing, sneezing, fever, and swollen neck glands. Most sore throats go away without medical treatment. Follow these instructions at home:      Take over-the-counter medicines only as told by your health care provider. ? If your child has a sore throat, do not give your child aspirin because of the association with Reye syndrome.  Drink enough fluids to keep your urine pale yellow.  Rest as needed.  To help with pain, try: ? Sipping warm liquids, such as broth, herbal tea, or warm water. ? Eating or drinking cold or frozen liquids, such as frozen ice pops. ? Gargling with a salt-water mixture 3-4 times a day or as needed. To make a salt-water mixture, completely dissolve -1 tsp (3-6 g) of salt in 1 cup (237 mL) of warm water. ? Sucking on hard candy or throat lozenges. ? Putting a cool-mist humidifier in your bedroom at night  to moisten the air. ? Sitting in the bathroom with the door closed for 5-10 minutes while you run hot water in the shower.  Do not use any products that contain nicotine or tobacco, such as cigarettes, e-cigarettes, and chewing tobacco. If you need help quitting, ask your health care provider.  Wash your hands well and often with soap and water. If soap and water are not available, use hand sanitizer. Contact a health care provider if:  You have a fever for more than 2-3 days.  You have symptoms that last (are persistent) for more than 2-3 days.  Your throat does not get better within 7 days.  You have a fever and your symptoms suddenly get worse.  Your child who is 3 months to 68 years old has a temperature of 102.38F (39C) or higher. Get help right away if:  You have difficulty breathing.  You cannot swallow fluids, soft foods, or your saliva.  You have increased swelling in your throat or neck.  You have persistent nausea and vomiting. Summary  A sore throat is pain, burning, irritation, or scratchiness in the throat. Many things can cause a sore throat.  Take over-the-counter medicines only as told by your health care provider. Do not give your child aspirin.  Drink plenty of fluids, and rest as needed.  Contact a health care provider if your symptoms worsen or your sore throat does not get better  within 7 days. This information is not intended to replace advice given to you by your health care provider. Make sure you discuss any questions you have with your health care provider. Document Released: 09/17/2004 Document Revised: 01/10/2018 Document Reviewed: 01/10/2018 Elsevier Patient Education  [employer].    If you have lab work done today you will be contacted with your lab results within the next 2 weeks.  If you have not heard from [place] then please contact us. The fastest way to get your results is to register for My Chart.   IF you received an x-ray today,  you will receive an invoice from [facility]. Please contact [facility] at [phone] with questions or concerns regarding your invoice.   IF you received labwork today, you will receive an invoice from [place] [place]. Please contact [facility] at [phone] with questions or concerns regarding your invoice.   Our billing staff will not be able to assist you with questions regarding bills from these companies.  You will be contacted with the lab results as soon as they are available. The fastest way to get your results is to activate your My Chart account. Instructions are located on the last page of this paperwork. If you have not heard from [place] regarding the results in 2 weeks, please contact this office.

## 2019-04-27 NOTE — Progress Notes (Signed)
Subjective:    Patient ID: Paul Humphrey, male    DOB: 07/14/1986, 33 y.o.   MRN: [id number]  HPI Paul Humphrey is a 33 y.o. male Presents today for: Chief Complaint  Patient presents with  . Sore Throat    Patient was last seen in feb  for the same symptoms. started off as the left tonsil swelling  sat 04/22/19 but hsve been having tonsil stones for about 2 weeks before tonsil start swelling.   Sore throat:  Left tonsil swelling noted approximately 04/22/2019- about 6 days ago.  Noticed after performing oral sex on new partner, but both he and his partner had negative STI testing a month ago. Monogamous.   Tonsil stones noted about 2 weeks prior  Evaluated for sore throat in February including sexual transmitted infections which was negative/normal. Sore throat for few months after that visit, [facility] including of throat and blood work was negative/normal. Concern for possible tonsilitis - no further follow up. Sore throat better by mid April.  Back since Saturday.  More tonsil stones seem to be present - every few months prior, then in past few months every week, then daily past 2 weeks.  No fever. Measuring temp daily.  No PND.  Last saw ENT for sinus issue about a year ago. Less sinus issues since surgery - Dr. Constance Holster.   Tx: none.   No covid exposure known.  Teaching at [employer].  No change in taste/smell, no cough. No new body aches, or headache.  No dyspnea.  Did want covid testing today.   Patient Active Problem List   Diagnosis Date Noted  . Pain in both hands 11/23/2016  . Great toe pain, left 11/23/2016  . GERD (gastroesophageal reflux disease) 03/18/2015  . Abdominal pain 01/07/2015  . Tingling 01/07/2015  . Suspected exposure to heavy metals 01/07/2015  . Vitamin D deficiency 01/07/2015   Past Medical History:  Diagnosis Date  . Arthritis of big toe    left  . Bulging of cervical intervertebral disc   . Chronic  ethmoidal sinusitis 12/2016  . Chronic maxillary sinusitis 12/2016  . Dental crowns present   . Deviated nasal septum 12/2016  . Family history of adverse reaction to anesthesia    pt's mother has hx. of being hard to wake up post-op  . Lip laceration 12/28/2016   no stitches required, per pt.  . 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 Sinus headache    Past Surgical History:  Procedure Laterality Date  . CHOLECYSTECTOMY  2016  . COLECTOMY  2013  . COLOSTOMY  2013  . COLOSTOMY REVERSAL  2014  . LEG SURGERY Left 08/12/2005   lateral compartment release  . NASAL SEPTOPLASTY W/ TURBINOPLASTY Bilateral 01/04/2017   Procedure: NASAL SEPTOPLASTY WITH TURBINATE REDUCTION;  Surgeon: Izora Gala, MD;  Location: [facility];  Service: ENT;  Laterality: Bilateral;  . NASAL SINUS SURGERY Bilateral 01/04/2017   Procedure: ENDOSCOPIC SINUS SURGERY;  Surgeon: Izora Gala, MD;  Location: [facility];  Service: ENT;  Laterality: Bilateral;  . SHOULDER SURGERY    . UMBILICAL HERNIA REPAIR  2016   x 2  . URETERAL STENT PLACEMENT  2014   Allergies  Allergen Reactions  . Gadolinium Derivatives Nausea And Vomiting  . Lactose Intolerance (Gi) Other (See Comments)    GI UPSET  . Percocet [Oxycodone-Acetaminophen] Other (See Comments)    "SKIN CRAWLING" SENSATION  Prior to Admission medications   Medication Sig Start Date End Date Taking? Authorizing Provider  Multiple Vitamins-Minerals (MULTIVITAMIN ADULT PO) Take 1 tablet by mouth daily. Reported on 01/18/2016   Yes [provider]   Social History   Socioeconomic History  . Marital status: Single    Spouse name: Not on file  . Number of children: Not on file  . Years of education: Not on file  . Highest education level: Not on file  Occupational History  . Not on file  Social Needs  . Financial resource strain: Not on file  . Food insecurity    Worry: Not on file    Inability:  Not on file  . Transportation needs    Medical: Not on file    Non-medical: Not on file  Tobacco Use  . Smoking status: Never Smoker  . Smokeless tobacco: Never Used  Substance and Sexual Activity  . Alcohol use: Yes    Alcohol/week: 0.0 standard drinks    Comment: socially  . Drug use: No  . Sexual activity: Yes  Lifestyle  . Physical activity    Days per week: Not on file    Minutes per session: Not on file  . Stress: Not on file  Relationships  . Social Herbalist on phone: Not on file    Gets together: Not on file    Attends religious service: Not on file    Active member of club or organization: Not on file    Attends meetings of clubs or organizations: Not on file    Relationship status: Not on file  . Intimate partner violence    Fear of current or ex partner: Not on file    Emotionally abused: Not on file    Physically abused: Not on file    Forced sexual activity: Not on file  Other Topics Concern  . Not on file  Social History Narrative   Lives with parents in a 2 story home.     Has no children.    Works as a Pharmacist, hospital, looking for a job here.    Highest level of education:  MFA    Review of Systems Per HPI    Objective:   Physical Exam Vitals signs reviewed.  Constitutional:      Appearance: He is well-developed.  HENT:     Head: Normocephalic and atraumatic.     Right Ear: Tympanic membrane, ear canal and external ear normal.     Left Ear: Tympanic membrane, ear canal and external ear normal.     Nose: No rhinorrhea.     Mouth/Throat:     Pharynx: Posterior oropharyngeal erythema (Slight erythema posterior oropharynx, no exudate at tonsils, no tonsilloliths visualized.  No apparent tonsillar hypertrophy) present. No oropharyngeal exudate.  Eyes:     Conjunctiva/sclera: Conjunctivae normal.     Pupils: Pupils are equal, round, and reactive to light.  Neck:     Musculoskeletal: Neck supple.  Cardiovascular:     Rate and Rhythm: Normal  rate and regular rhythm.     Heart sounds: Normal heart sounds. No murmur.  Pulmonary:     Effort: Pulmonary effort is normal.     Breath sounds: Normal breath sounds. No wheezing, rhonchi or rales.  Abdominal:     Palpations: Abdomen is soft.     Tenderness: There is no abdominal tenderness.  Lymphadenopathy:     Cervical: No cervical adenopathy.  Skin:    General: Skin is warm  and dry.     Findings: No rash.  Neurological:     Mental Status: He is alert and oriented to person, place, and time.  Psychiatric:        Behavior: Behavior normal.    Vitals:   04/27/19 1559  BP: 117/80  Pulse: 90  Resp: 16  Temp: 98.8 F (37.1 C)  TempSrc: Oral  SpO2: 97%  Weight: 254 lb 6.4 oz (115.4 kg)   Results for orders placed or performed in visit on 04/27/19  POCT rapid strep A  Result Value Ref Range   Rapid Strep A Screen Negative Negative       Assessment & Plan:   Paul Humphrey is a 33 y.o. male Sore throat - Plan: Novel Coronavirus, NAA ([facility]), Culture, Group A Strep, GC/Chlamydia probe amp ([place])not at [facility], POCT rapid strep A, Ambulatory referral to ENT  Routine screening for STI (sexually transmitted infection) - Plan: GC/Chlamydia probe amp ([place])not at [facility]: Ambulatory referral to ENT  Slight posterior erythema without exudate or signs of exudative tonsillitis.  No lymphadenopathy, no fever.  -  Concern for potential exposure to sexually transmitted infection-GC/CT swab of throat obtained  -Rapid strep testing negative, check throat culture.  -Symptomatic care with RTC precautions discussed.  -ENT referral for recurrent tonsilloliths.  No orders of the defined types were placed in this encounter.  Patient Instructions    I do not see any tonsillolith or pus on tonsils today and the lymph nodes do not feel swollen.  Based on exam today I think we can hold off on antibiotics until further testing results.   See information below on  sore throat care. Tylenol ok, cepacol or other lozenges ok.   I will refer you to ear nose and throat to discuss the increased tonsillitis.  Please let me know if there are questions and thank you for coming in today.  Return to the clinic or go to the nearest emergency room if any of your symptoms worsen or new symptoms occur.  Sore Throat A sore throat is pain, burning, irritation, or scratchiness in the throat. When you have a sore throat, you may feel pain or tenderness in your throat when you swallow or talk. Many things can cause a sore throat, including:  An infection.  Seasonal allergies.  Dryness in the air.  Irritants, such as smoke or pollution.  Radiation treatment to the area.  Gastroesophageal reflux disease (GERD).  A tumor. A sore throat is often the first sign of another sickness. It may happen with other symptoms, such as coughing, sneezing, fever, and swollen neck glands. Most sore throats go away without medical treatment. Follow these instructions at home:      Take over-the-counter medicines only as told by your health care provider. ? If your child has a sore throat, do not give your child aspirin because of the association with Reye syndrome.  Drink enough fluids to keep your urine pale yellow.  Rest as needed.  To help with pain, try: ? Sipping warm liquids, such as broth, herbal tea, or warm water. ? Eating or drinking cold or frozen liquids, such as frozen ice pops. ? Gargling with a salt-water mixture 3-4 times a day or as needed. To make a salt-water mixture, completely dissolve -1 tsp (3-6 g) of salt in 1 cup (237 mL) of warm water. ? Sucking on hard candy or throat lozenges. ? Putting a cool-mist humidifier in your bedroom at  night to moisten the air. ? Sitting in the bathroom with the door closed for 5-10 minutes while you run hot water in the shower.  Do not use any products that contain nicotine or tobacco, such as cigarettes,  e-cigarettes, and chewing tobacco. If you need help quitting, ask your health care provider.  Wash your hands well and often with soap and water. If soap and water are not available, use hand sanitizer. Contact a health care provider if:  You have a fever for more than 2-3 days.  You have symptoms that last (are persistent) for more than 2-3 days.  Your throat does not get better within 7 days.  You have a fever and your symptoms suddenly get worse.  Your child who is 3 months to 62 years old has a temperature of 102.50F (39C) or higher. Get help right away if:  You have difficulty breathing.  You cannot swallow fluids, soft foods, or your saliva.  You have increased swelling in your throat or neck.  You have persistent nausea and vomiting. Summary  A sore throat is pain, burning, irritation, or scratchiness in the throat. Many things can cause a sore throat.  Take over-the-counter medicines only as told by your health care provider. Do not give your child aspirin.  Drink plenty of fluids, and rest as needed.  Contact a health care provider if your symptoms worsen or your sore throat does not get better within 7 days. This information is not intended to replace advice given to you by your health care provider. Make sure you discuss any questions you have with your health care provider. Document Released: 09/17/2004 Document Revised: 01/10/2018 Document Reviewed: 01/10/2018 Elsevier Patient Education  [employer].    If you have lab work done today you will be contacted with your lab results within the next 2 weeks.  If you have not heard from [place] then please contact us. The fastest way to get your results is to register for My Chart.   IF you received an x-ray today, you will receive an invoice from [facility]. Please contact [facility] at [phone] with questions or concerns regarding your invoice.   IF you received labwork today, you will  receive an invoice from [facility]. Please contact [facility] at [phone] with questions or concerns regarding your invoice.   Our billing staff will not be able to assist you with questions regarding bills from these companies.  You will be contacted with the lab results as soon as they are available. The fastest way to get your results is to activate your My Chart account. Instructions are located on the last page of this paperwork. If you have not heard from [place] regarding the results in 2 weeks, please contact this office.       Signed,   [name], MD [facility].  04/27/19 8:01 PM

## 2019-04-28 LAB — NOVEL CORONAVIRUS, NAA: SARS-CoV-2, NAA: NOT DETECTED

## 2019-04-29 LAB — GC/CHLAMYDIA PROBE AMP (~~LOC~~) NOT AT ARMC
Chlamydia: NEGATIVE
Neisseria Gonorrhea: NEGATIVE

## 2019-04-29 LAB — CULTURE, GROUP A STREP: Strep A Culture: NEGATIVE

## 2019-05-23 ENCOUNTER — Encounter: Payer: Self-pay | Admitting: Family Medicine

## 2019-05-23 ENCOUNTER — Ambulatory Visit (INDEPENDENT_AMBULATORY_CARE_PROVIDER_SITE_OTHER): Payer: BC Managed Care – PPO | Admitting: Family Medicine

## 2019-05-23 ENCOUNTER — Ambulatory Visit: Payer: Self-pay

## 2019-05-23 VITALS — BP 112/75 | HR 77 | Temp 98.9°F | Resp 16 | Ht 73.0 in | Wt 253.0 lb

## 2019-05-23 DIAGNOSIS — Z0000 Encounter for general adult medical examination without abnormal findings: Secondary | ICD-10-CM

## 2019-05-23 DIAGNOSIS — E559 Vitamin D deficiency, unspecified: Secondary | ICD-10-CM | POA: Diagnosis not present

## 2019-05-23 DIAGNOSIS — K219 Gastro-esophageal reflux disease without esophagitis: Secondary | ICD-10-CM | POA: Diagnosis not present

## 2019-05-23 DIAGNOSIS — M25552 Pain in left hip: Secondary | ICD-10-CM

## 2019-05-23 DIAGNOSIS — R0789 Other chest pain: Secondary | ICD-10-CM

## 2019-05-23 DIAGNOSIS — M25551 Pain in right hip: Secondary | ICD-10-CM

## 2019-05-23 DIAGNOSIS — E781 Pure hyperglyceridemia: Secondary | ICD-10-CM

## 2019-05-23 DIAGNOSIS — E6609 Other obesity due to excess calories: Secondary | ICD-10-CM | POA: Diagnosis not present

## 2019-05-23 DIAGNOSIS — Z6833 Body mass index (BMI) 33.0-33.9, adult: Secondary | ICD-10-CM

## 2019-05-23 NOTE — Progress Notes (Signed)
Office Visit Note   Patient: Paul Humphrey           Date of Birth: 06/22/1986           MRN: [id number] Visit Date: 05/23/2019 Requested by: Wendie Agreste, MD [address] [place],  [place] [address] PCP: Wendie Agreste, MD  Subjective: Chief Complaint  Patient presents with  . Annual Exam  . establish primary care    HPI: He is here for a wellness exam.  He wanted to come here because he has some musculoskeletal issues.  He has a fairly eventful medical history.  He brought a with personal and family history details and this will be put in his chart.  1 of his current concerns is that bothered him intermittently.  He gets stiffness that lasts for a couple days.  He has a family history of hip arthritis requiring hip replacement in multiple members.  1 of his uncles had both hips replaced at age 83.  He also gets occasional tightness in the chest muscles.  He does have a history of vitamin D deficiency a few years ago which was treated, he has not been taking anything regularly lately.  He has noticed a skin lesion on his right arm just above the elbow for the past couple weeks.  It was itchy for couple days but now it is not.  It is not going away very quickly.                ROS: Denies fevers or chills.  Denies bowel or bladder troubles.  All other systems were reviewed and are negative.  Objective: Vital Signs: BP 112/75 (BP Location: Left Arm, Patient Position: Sitting, Cuff Size: Large)   Pulse 77   Temp 98.9 F (37.2 C)   Resp 16   Ht 6\' 1"  (1.854 m)   Wt 253 lb (114.8 kg)   BMI 33.38 kg/m   Physical Exam:  General:  Alert and oriented, in no acute distress. Pulm:  Breathing unlabored. Psy:  Normal mood, congruent affect. Skin: There is a reddish lesion lateral right upper arm above the elbow measuring roughly half centimeter at its longest.  It is flat.  I took a Clinical research associate for his chart. HEENT:  [place]/AT, PERRLA, EOM Full, no nystagmus.  Funduscopic examination  within normal limits.  No conjunctival erythema.  Tympanic membranes are pearly gray with normal landmarks.  External ear canals are normal.  No significant lymphadenopathy.  No thyromegaly or nodules.  2+ carotid pulses without bruits. CV: Regular rate and rhythm without murmurs, rubs, or gallops.  No peripheral edema.  2+ radial and posterior tibial pulses. Lungs: Clear to auscultation throughout with no wheezing or areas of consolidation. Abd: Bowel sounds are active, no hepatosplenomegaly or masses.  Soft and nontender.  No audible bruits.  No evidence of ascites. Extremities: 2+ upper and lower DTRs.  No nail deformities. Musculoskeletal: He has stiffness in both hips with internal rotation limited to about 10 degrees.  Slight pain when doing this on both sides.    Imaging: X-rays both hips: He has a small cam deformity bilaterally, with ossification lateral to the right hip joint which could be a sign of a labrum tear.  Slight irregularity of acetabulum on both sides but overall good joint spacing.  Left L5-S1 facet arthritic changes present.   Assessment & Plan: 1.  Wellness examination -Labs to evaluate. -Recommended minimizing intake of processed carbohydrates and sweets.  2.  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 -Trial of glucosamine.  Avoidance of sugar.  3.  History of vitamin D deficiency -Recheck levels today.  We will maintain a level of 50-80.  4.  Right arm skin lesion, etiology uncertain. -We will monitor until resolved.  Dermatology referral if it persist.    Procedures: No procedures performed  No notes on file     PMFS History: Patient Active Problem List   Diagnosis Date Noted  . Class 1 obesity due to excess calories without serious comorbidity with body mass index (BMI) of 33.0 to 33.9 in adult 05/23/2019  . Other specified postprocedural states 11/02/2017  . Impingement syndrome of shoulder region 09/14/2017  . Chronic ethmoidal sinusitis  11/24/2016  . Chronic maxillary sinusitis 11/24/2016  . Deviated septum 11/24/2016  . Pain in both hands 11/23/2016  . Great toe pain, left 11/23/2016  . GERD (gastroesophageal reflux disease) 03/18/2015  . Abdominal pain 01/07/2015  . Tingling 01/07/2015  . Suspected exposure to heavy metals 01/07/2015  . Vitamin D deficiency 01/07/2015  . Epigastric pain 10/04/2014   Past Medical History:  Diagnosis Date  . Arthritis of big toe    left  . Bulging of cervical intervertebral disc   . Chronic ethmoidal sinusitis 12/2016  . Chronic maxillary sinusitis 12/2016  . Dental crowns present   . Deviated nasal septum 12/2016  . Family history of adverse reaction to anesthesia    pt's mother has hx. of being hard to wake up post-op  . Lip laceration 12/28/2016   no stitches required, per pt.  . 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 Sinus headache     Family History  Problem Relation Age of Onset  . Breast cancer Mother   . Arthritis Mother   . Anesthesia problems Mother        hard to wake up post-op  . Arthritis Father   . Diabetes Maternal Grandmother   . Hypertension Maternal Grandmother   . Cancer Maternal Grandfather   . Colon cancer Maternal Grandfather   . Diabetes Paternal Grandmother   . Diabetes Paternal Grandfather   . Diabetes Paternal Aunt   . Colon cancer Paternal Uncle     Past Surgical History:  Procedure Laterality Date  . CHOLECYSTECTOMY  2016  . COLECTOMY  2013  . COLOSTOMY  2013  . COLOSTOMY REVERSAL  2014  . LEG SURGERY Left 08/12/2005   lateral compartment release  . NASAL SEPTOPLASTY W/ TURBINOPLASTY Bilateral 01/04/2017   Procedure: NASAL SEPTOPLASTY WITH TURBINATE REDUCTION;  Surgeon: Izora Gala, MD;  Location: [facility];  Service: ENT;  Laterality: Bilateral;  . NASAL SINUS SURGERY Bilateral 01/04/2017   Procedure: ENDOSCOPIC SINUS SURGERY;  Surgeon: Izora Gala, MD;  Location: [facility];   Service: ENT;  Laterality: Bilateral;  . SHOULDER SURGERY    . UMBILICAL HERNIA REPAIR  2016   x 2  . URETERAL STENT PLACEMENT  2014   Social History   Occupational History  . Not on file  Tobacco Use  . Smoking status: Never Smoker  . Smokeless tobacco: Never Used  Substance and Sexual Activity  . Alcohol use: Yes    Alcohol/week: 0.0 standard drinks    Comment: socially  . Drug use: No  . Sexual activity: Yes

## 2019-05-24 ENCOUNTER — Telehealth: Payer: Self-pay | Admitting: Family Medicine

## 2019-05-24 LAB — COMPREHENSIVE METABOLIC PANEL
AG Ratio: 1.6 (calc) (ref 1.0–2.5)
ALT: 35 U/L (ref 9–46)
AST: 26 U/L (ref 10–40)
Albumin: 4.4 g/dL (ref 3.6–5.1)
Alkaline phosphatase (APISO): 37 U/L (ref 36–130)
BUN: 12 mg/dL (ref 7–25)
CO2: 23 mmol/L (ref 20–32)
Calcium: 9.4 mg/dL (ref 8.6–10.3)
Chloride: 106 mmol/L (ref 98–110)
Creat: 1.03 mg/dL (ref 0.60–1.35)
Globulin: 2.7 g/dL (calc) (ref 1.9–3.7)
Glucose, Bld: 86 mg/dL (ref 65–99)
Potassium: 4.3 mmol/L (ref 3.5–5.3)
Sodium: 139 mmol/L (ref 135–146)
Total Bilirubin: 0.8 mg/dL (ref 0.2–1.2)
Total Protein: 7.1 g/dL (ref 6.1–8.1)

## 2019-05-24 LAB — CBC WITH DIFFERENTIAL/PLATELET
Absolute Monocytes: 383 cells/uL (ref 200–950)
Basophils Absolute: 32 cells/uL (ref 0–200)
Basophils Relative: 0.6 %
Eosinophils Absolute: 92 cells/uL (ref 15–500)
Eosinophils Relative: 1.7 %
HCT: 49.5 % (ref 38.5–50.0)
Hemoglobin: 16.5 g/dL (ref 13.2–17.1)
Lymphs Abs: 1075 cells/uL (ref 850–3900)
MCH: 29.8 pg (ref 27.0–33.0)
MCHC: 33.3 g/dL (ref 32.0–36.0)
MCV: 89.5 fL (ref 80.0–100.0)
MPV: 11.1 fL (ref 7.5–12.5)
Monocytes Relative: 7.1 %
Neutro Abs: 3818 cells/uL (ref 1500–7800)
Neutrophils Relative %: 70.7 %
Platelets: 175 10*3/uL (ref 140–400)
RBC: 5.53 10*6/uL (ref 4.20–5.80)
RDW: 13 % (ref 11.0–15.0)
Total Lymphocyte: 19.9 %
WBC: 5.4 10*3/uL (ref 3.8–10.8)

## 2019-05-24 LAB — LIPID PANEL
Cholesterol: 132 mg/dL (ref <200)
HDL: 31 mg/dL — ABNORMAL LOW (ref >=40)
LDL Cholesterol (Calc): 75 mg/dL (calc)
Non-HDL Cholesterol (Calc): 101 mg/dL (calc) (ref <130)
Total CHOL/HDL Ratio: 4.3 (calc) (ref <5.0)
Triglycerides: 160 mg/dL — ABNORMAL HIGH (ref <150)

## 2019-05-24 LAB — HIGH SENSITIVITY CRP: hs-CRP: 0.6 mg/L

## 2019-05-24 LAB — VITAMIN D 25 HYDROXY (VIT D DEFICIENCY, FRACTURES): Vit D, 25-Hydroxy: 33 ng/mL (ref 30–100)

## 2019-05-24 NOTE — Telephone Encounter (Signed)
Labs are notable for the following:  Vitamin D is normal at 33, but ideal range is 50-80.  I recommend taking over-the-counter vitamin D3 at 2000 IU daily long-term for maintenance.  Lipid panel shows slightly elevated triglycerides and slightly low HDL.  In general these can be improved with regular exercise and with dietary limitation of processed carbohydrates including breads, pastas, cereals, sugars and sweets.  I would suggest rechecking this in 1 to 2 years.  Everything else looks great.

## 2019-08-15 ENCOUNTER — Encounter: Payer: Self-pay | Admitting: Family Medicine

## 2019-08-15 ENCOUNTER — Ambulatory Visit: Payer: BC Managed Care – PPO | Admitting: Family Medicine

## 2019-08-15 ENCOUNTER — Other Ambulatory Visit: Payer: Self-pay

## 2019-08-15 ENCOUNTER — Ambulatory Visit: Payer: Self-pay

## 2019-08-15 DIAGNOSIS — M545 Low back pain, unspecified: Secondary | ICD-10-CM

## 2019-08-15 MED ORDER — HYDROCODONE-ACETAMINOPHEN 5-325 MG PO TABS: 1.0000 | ORAL_TABLET | Freq: Four times a day (QID) | ORAL | 0 refills | Status: DC | PRN

## 2019-08-15 MED ORDER — BACLOFEN 10 MG PO TABS: 5.0000 mg | ORAL_TABLET | Freq: Every evening | ORAL | 3 refills | Status: DC | PRN

## 2019-08-15 NOTE — Progress Notes (Signed)
Office Visit Note   Patient: Paul Humphrey           Date of Birth: 1985-11-19           MRN: [id number] Visit Date: 08/15/2019 Requested by: Paul Blase, MD [address] [place],  [place] [address] PCP: Paul Blase, MD  Subjective: Chief Complaint  Patient presents with  . Lower Back - Pain    Pain since yesterday a.m. Been doing a lot of power washing, painting, etc over the last 1&1/2 weeks. Back "feels twisted." Happened before in 2017 (saw Dr. Alfonso Humphrey for this).     HPI: He is here with low back pain.  Yesterday morning he woke up with severe pain.  He has been doing power washing and other physically demanding activities for the past week and a half.  In the past this happened before only more severely.  He ended up being diagnosed with a disc herniation.  Physical therapy was making his pain worse so eventually he had an epidural injection which made his symptoms resolved.  This was in approximately 2017.  He has been doing well until now.  Pain radiates into both legs.  No bowel or bladder dysfunction.              ROS: No fevers or chills.  All other systems were reviewed and are negative.  Objective: Vital Signs: There were no vitals taken for this visit.  Physical Exam:  General:  Alert and oriented, in no acute distress. Pulm:  Breathing unlabored. Psy:  Normal mood, congruent affect. Skin: No rash. Low back: He is not significantly tender to palpation but his pain is in the L4-5/L5-S1 area.  No pain in the sciatic notch or over the SI joints.  Negative straight leg raise, lower extremity strength and reflexes are still normal.  Imaging: X-rays lumbar spine: Slight narrowing of the L4-5 and L5-S1 disc spaces.  Anterior wedge deformity of T12 vertebra.  This is unchanged compared to CT images from last winter.  No acute abnormalities.  Assessment & Plan: 1.  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   Home extension exercises.  Referral for lumbar epidural injection.  Follow-up as needed.     Procedures: No procedures performed  No notes on file     PMFS History: Patient Active Problem List   Diagnosis Date Noted  . Class 1 obesity due to excess calories without serious comorbidity with body mass index (BMI) of 33.0 to 33.9 in adult 05/23/2019  . Other specified postprocedural states 11/02/2017  . Impingement syndrome of shoulder region 09/14/2017  . Chronic ethmoidal sinusitis 11/24/2016  . Chronic maxillary sinusitis 11/24/2016  . Deviated septum 11/24/2016  . Pain in both hands 11/23/2016  . Great toe pain, left 11/23/2016  . GERD (gastroesophageal reflux disease) 03/18/2015  . Abdominal pain 01/07/2015  . Tingling 01/07/2015  . Suspected exposure to heavy metals 01/07/2015  . Vitamin D deficiency 01/07/2015  . Epigastric pain 10/04/2014   Past Medical History:  Diagnosis Date  . Arthritis of big toe    left  . Bulging of cervical intervertebral disc   . Chronic ethmoidal sinusitis 12/2016  . Chronic maxillary sinusitis 12/2016  . Dental crowns present   . Deviated nasal septum 12/2016  . Family history of adverse reaction to anesthesia    pt's mother has hx. of being hard to wake up post-op  . Lip laceration 12/28/2016  no stitches required, per pt.  . 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 Sinus headache     Family History  Problem Relation Age of Onset  . Breast cancer Mother   . Arthritis Mother   . Anesthesia problems Mother        hard to wake up post-op  . Arthritis Father   . Diabetes Maternal Grandmother   . Hypertension Maternal Grandmother   . Cancer Maternal Grandfather   . Colon cancer Maternal Grandfather   . Diabetes Paternal Grandmother   . Diabetes Paternal Grandfather   . Diabetes Paternal Aunt   . Colon cancer Paternal Uncle     Past Surgical History:  Procedure Laterality Date  . CHOLECYSTECTOMY  2016  .  COLECTOMY  2013  . COLOSTOMY  2013  . COLOSTOMY REVERSAL  2014  . LEG SURGERY Left 08/12/2005   lateral compartment release  . NASAL SEPTOPLASTY W/ TURBINOPLASTY Bilateral 01/04/2017   Procedure: NASAL SEPTOPLASTY WITH TURBINATE REDUCTION;  Surgeon: Izora Gala, MD;  Location: [facility];  Service: ENT;  Laterality: Bilateral;  . NASAL SINUS SURGERY Bilateral 01/04/2017   Procedure: ENDOSCOPIC SINUS SURGERY;  Surgeon: Izora Gala, MD;  Location: [facility];  Service: ENT;  Laterality: Bilateral;  . SHOULDER SURGERY    . UMBILICAL HERNIA REPAIR  2016   x 2  . URETERAL STENT PLACEMENT  2014   Social History   Occupational History  . Not on file  Tobacco Use  . Smoking status: Never Smoker  . Smokeless tobacco: Never Used  Substance and Sexual Activity  . Alcohol use: Yes    Alcohol/week: 0.0 standard drinks    Comment: socially  . Drug use: No  . Sexual activity: Yes

## 2019-09-05 ENCOUNTER — Ambulatory Visit: Payer: Self-pay

## 2019-09-05 ENCOUNTER — Ambulatory Visit (INDEPENDENT_AMBULATORY_CARE_PROVIDER_SITE_OTHER): Payer: 59 | Admitting: Physical Medicine and Rehabilitation

## 2019-09-05 ENCOUNTER — Other Ambulatory Visit: Payer: Self-pay

## 2019-09-05 ENCOUNTER — Encounter: Payer: Self-pay | Admitting: Physical Medicine and Rehabilitation

## 2019-09-05 VITALS — BP 139/80 | HR 88

## 2019-09-05 DIAGNOSIS — G8929 Other chronic pain: Secondary | ICD-10-CM

## 2019-09-05 DIAGNOSIS — M5416 Radiculopathy, lumbar region: Secondary | ICD-10-CM

## 2019-09-05 DIAGNOSIS — M5442 Lumbago with sciatica, left side: Secondary | ICD-10-CM

## 2019-09-05 MED ORDER — METHYLPREDNISOLONE ACETATE 80 MG/ML IJ SUSP
40.0000 mg | Freq: Once | INTRAMUSCULAR | Status: AC
  Administered 2019-09-05: 40 mg

## 2019-09-05 NOTE — Progress Notes (Signed)
.  Pt states pain in the lower back mostly on the left side that radiates into the left leg all the way down with tingling. Pt states symptoms started in 2017 and had an injection that helped out a lot and returned December 2020. Pt states bending and general activities makes it worse. Laying down helps with symptoms.   .Numeric Pain Rating Scale and Functional Assessment Average Pain 6   In the last MONTH (on 0-10 scale) has pain interfered with the following?  1. General activity like being  able to carry out your everyday physical activities such as walking, climbing stairs, carrying groceries, or moving a chair?  Rating(7)   +Driver, -BT, -Dye Allergies.

## 2019-09-18 ENCOUNTER — Encounter: Payer: Self-pay | Admitting: Family Medicine

## 2019-09-18 DIAGNOSIS — L989 Disorder of the skin and subcutaneous tissue, unspecified: Secondary | ICD-10-CM

## 2019-09-19 ENCOUNTER — Ambulatory Visit (INDEPENDENT_AMBULATORY_CARE_PROVIDER_SITE_OTHER): Payer: 59 | Admitting: Family Medicine

## 2019-09-19 ENCOUNTER — Other Ambulatory Visit: Payer: Self-pay

## 2019-09-19 ENCOUNTER — Encounter: Payer: Self-pay | Admitting: Family Medicine

## 2019-09-19 DIAGNOSIS — R079 Chest pain, unspecified: Secondary | ICD-10-CM | POA: Diagnosis not present

## 2019-09-19 NOTE — Progress Notes (Signed)
Office Visit Note   Patient: Paul Humphrey           Date of Birth: [birth year]           MRN: [id number] Visit Date: 09/19/2019 Requested by: Eunice Blase, MD [address] [place],  [place] [address] PCP: Eunice Blase, MD  Subjective: Chief Complaint  Patient presents with  . Chest - Pain    Some pain in the left anterior chest, vertical and sharp - started 2 nights ago. Wakes him from sleep. Had an episode where the sharp pain radiated to other areas - down the leg to his great toe, up into the groin and to the right upper chest.    HPI: He is here with left-sided chest pain.  Symptoms started 2 nights ago, he woke from sleep with a sharp pain going up and down.  It settled down after a while but he took some aspirin as a precaution.  Last night the same thing happened but this time it shot into his left great toe, and into his right groin area, then to his right chest wall.  He did not feel short of breath, no diaphoresis or nausea.  His main concern is that he used to live in an area where [place] was done, and he says the water was contaminated and caused various medical problems in those who lived there.  At 1 point he underwent cardiac stress testing which was negative.  He was basically asymptomatic after moving away.  No significant family history of cardiac disease.              ROS:   All other systems were reviewed and are negative.  Objective: Vital Signs: There were no vitals taken for this visit.  Physical Exam:  General:  Alert and oriented, in no acute distress. Pulm:  Breathing unlabored. Psy:  Normal mood, congruent affect.  CV: Regular rate and rhythm without murmurs, rubs, or gallops.  No peripheral edema.  2+ radial and posterior tibial pulses. Lungs: Clear to auscultation throughout with no wheezing or areas of consolidation.  Palpation of the chest wall does not reveal any nodules or areas of pain.    Imaging: None today  Assessment & Plan: 1.  Left  sided chest wall pain, probably muscular. -We will order cardiac labs for reassurance.  If there is symptoms return and are worse, he will call EMS. -Cardiac referral if symptoms persist.     Procedures: No procedures performed  No notes on file     PMFS History: Patient Active Problem List   Diagnosis Date Noted  . Class 1 obesity due to excess calories without serious comorbidity with body mass index (BMI) of 33.0 to 33.9 in adult 05/23/2019  . Other specified postprocedural states 11/02/2017  . Impingement syndrome of shoulder region 09/14/2017  . Chronic ethmoidal sinusitis 11/24/2016  . Chronic maxillary sinusitis 11/24/2016  . Deviated septum 11/24/2016  . Pain in both hands 11/23/2016  . Great toe pain, left 11/23/2016  . GERD (gastroesophageal reflux disease) 03/18/2015  . Abdominal pain 01/07/2015  . Tingling 01/07/2015  . Suspected exposure to heavy metals 01/07/2015  . Vitamin D deficiency 01/07/2015  . Epigastric pain 10/04/2014   Past Medical History:  Diagnosis Date  . Arthritis of big toe    left  . Bulging of cervical intervertebral disc   . Chicken pox 1992  . Chronic ethmoidal sinusitis 12/2016  . Chronic maxillary sinusitis 12/2016  . Dental crowns present   .  Deviated nasal septum 12/2016  . Family history of adverse reaction to anesthesia    pt's mother has hx. of being hard to wake up post-op  . Lip laceration 12/28/2016   no stitches required, per pt.  . 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 Sinus headache     Family History  Problem Relation Age of Onset  . Breast cancer Mother   . Arthritis Mother   . Anesthesia problems Mother        hard to wake up post-op  . Arthritis Father   . Diabetes Maternal Grandmother   . Hypertension Maternal Grandmother   . Cancer Maternal Grandfather   . Colon cancer Maternal Grandfather   . Diabetes Paternal Grandmother   . Diabetes Paternal Grandfather   . Diabetes Paternal Aunt    . Colon cancer Paternal Uncle     Past Surgical History:  Procedure Laterality Date  . CHOLECYSTECTOMY  2016  . COLECTOMY  2013  . COLOSTOMY  2013  . COLOSTOMY REVERSAL  2014  . LEG SURGERY Left 08/12/2005   lateral compartment release  . NASAL SEPTOPLASTY W/ TURBINOPLASTY Bilateral 01/04/2017   Procedure: NASAL SEPTOPLASTY WITH TURBINATE REDUCTION;  Surgeon: Izora Gala, MD;  Location: [facility];  Service: ENT;  Laterality: Bilateral;  . NASAL SINUS SURGERY Bilateral 01/04/2017   Procedure: ENDOSCOPIC SINUS SURGERY;  Surgeon: Izora Gala, MD;  Location: [facility];  Service: ENT;  Laterality: Bilateral;  . SHOULDER SURGERY    . UMBILICAL HERNIA REPAIR  2016   x 2  . URETERAL STENT PLACEMENT  2014   Social History   Occupational History  . Not on file  Tobacco Use  . Smoking status: Never Smoker  . Smokeless tobacco: Never Used  Substance and Sexual Activity  . Alcohol use: Yes    Alcohol/week: 0.0 standard drinks    Comment: socially  . Drug use: No  . Sexual activity: Yes

## 2019-09-20 ENCOUNTER — Telehealth: Payer: Self-pay | Admitting: Family Medicine

## 2019-09-20 ENCOUNTER — Encounter: Payer: Self-pay | Admitting: Family Medicine

## 2019-09-20 LAB — D-DIMER, QUANTITATIVE: D-Dimer, Quant: <0.19 mcg/mL FEU (ref <0.50)

## 2019-09-20 LAB — CBC WITH DIFFERENTIAL/PLATELET
Absolute Monocytes: 422 cells/uL (ref 200–950)
Basophils Absolute: 48 cells/uL (ref 0–200)
Basophils Relative: 0.7 %
Eosinophils Absolute: 122 cells/uL (ref 15–500)
Eosinophils Relative: 1.8 %
HCT: 48.4 % (ref 38.5–50.0)
Hemoglobin: 16.5 g/dL (ref 13.2–17.1)
Lymphs Abs: 1319 cells/uL (ref 850–3900)
MCH: 29.9 pg (ref 27.0–33.0)
MCHC: 34.1 g/dL (ref 32.0–36.0)
MCV: 87.8 fL (ref 80.0–100.0)
MPV: 10.4 fL (ref 7.5–12.5)
Monocytes Relative: 6.2 %
Neutro Abs: 4889 cells/uL (ref 1500–7800)
Neutrophils Relative %: 71.9 %
Platelets: 187 10*3/uL (ref 140–400)
RBC: 5.51 10*6/uL (ref 4.20–5.80)
RDW: 12.4 % (ref 11.0–15.0)
Total Lymphocyte: 19.4 %
WBC: 6.8 10*3/uL (ref 3.8–10.8)

## 2019-09-20 LAB — TROPONIN I: Troponin I: <0.01 ng/mL (ref <=0.0)

## 2019-09-20 LAB — COMPREHENSIVE METABOLIC PANEL
AG Ratio: 1.8 (calc) (ref 1.0–2.5)
ALT: 46 U/L (ref 9–46)
AST: 28 U/L (ref 10–40)
Albumin: 4.8 g/dL (ref 3.6–5.1)
Alkaline phosphatase (APISO): 38 U/L (ref 36–130)
BUN: 16 mg/dL (ref 7–25)
CO2: 26 mmol/L (ref 20–32)
Calcium: 9.5 mg/dL (ref 8.6–10.3)
Chloride: 105 mmol/L (ref 98–110)
Creat: 1.13 mg/dL (ref 0.60–1.35)
Globulin: 2.6 g/dL (calc) (ref 1.9–3.7)
Glucose, Bld: 110 mg/dL — ABNORMAL HIGH (ref 65–99)
Potassium: 4.3 mmol/L (ref 3.5–5.3)
Sodium: 140 mmol/L (ref 135–146)
Total Bilirubin: 0.6 mg/dL (ref 0.2–1.2)
Total Protein: 7.4 g/dL (ref 6.1–8.1)

## 2019-09-20 LAB — CK TOTAL AND CKMB (NOT AT ARMC)
CK, MB: 0.7 ng/mL (ref 0–5.0)
Relative Index: 0.7 (ref 0–4.0)
Total CK: 104 U/L (ref 44–196)

## 2019-09-20 NOTE — Telephone Encounter (Signed)
Labs look good.

## 2019-12-25 NOTE — Progress Notes (Signed)
Paul Humphrey - 34 y.o. male MRN [id number]  Date of birth: 1985/10/28  Office Visit Note: Visit Date: 09/05/2019 PCP: Eunice Blase, MD Referred by: Eunice Blase, MD  Subjective: Chief Complaint  Patient presents with  . Lower Back - Pain  . Left Leg - Pain, Tingling   HPI:  Paul Humphrey is a 34 y.o. male who comes in today At the request of Eunice Blase, MD for planned Left L5-S1 lumbar epidural steroid injection with fluoroscopic guidance.  The patient has failed conservative care including home exercise, medications, time and activity modification.  This injection will be diagnostic and hopefully therapeutic.  Please see requesting physician notes for further details and justification.  MRI reviewed with the patient and reviewed below.  Patient had prior left L5 and S1 transforaminal epidural steroid injection in 2017 by Dr. Laroy Apple.  Depending on relief would look at transforaminal approach.   ROS Otherwise per HPI.  Assessment & Plan: Visit Diagnoses:  1. Lumbar radiculopathy   2. Chronic left-sided low back pain with left-sided sciatica     Plan: No additional findings.   Meds & Orders:  Meds ordered this encounter  Medications  . methylPREDNISolone acetate (DEPO-MEDROL) injection 40 mg    Orders Placed This Encounter  Procedures  . XR C-ARM NO REPORT  . Epidural Steroid injection    Follow-up: Return if symptoms worsen or fail to improve.   Procedures: No procedures performed  Lumbar Epidural Steroid Injection - Interlaminar Approach with Fluoroscopic Guidance  Patient: Paul Humphrey      Date of Birth: May 25, 1986 MRN: [id number] PCP: Eunice Blase, MD      Visit Date: 09/05/2019   Universal Protocol:     Consent Given By: the patient  Position: PRONE  Additional Comments: Vital signs were monitored before and after the procedure. Patient was prepped and draped in the usual sterile fashion. The correct patient, procedure, and site was  verified.   Injection Procedure Details:  Procedure Site One Meds Administered:  Meds ordered this encounter  Medications  . methylPREDNISolone acetate (DEPO-MEDROL) injection 40 mg     Laterality: Left  Location/Site:  L5-S1  Needle size: 20 G  Needle type: Tuohy  Needle Placement: Paramedian epidural  Findings:   -Comments: Excellent flow of contrast into the epidural space.  Procedure Details: Using a paramedian approach from the side mentioned above, the region overlying the inferior lamina was localized under fluoroscopic visualization and the soft tissues overlying this structure were infiltrated with 4 ml. of 1% Lidocaine without Epinephrine. The Tuohy needle was inserted into the epidural space using a paramedian approach.   The epidural space was localized using loss of resistance along with lateral and bi-planar fluoroscopic views.  After negative aspirate for air, blood, and CSF, a 2 ml. volume of Isovue-250 was injected into the epidural space and the flow of contrast was observed. Radiographs were obtained for documentation purposes.    The injectate was administered into the level noted above.   Additional Comments:  The patient tolerated the procedure well Dressing: 2 x 2 sterile gauze and Band-Aid    Post-procedure details: Patient was observed during the procedure. Post-procedure instructions were reviewed.  Patient left the clinic in stable condition.    Clinical History: AP and lateral lumbar spine x-ray dated 08/15/2019  This shows very mild thoracolumbar scoliosis in the AP frame with otherwise normal anatomic alignment without listhesis. Very mild degenerative disc changes at L5-S1 with facet arthropathy and maybe some foraminal  narrowing. Hips are well seated with no degenerative change. Sacroiliac joints are well-maintained without sclerosis. --- MRI LUMBAR SPINE WITHOUT CONTRAST  TECHNIQUE: Multiplanar, multisequence MR imaging of the  lumbar spine was performed. No intravenous contrast was administered.  COMPARISON: None.  FINDINGS: Segmentation: 5 lumbar type vertebral bodies.  Alignment: Normal  Vertebrae: No fracture or primary bone lesion.  Conus medullaris: Extends to the L1 level and appears normal.  Paraspinal and other soft tissues: Normal  Disc levels:  L3-4 and above: Normal.  L4-5: Central disc herniation with cephalad migration behind L4. This indents the thecal sac but does not cause visible neural compression. There is no right or left bilaterality.  L5-S1: Disc degeneration with a central to slightly right-sided disc herniation. This does not have any visible affect upon the thecal sac or the S1 nerve roots, but could be associated with low back pain or neural irritation.  IMPRESSION: L4-5: Central disc herniation with cephalad migration behind L4. This indents the thecal sac but does not cause visible neural compression. No right or left laterality.  L5-S1: Disc herniation centrally and towards the right. This approaches the right S1 nerve root but does not compress or displace that structure. No affect upon the thecal sac.  Both of these findings could be a cause of back pain or neural irritation, though distinct neural compression is not demonstrated.   Electronically Signed By: Nelson Chimes M.D. On: 05/31/2016 11:26     Objective:  VS:  HT:    WT:   BMI:     BP:139/80  HR:88bpm  TEMP: ( )  RESP:  Physical Exam  Ortho Exam Imaging: No results found.

## 2019-12-25 NOTE — Procedures (Signed)
Lumbar Epidural Steroid Injection - Interlaminar Approach with Fluoroscopic Guidance  Patient: Paul Humphrey      Date of Birth: 07-Dec-1985 MRN: [id number] PCP: Eunice Blase, MD      Visit Date: 09/05/2019   Universal Protocol:     Consent Given By: the patient  Position: PRONE  Additional Comments: Vital signs were monitored before and after the procedure. Patient was prepped and draped in the usual sterile fashion. The correct patient, procedure, and site was verified.   Injection Procedure Details:  Procedure Site One Meds Administered:  Meds ordered this encounter  Medications  . methylPREDNISolone acetate (DEPO-MEDROL) injection 40 mg     Laterality: Left  Location/Site:  L5-S1  Needle size: 20 G  Needle type: Tuohy  Needle Placement: Paramedian epidural  Findings:   -Comments: Excellent flow of contrast into the epidural space.  Procedure Details: Using a paramedian approach from the side mentioned above, the region overlying the inferior lamina was localized under fluoroscopic visualization and the soft tissues overlying this structure were infiltrated with 4 ml. of 1% Lidocaine without Epinephrine. The Tuohy needle was inserted into the epidural space using a paramedian approach.   The epidural space was localized using loss of resistance along with lateral and bi-planar fluoroscopic views.  After negative aspirate for air, blood, and CSF, a 2 ml. volume of Isovue-250 was injected into the epidural space and the flow of contrast was observed. Radiographs were obtained for documentation purposes.    The injectate was administered into the level noted above.   Additional Comments:  The patient tolerated the procedure well Dressing: 2 x 2 sterile gauze and Band-Aid    Post-procedure details: Patient was observed during the procedure. Post-procedure instructions were reviewed.  Patient left the clinic in stable condition.

## 2020-01-02 ENCOUNTER — Ambulatory Visit (INDEPENDENT_AMBULATORY_CARE_PROVIDER_SITE_OTHER): Payer: 59 | Admitting: Family Medicine

## 2020-01-02 ENCOUNTER — Encounter: Payer: Self-pay | Admitting: Family Medicine

## 2020-01-02 ENCOUNTER — Other Ambulatory Visit: Payer: Self-pay

## 2020-01-02 VITALS — BP 135/94 | HR 78 | Ht 73.0 in | Wt 266.2 lb

## 2020-01-02 DIAGNOSIS — M2550 Pain in unspecified joint: Secondary | ICD-10-CM | POA: Diagnosis not present

## 2020-01-02 DIAGNOSIS — Z6833 Body mass index (BMI) 33.0-33.9, adult: Secondary | ICD-10-CM

## 2020-01-02 DIAGNOSIS — M7989 Other specified soft tissue disorders: Secondary | ICD-10-CM

## 2020-01-02 DIAGNOSIS — E559 Vitamin D deficiency, unspecified: Secondary | ICD-10-CM

## 2020-01-02 DIAGNOSIS — R739 Hyperglycemia, unspecified: Secondary | ICD-10-CM

## 2020-01-02 DIAGNOSIS — R220 Localized swelling, mass and lump, head: Secondary | ICD-10-CM

## 2020-01-02 DIAGNOSIS — E6609 Other obesity due to excess calories: Secondary | ICD-10-CM

## 2020-01-02 DIAGNOSIS — Z0000 Encounter for general adult medical examination without abnormal findings: Secondary | ICD-10-CM | POA: Diagnosis not present

## 2020-01-02 DIAGNOSIS — E781 Pure hyperglyceridemia: Secondary | ICD-10-CM

## 2020-01-02 DIAGNOSIS — R0789 Other chest pain: Secondary | ICD-10-CM

## 2020-01-02 NOTE — Progress Notes (Signed)
Office Visit Note   Patient: Paul Humphrey           Date of Birth: 01/02/1986           MRN: [id number] Visit Date: 01/02/2020 Requested by: Eunice Blase, MD [address] [place],  [place] [address] PCP: Eunice Blase, MD  Subjective: Chief Complaint  Patient presents with  . Annual Exam    HPI: He is here for a wellness examination.  His insurance has changed and he is required to have 1.  He does have some ongoing concerns.  He states that his chest wall and upper arms as well as his face and neck have been swelling intermittently, usually at the end of the day.  He is concerned that he has symptoms consistent with lymphedema.  His mother has lymphedema status post breast cancer treatment and she wears a chest compression device.  Patient tried this and it improved his symptoms.  He does not have noticeable indention in his skin after wearing it.  He notes that he was told that his teeth have shifted slightly due to swelling.  He was also told that his strabismus has worsened a little bit at his most recent eye exam related to swelling.  He thinks this all could be connected to living in [place] around [place].  He also notes that his chest symptoms seem to be worse after completing his COVID-19 vaccine 1 month ago.  He has occasional aches and pains in various joints.  His back pain has improved significantly after epidural injection.  He has not been able to implement healthy dietary changes very well due to his busy work schedule.                ROS: He has chronic fatigue symptoms have remained stable.  No bowel or bladder dysfunction.  All other systems were reviewed and are negative.  Objective: Vital Signs: BP (!) 135/94   Pulse 78   Ht 6\' 1"  (1.854 m)   Wt 266 lb 3.2 oz (120.7 kg)   BMI 35.12 kg/m   Physical Exam:  General:  Alert and oriented, in no acute distress. Pulm:  Breathing unlabored. Psy:  Normal mood, congruent affect. Skin: No suspicious lesions  seen today. HEENT:  [place]/AT, PERRLA, EOM Full, no nystagmus.  Funduscopic examination within normal limits.  No conjunctival erythema.  Tympanic membranes are pearly gray with normal landmarks.  External ear canals are normal.  Nasal passages are clear.  Oropharynx is clear.  No significant lymphadenopathy.  No thyromegaly or nodules.  2+ carotid pulses without bruits. CV: Regular rate and rhythm without murmurs, rubs, or gallops.  No peripheral edema.  2+ radial and posterior tibial pulses. Lungs: Clear to auscultation throughout with no wheezing or areas of consolidation. Abd: Protuberant, bowel sounds are active, no hepatosplenomegaly or masses.  Soft and nontender.  No audible bruits.  No evidence of ascites. Extremities: No nail deformities.   Imaging: No results found.  Assessment & Plan: 1.  Wellness examination -Routine labs drawn for this today.  2.  Chest wall, face and neck swelling, etiology uncertain. -We will draw some additional labs to evaluate for metabolic causes of this.  We will also refer him to cardiology and vascular specialist.  3.  Hyperglycemia with hypertriglyceridemia -Recheck labs along with A1c today.  4.  Vitamin D deficiency -We will monitor his therapy.     Procedures: No procedures performed  No notes on file     PMFS  History: Patient Active Problem List   Diagnosis Date Noted  . Class 1 obesity due to excess calories without serious comorbidity with body mass index (BMI) of 33.0 to 33.9 in adult 05/23/2019  . Other specified postprocedural states 11/02/2017  . Impingement syndrome of shoulder region 09/14/2017  . Chronic ethmoidal sinusitis 11/24/2016  . Chronic maxillary sinusitis 11/24/2016  . Deviated septum 11/24/2016  . Pain in both hands 11/23/2016  . Great toe pain, left 11/23/2016  . GERD (gastroesophageal reflux disease) 03/18/2015  . Abdominal pain 01/07/2015  . Tingling 01/07/2015  . Suspected exposure to heavy metals  01/07/2015  . Vitamin D deficiency 01/07/2015  . Epigastric pain 10/04/2014   Past Medical History:  Diagnosis Date  . Arthritis of big toe    left  . Bulging of cervical intervertebral disc   . Chicken pox 1992  . Chronic ethmoidal sinusitis 12/2016  . Chronic maxillary sinusitis 12/2016  . Dental crowns present   . Deviated nasal septum 12/2016  . Family history of adverse reaction to anesthesia    pt's mother has hx. of being hard to wake up post-op  . Lip laceration 12/28/2016   no stitches required, per pt.  . 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 Sinus headache     Family History  Problem Relation Age of Onset  . Breast cancer Mother   . Arthritis Mother   . Anesthesia problems Mother        hard to wake up post-op  . Arthritis Father   . Diabetes Maternal Grandmother   . Hypertension Maternal Grandmother   . Cancer Maternal Grandfather   . Colon cancer Maternal Grandfather   . Diabetes Paternal Grandmother   . Diabetes Paternal Grandfather   . Diabetes Paternal Aunt   . Colon cancer Paternal Uncle     Past Surgical History:  Procedure Laterality Date  . CHOLECYSTECTOMY  2016  . COLECTOMY  2013  . COLOSTOMY  2013  . COLOSTOMY REVERSAL  2014  . LEG SURGERY Left 08/12/2005   lateral compartment release  . NASAL SEPTOPLASTY W/ TURBINOPLASTY Bilateral 01/04/2017   Procedure: NASAL SEPTOPLASTY WITH TURBINATE REDUCTION;  Surgeon: Izora Gala, MD;  Location: [facility];  Service: ENT;  Laterality: Bilateral;  . NASAL SINUS SURGERY Bilateral 01/04/2017   Procedure: ENDOSCOPIC SINUS SURGERY;  Surgeon: Izora Gala, MD;  Location: [facility];  Service: ENT;  Laterality: Bilateral;  . SHOULDER SURGERY    . UMBILICAL HERNIA REPAIR  2016   x 2  . URETERAL STENT PLACEMENT  2014   Social History   Occupational History  . Not on file  Tobacco Use  . Smoking status: Never Smoker  . Smokeless tobacco: Never Used    Substance and Sexual Activity  . Alcohol use: Yes    Alcohol/week: 0.0 standard drinks    Comment: socially  . Drug use: No  . Sexual activity: Yes

## 2020-01-04 NOTE — Progress Notes (Signed)
Cardiology Office Note:   Date:  01/05/2020  NAME:  Paul Humphrey    MRN: [id number] DOB:  [birth year]   PCP:  Eunice Blase, MD  Cardiologist:  Evalina Field, MD   Referring MD: Eunice Blase, MD   Chief Complaint  Patient presents with  . Chest Pain   History of Present Illness:   Paul Humphrey is a 34 y.o. male with a hx of sinusitis who is being seen today for the evaluation of chest pain at the request of Hilts, Michael, MD.  He presents for pain in his chest as well as swelling in his chest.  He reports he had a second coronavirus vaccine on 12/04/2019.  He reports shortly after this he began to develop symptoms of what he reports as swelling in his chest wall as well as back.  He also reports a constant dull pain rated 5 out of 10 in his chest.  He reports this feels different than a muscle strain.  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  He does heavy lifting and heavy activity.  He reports he gets this pain daily.  Report is constant.  He reports that movement and going about his day does make it better.  He reports that at the end of the day it seems to be a little worse.  Also worse in the morning.  He also reports that his chest swells and has noticed swelling in the right chest that can also move into the left chest.  This can also associate into the back.  His mother did have breast cancer and radiation therapy and has lymphedema as a complication of this.  He is concerned he possibly has lymphedema.  He has no chronic medical problems such as hypertension or hyperlipidemia.  His EKG today is extremely normal.  He denies any chest pressure chest pain with exertion.  He has no limitations of activity during the day.  He is a never smoker and rarely consumes alcohol.  Regarding surgery he had a colectomy due to trauma.  He did not have any inflammatory bowel disease.  He has no stigmata of autoimmune diseases.  He does have a history of heart disease  in his grandmothers on both side.  Laboratory data from PCP showed total cholesterol 150, HDL 37, LDL 86, triglycerides 169, A1c 4.5.  Serum creatinine 1.13, hemoglobin 16.5.,  TSH 2.2.  Past Medical History: Past Medical History:  Diagnosis Date  . Arthritis of big toe    left  . Bulging of cervical intervertebral disc   . Chicken pox 1992  . Chronic ethmoidal sinusitis 12/2016  . Chronic maxillary sinusitis 12/2016  . Dental crowns present   . Deviated nasal septum 12/2016  . Family history of adverse reaction to anesthesia    pt's mother has hx. of being hard to wake up post-op  . Lip laceration 12/28/2016   no stitches required, per pt.  . 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 Sinus headache     Past Surgical History: Past Surgical History:  Procedure Laterality Date  . CHOLECYSTECTOMY  2016  . COLECTOMY  2013  . COLOSTOMY  2013  . COLOSTOMY REVERSAL  2014  . LEG SURGERY Left 08/12/2005   lateral compartment release  . NASAL SEPTOPLASTY W/ TURBINOPLASTY Bilateral 01/04/2017   Procedure: NASAL SEPTOPLASTY WITH TURBINATE REDUCTION;  Surgeon: Izora Gala, MD;  Location: [place];  Service: ENT;  Laterality: Bilateral;  .  NASAL SINUS SURGERY Bilateral 01/04/2017   Procedure: ENDOSCOPIC SINUS SURGERY;  Surgeon: Izora Gala, MD;  Location: [place];  Service: ENT;  Laterality: Bilateral;  . SHOULDER SURGERY    . UMBILICAL HERNIA REPAIR  2016   x 2  . URETERAL STENT PLACEMENT  2014    Current Medications: Current Meds  Medication Sig  . Multiple Vitamins-Minerals (MULTIVITAMIN ADULT PO) Take 1 tablet by mouth daily. Reported on 01/18/2016  . [DISCONTINUED] baclofen (LIORESAL) 10 MG tablet Take 0.5-1 tablets (5-10 mg total) by mouth at bedtime as needed for muscle spasms.  . [DISCONTINUED] HYDROcodone-acetaminophen (NORCO/VICODIN) 5-325 MG tablet Take 1 tablet by mouth every 6 (six) hours as needed for moderate pain.    . [DISCONTINUED] TURMERIC CURCUMIN PO Take by mouth daily.     Allergies:    Gadolinium derivatives, Lactose intolerance (gi), Percocet [oxycodone-acetaminophen], and Fentanyl   Social History: Social History   Socioeconomic History  . Marital status: Single    Spouse name: Not on file  . Number of children: Not on file  . Years of education: Not on file  . Highest education level: Not on file  Occupational History  . Not on file  Tobacco Use  . Smoking status: Never Smoker  . Smokeless tobacco: Never Used  Substance and Sexual Activity  . Alcohol use: Yes    Alcohol/week: 0.0 standard drinks    Comment: socially  . Drug use: No  . Sexual activity: Yes  Other Topics Concern  . Not on file  Social History Narrative   Lives with parents in a 2 story home.     Has no children.    Works as a Pharmacist, hospital, looking for a job here.    Highest level of education:  MFA   Social Determinants of Health   Financial Resource Strain:   . Difficulty of Paying Living Expenses:   Food Insecurity:   . Worried About Charity fundraiser in the Last Year:   . Arboriculturist in the Last Year:   Transportation Needs:   . Film/video editor (Medical):   Marland Kitchen Lack of Transportation (Non-Medical):   Physical Activity:   . Days of Exercise per Week:   . Minutes of Exercise per Session:   Stress:   . Feeling of Stress :   Social Connections:   . Frequency of Communication with Friends and Family:   . Frequency of Social Gatherings with Friends and Family:   . Attends Religious Services:   . Active Member of Clubs or Organizations:   . Attends Archivist Meetings:   Marland Kitchen Marital Status:      Family History: The patient's family history includes Anesthesia problems in his mother; Arthritis in his father and mother; Breast cancer in his mother; Cancer in his maternal grandfather; Colon cancer in his maternal grandfather and paternal uncle; Diabetes in his maternal grandmother,  paternal aunt, paternal grandfather, and paternal grandmother; Heart disease in his maternal grandmother and paternal grandmother; Hypertension in his maternal grandmother.  ROS:   All other ROS reviewed and negative. Pertinent positives noted in the HPI.     EKGs/Labs/Other Studies Reviewed:   The following studies were personally reviewed by me today:  EKG:  EKG is ordered today.  The ekg ordered today demonstrates Normal sinus rhythm, heart rate 75, no acute ST-T changes, no evidence of prior infarction, normal EKG, and was personally reviewed by me.   Recent Labs: 09/19/2019: ALT 46; BUN  16; Creat 1.13; Hemoglobin 16.5; Platelets 187; Potassium 4.3; Sodium 140   Recent Lipid Panel    Component Value Date/Time   CHOL 132 05/23/2019 0855   CHOL 141 02/28/2018 0822   TRIG 160 (H) 05/23/2019 0855   HDL 31 (L) 05/23/2019 0855   HDL 27 (L) 02/28/2018 0822   CHOLHDL 4.3 05/23/2019 0855   VLDL 30 03/24/2016 1551   LDLCALC 75 05/23/2019 0855    Physical Exam:   VS:  BP 126/84   Pulse 75   Ht 6\' 2"  (1.88 m)   Wt 268 lb 12.8 oz (121.9 kg)   SpO2 98%   BMI 34.51 kg/m    Wt Readings from Last 3 Encounters:  01/05/20 268 lb 12.8 oz (121.9 kg)  01/02/20 266 lb 3.2 oz (120.7 kg)  05/23/19 253 lb (114.8 kg)    General: Well nourished, well developed, in no acute distress Heart: Atraumatic, normal size  Eyes: PEERLA, EOMI  Neck: Supple, no JVD Endocrine: No thryomegaly Cardiac: Normal S1, S2; RRR; no murmurs, rubs, or gallops Lungs: Clear to auscultation bilaterally, no wheezing, rhonchi or rales  Abd: Soft, nontender, no hepatomegaly  Ext: No edema, pulses 2+ Musculoskeletal: No deformities, BUE and BLE strength normal and equal Skin: Warm and dry, no rashes   Neuro: Alert and oriented to person, place, time, and situation, CNII-XII grossly intact, no focal deficits  Psych: Normal mood and affect   ASSESSMENT:   Jovonta Peche is a 34 y.o. male who presents for the following: 1.  Other chest pain   2. Arm swelling     PLAN:   1. Other chest pain 2. Arm swelling -He presents with atypical chest pain.  His EKG today shows normal sinus rhythm with no acute ST-T changes no evidence of prior infarction.  This is a very normal EKG.  His symptoms of constant pain are more consistent with a possible musculoskeletal pain.  It is improved with activity.  This is inconsistent with cardiac pain.  He has 2+ pulses in the carotids, upper extremities and lower extremities.  I see no stigmata of heart failure on examination.  He has no murmurs.  He has no evidence of lymphedema from what I can tell.  He has no significant risk factors for this.  I see no evidence of vascular obstruction on exam today.  He does report it is much better today.  This could just be some musculoskeletal issue he is having.  If there is concern to exclusively rule out any vascular obstruction or lymphedema I would recommend a CTA of his chest that would include venous phase.  This would definitively exclude any potential obstruction.  However, on my examination I have low suspicion for this.  He does report that his pain is improved with ice pack which is more consistent with musculoskeletal pathology.  I will defer any further work-up to his primary care physician.  He will reach back out to [place] if he needs [place].  Disposition: Return if symptoms worsen or fail to improve.  Medication Adjustments/Labs and Tests Ordered: Current medicines are reviewed at length with the patient today.  Concerns regarding medicines are outlined above.  Orders Placed This Encounter  Procedures  . EKG 12-Lead   No orders of the defined types were placed in this encounter.   Patient Instructions  Medication Instructions:  The current medical regimen is effective;  continue present plan and medications.  *If you need a refill on your cardiac medications before  your next appointment, please call your pharmacy*    Follow-Up: At  [facility], you and your health needs are our priority.  As part of our continuing mission to provide you with exceptional heart care, we have created designated Provider Care Teams.  These Care Teams include your primary Cardiologist (physician) and Advanced Practice Providers (APPs -  Physician Assistants and Nurse Practitioners) who all work together to provide you with the care you need, when you need it.  We recommend signing up for the patient portal called "MyChart".  Sign up information is provided on this After Visit Summary.  MyChart is used to connect with patients for Virtual Visits (Telemedicine).  Patients are able to view lab/test results, encounter notes, upcoming appointments, etc.  Non-urgent messages can be sent to your provider as well.   To learn more about what you can do with MyChart, go to [email or web address].    Your next appointment:   As needed  The format for your next appointment:   Either In Person or Virtual  Provider:   Eleonore Chiquito, MD      Signed, Addison Naegeli. Audie Box, [place]  [address] [place], [place] [address] [phone]  01/05/2020 8:23 AM

## 2020-01-05 ENCOUNTER — Encounter: Payer: Self-pay | Admitting: Cardiovascular Disease

## 2020-01-05 ENCOUNTER — Ambulatory Visit (INDEPENDENT_AMBULATORY_CARE_PROVIDER_SITE_OTHER): Payer: 59 | Admitting: Cardiovascular Disease

## 2020-01-05 ENCOUNTER — Telehealth: Payer: Self-pay | Admitting: Family Medicine

## 2020-01-05 ENCOUNTER — Other Ambulatory Visit: Payer: Self-pay

## 2020-01-05 VITALS — BP 126/84 | HR 75 | Ht 74.0 in | Wt 268.8 lb

## 2020-01-05 DIAGNOSIS — R0789 Other chest pain: Secondary | ICD-10-CM

## 2020-01-05 DIAGNOSIS — M7989 Other specified soft tissue disorders: Secondary | ICD-10-CM

## 2020-01-05 LAB — CBC WITH DIFFERENTIAL/PLATELET
Absolute Monocytes: 468 cells/uL (ref 200–950)
Basophils Absolute: 52 cells/uL (ref 0–200)
Basophils Relative: 0.8 %
Eosinophils Absolute: 163 cells/uL (ref 15–500)
Eosinophils Relative: 2.5 %
HCT: 49.6 % (ref 38.5–50.0)
Hemoglobin: 16.5 g/dL (ref 13.2–17.1)
Lymphs Abs: 1625 cells/uL (ref 850–3900)
MCH: 29.6 pg (ref 27.0–33.0)
MCHC: 33.3 g/dL (ref 32.0–36.0)
MCV: 89 fL (ref 80.0–100.0)
MPV: 10.9 fL (ref 7.5–12.5)
Monocytes Relative: 7.2 %
Neutro Abs: 4193 cells/uL (ref 1500–7800)
Neutrophils Relative %: 64.5 %
Platelets: 176 10*3/uL (ref 140–400)
RBC: 5.57 10*6/uL (ref 4.20–5.80)
RDW: 13 % (ref 11.0–15.0)
Total Lymphocyte: 25 %
WBC: 6.5 10*3/uL (ref 3.8–10.8)

## 2020-01-05 LAB — COMPREHENSIVE METABOLIC PANEL
AG Ratio: 1.6 (calc) (ref 1.0–2.5)
ALT: 47 U/L — ABNORMAL HIGH (ref 9–46)
AST: 26 U/L (ref 10–40)
Albumin: 4.4 g/dL (ref 3.6–5.1)
Alkaline phosphatase (APISO): 42 U/L (ref 36–130)
BUN: 16 mg/dL (ref 7–25)
CO2: 23 mmol/L (ref 20–32)
Calcium: 9.1 mg/dL (ref 8.6–10.3)
Chloride: 106 mmol/L (ref 98–110)
Creat: 0.94 mg/dL (ref 0.60–1.35)
Globulin: 2.7 g/dL (calc) (ref 1.9–3.7)
Glucose, Bld: 84 mg/dL (ref 65–99)
Potassium: 4.3 mmol/L (ref 3.5–5.3)
Sodium: 139 mmol/L (ref 135–146)
Total Bilirubin: 0.6 mg/dL (ref 0.2–1.2)
Total Protein: 7.1 g/dL (ref 6.1–8.1)

## 2020-01-05 LAB — VITAMIN D 25 HYDROXY (VIT D DEFICIENCY, FRACTURES): Vit D, 25-Hydroxy: 27 ng/mL — ABNORMAL LOW (ref 30–100)

## 2020-01-05 LAB — ANA: Anti Nuclear Antibody (ANA): NEGATIVE

## 2020-01-05 LAB — THYROID PANEL WITH TSH
Free Thyroxine Index: 2.3 (ref 1.4–3.8)
T3 Uptake: 34 % (ref 22–35)
T4, Total: 6.8 ug/dL (ref 4.9–10.5)
TSH: 2.27 mIU/L (ref 0.40–4.50)

## 2020-01-05 LAB — THYROID PEROXIDASE ANTIBODY: Thyroperoxidase Ab SerPl-aCnc: 1 IU/mL (ref <9)

## 2020-01-05 LAB — HEMOGLOBIN A1C
Hgb A1c MFr Bld: 4.5 % of total Hgb (ref <5.7)
Mean Plasma Glucose: 82 (calc)
eAG (mmol/L): 4.6 (calc)

## 2020-01-05 LAB — LIPID PANEL
Cholesterol: 150 mg/dL (ref <200)
HDL: 37 mg/dL — ABNORMAL LOW (ref >=40)
LDL Cholesterol (Calc): 86 mg/dL (calc)
Non-HDL Cholesterol (Calc): 113 mg/dL (calc) (ref <130)
Total CHOL/HDL Ratio: 4.1 (calc) (ref <5.0)
Triglycerides: 169 mg/dL — ABNORMAL HIGH (ref <150)

## 2020-01-05 LAB — HIGH SENSITIVITY CRP: hs-CRP: 0.7 mg/L

## 2020-01-05 NOTE — Patient Instructions (Signed)
Medication Instructions:  The current medical regimen is effective;  continue present plan and medications.  *If you need a refill on your cardiac medications before your next appointment, please call your pharmacy*   Follow-Up: At [facility], you and your health needs are our priority.  As part of our continuing mission to provide you with exceptional heart care, we have created designated Provider Care Teams.  These Care Teams include your primary Cardiologist (physician) and Advanced Practice Providers (APPs -  Physician Assistants and Nurse Practitioners) who all work together to provide you with the care you need, when you need it.  We recommend signing up for the patient portal called "MyChart".  Sign up information is provided on this After Visit Summary.  MyChart is used to connect with patients for Virtual Visits (Telemedicine).  Patients are able to view lab/test results, encounter notes, upcoming appointments, etc.  Non-urgent messages can be sent to your provider as well.   To learn more about what you can do with MyChart, go to [email or web address].    Your next appointment:   As needed  The format for your next appointment:   Either In Person or Virtual  Provider:   [place] O'Neal, MD      

## 2020-01-05 NOTE — Telephone Encounter (Signed)
Labs all look good except for vitamin D which is slightly low at 27.  We want this to be 50-80.  I recommend taking vitamin D3 at 5000 IU daily.  We should recheck in 4 to 6 months to be sure it is in target range.  Low vitamin D can cause musculoskeletal pain.

## 2020-01-11 ENCOUNTER — Telehealth: Payer: Self-pay

## 2020-01-11 NOTE — Telephone Encounter (Signed)
Reuben Likes with [facility] called stating that patient has seen cardiologist and he is going to get a CTA of chest to R/O Vascular Obstruction and Lymphedema.  Stated that she is going to hold the referral.  Cb# [phone].  Please advise.  Thank you.

## 2020-01-12 NOTE — Telephone Encounter (Signed)
No, she would like a call back.  Thank you.

## 2020-01-12 NOTE — Telephone Encounter (Signed)
Is this just an FYI? In the referrals, it looks like they are wanting a call back from Dr. Junius Roads.

## 2020-01-12 NOTE — Telephone Encounter (Signed)
Ok to hold the referral.

## 2020-01-12 NOTE — Telephone Encounter (Signed)
I called and advised Paul Humphrey that it is ok to hold that referral, that we will defer to cardiology. She said she will just close this referral and if we need them, we can just put in a new referral.

## 2020-01-12 NOTE — Telephone Encounter (Signed)
Please advise if it is ok for them to hold that referral, since the cardiologist is ordering a workup as well.

## 2020-01-25 ENCOUNTER — Telehealth: Payer: Self-pay

## 2020-01-25 NOTE — Telephone Encounter (Signed)
I advised the patient to start 5,000 iu daily. Will recheck this level again in August - lab appointment made for 03/28/20.

## 2020-01-25 NOTE — Telephone Encounter (Signed)
Despite what he's taking, his level is too low.  Have him try the dosage I suggested for a couple months and we'll recheck his levels.

## 2020-01-25 NOTE — Telephone Encounter (Signed)
I called the patient about his lab results, as he had not read his MyChart message from Dr. Junius Roads. I advised him that he should take 5,000 iu of vitamin D3 daily. He says low vitamin D has been a problem for him for about 5 years. He currently takes 2 of the 2,000 iu strength every other day. Will this change the instructions on the vitamin D3? Also, the patient wonders if there is something else going on, causing the musculoskeletal pains (because he has been taking vitamin D3 already). Please advise.

## 2020-02-18 ENCOUNTER — Encounter: Payer: Self-pay | Admitting: Family Medicine

## 2020-03-18 ENCOUNTER — Encounter: Payer: Self-pay | Admitting: Family Medicine

## 2020-03-25 ENCOUNTER — Telehealth: Payer: Self-pay

## 2020-03-25 NOTE — Telephone Encounter (Signed)
I called. He thought the appointment on 8/05 was with Dr. Junius Roads and not just for blood work. As the blood drawing does not take long, he said he will keep that appointment.
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
## 2020-03-28 ENCOUNTER — Ambulatory Visit (INDEPENDENT_AMBULATORY_CARE_PROVIDER_SITE_OTHER): Payer: 59

## 2020-03-28 ENCOUNTER — Other Ambulatory Visit: Payer: Self-pay

## 2020-03-28 DIAGNOSIS — E559 Vitamin D deficiency, unspecified: Secondary | ICD-10-CM

## 2020-03-28 LAB — VITAMIN D 25 HYDROXY (VIT D DEFICIENCY, FRACTURES): Vit D, 25-Hydroxy: 31 ng/mL (ref 30–100)

## 2020-03-28 NOTE — Progress Notes (Signed)
Lab visit only: Vitamin D level drawn. Patient is currently taking 8,000 iu daily (not taken yet today).

## 2020-03-29 ENCOUNTER — Telehealth: Payer: Self-pay | Admitting: Family Medicine

## 2020-03-29 NOTE — Telephone Encounter (Signed)
Vitamin D is better, but still not at the 50-80 range.

## 2020-03-31 ENCOUNTER — Other Ambulatory Visit: Payer: Self-pay | Admitting: Family Medicine

## 2020-04-11 IMAGING — CR DG CHEST 2V
2 series · 2 of 2 positions shown · non-contrast
Comparison: 05/27/2018

CLINICAL DATA: Sinusitis, fever, rash

EXAM:
CHEST - 2 VIEW

[chest pa]
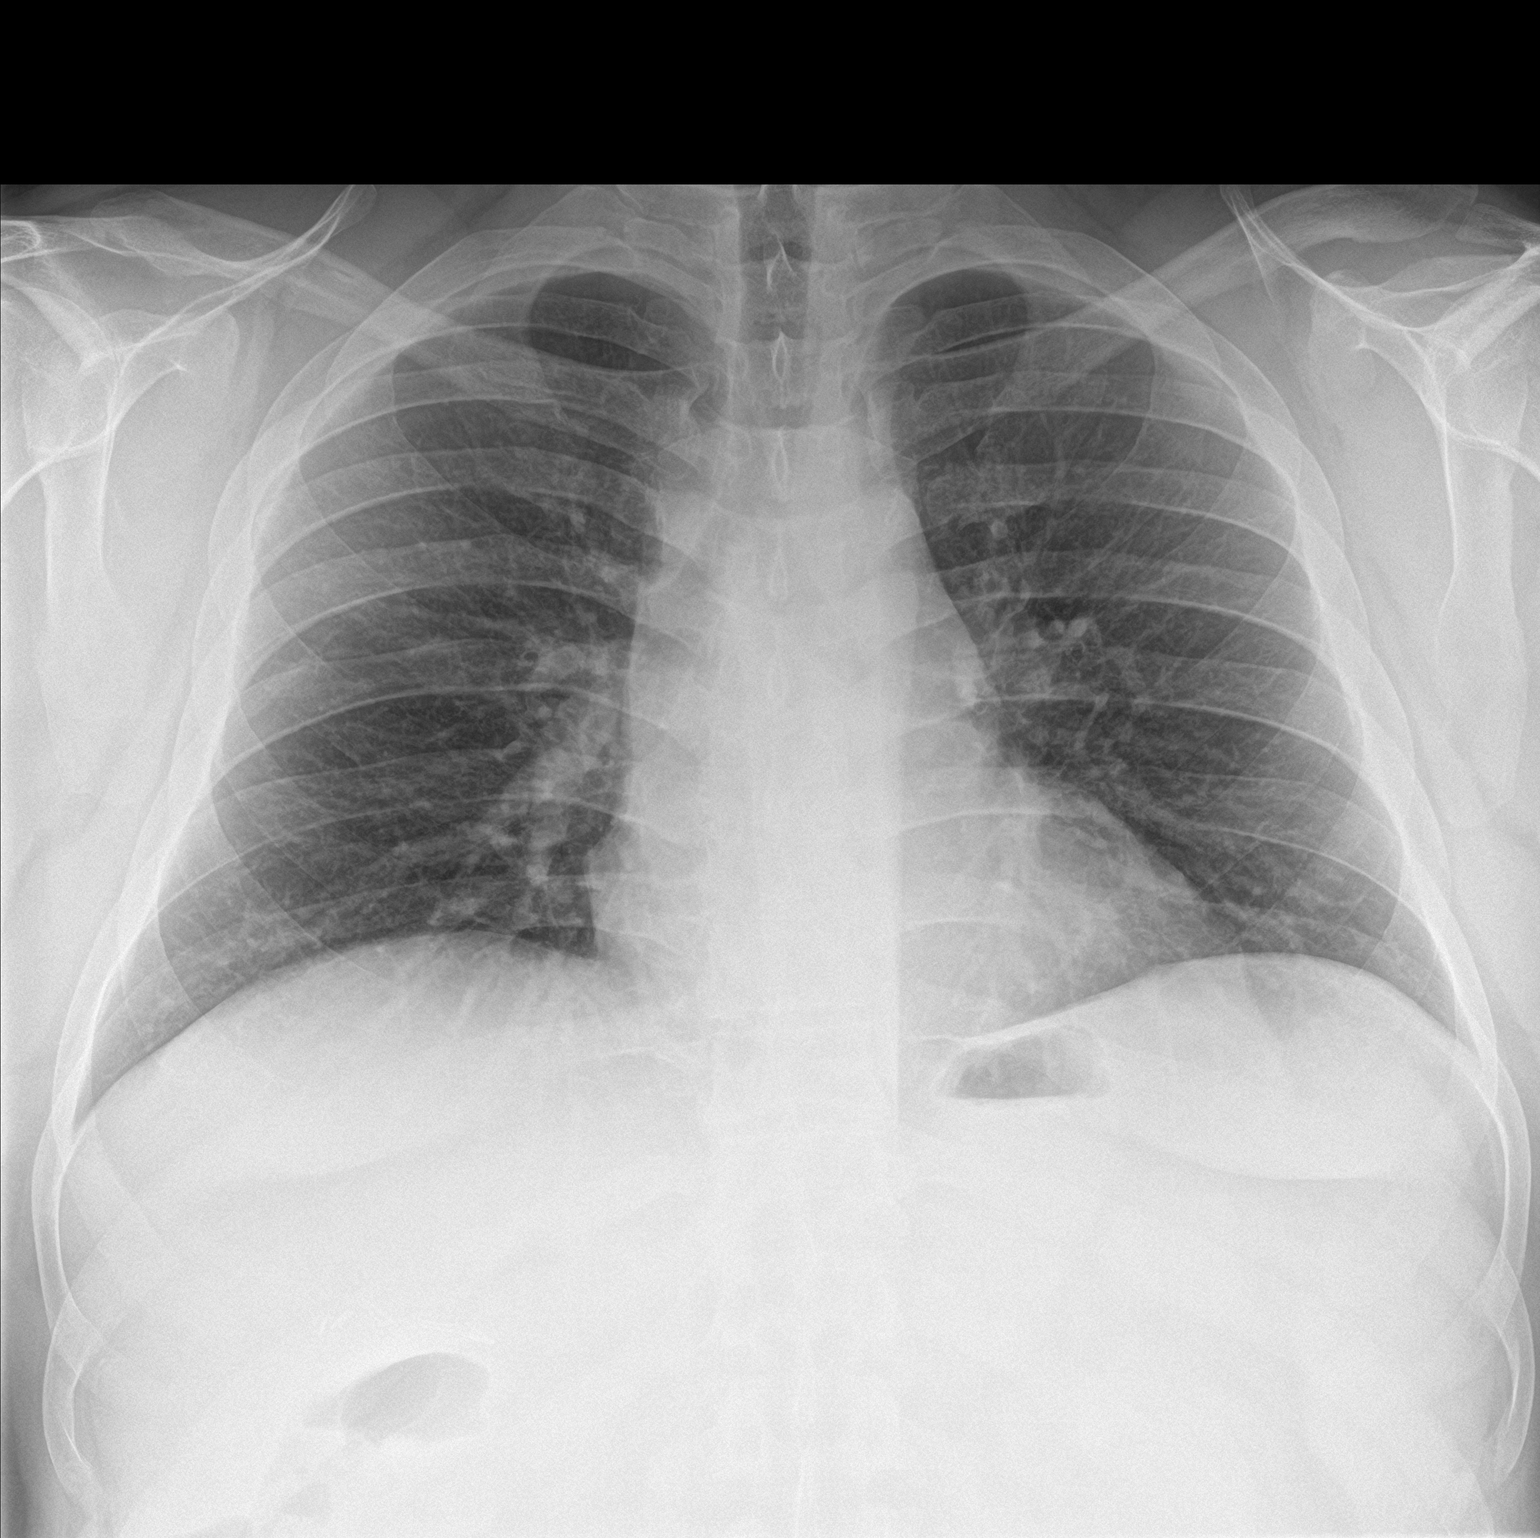

[chest lat]
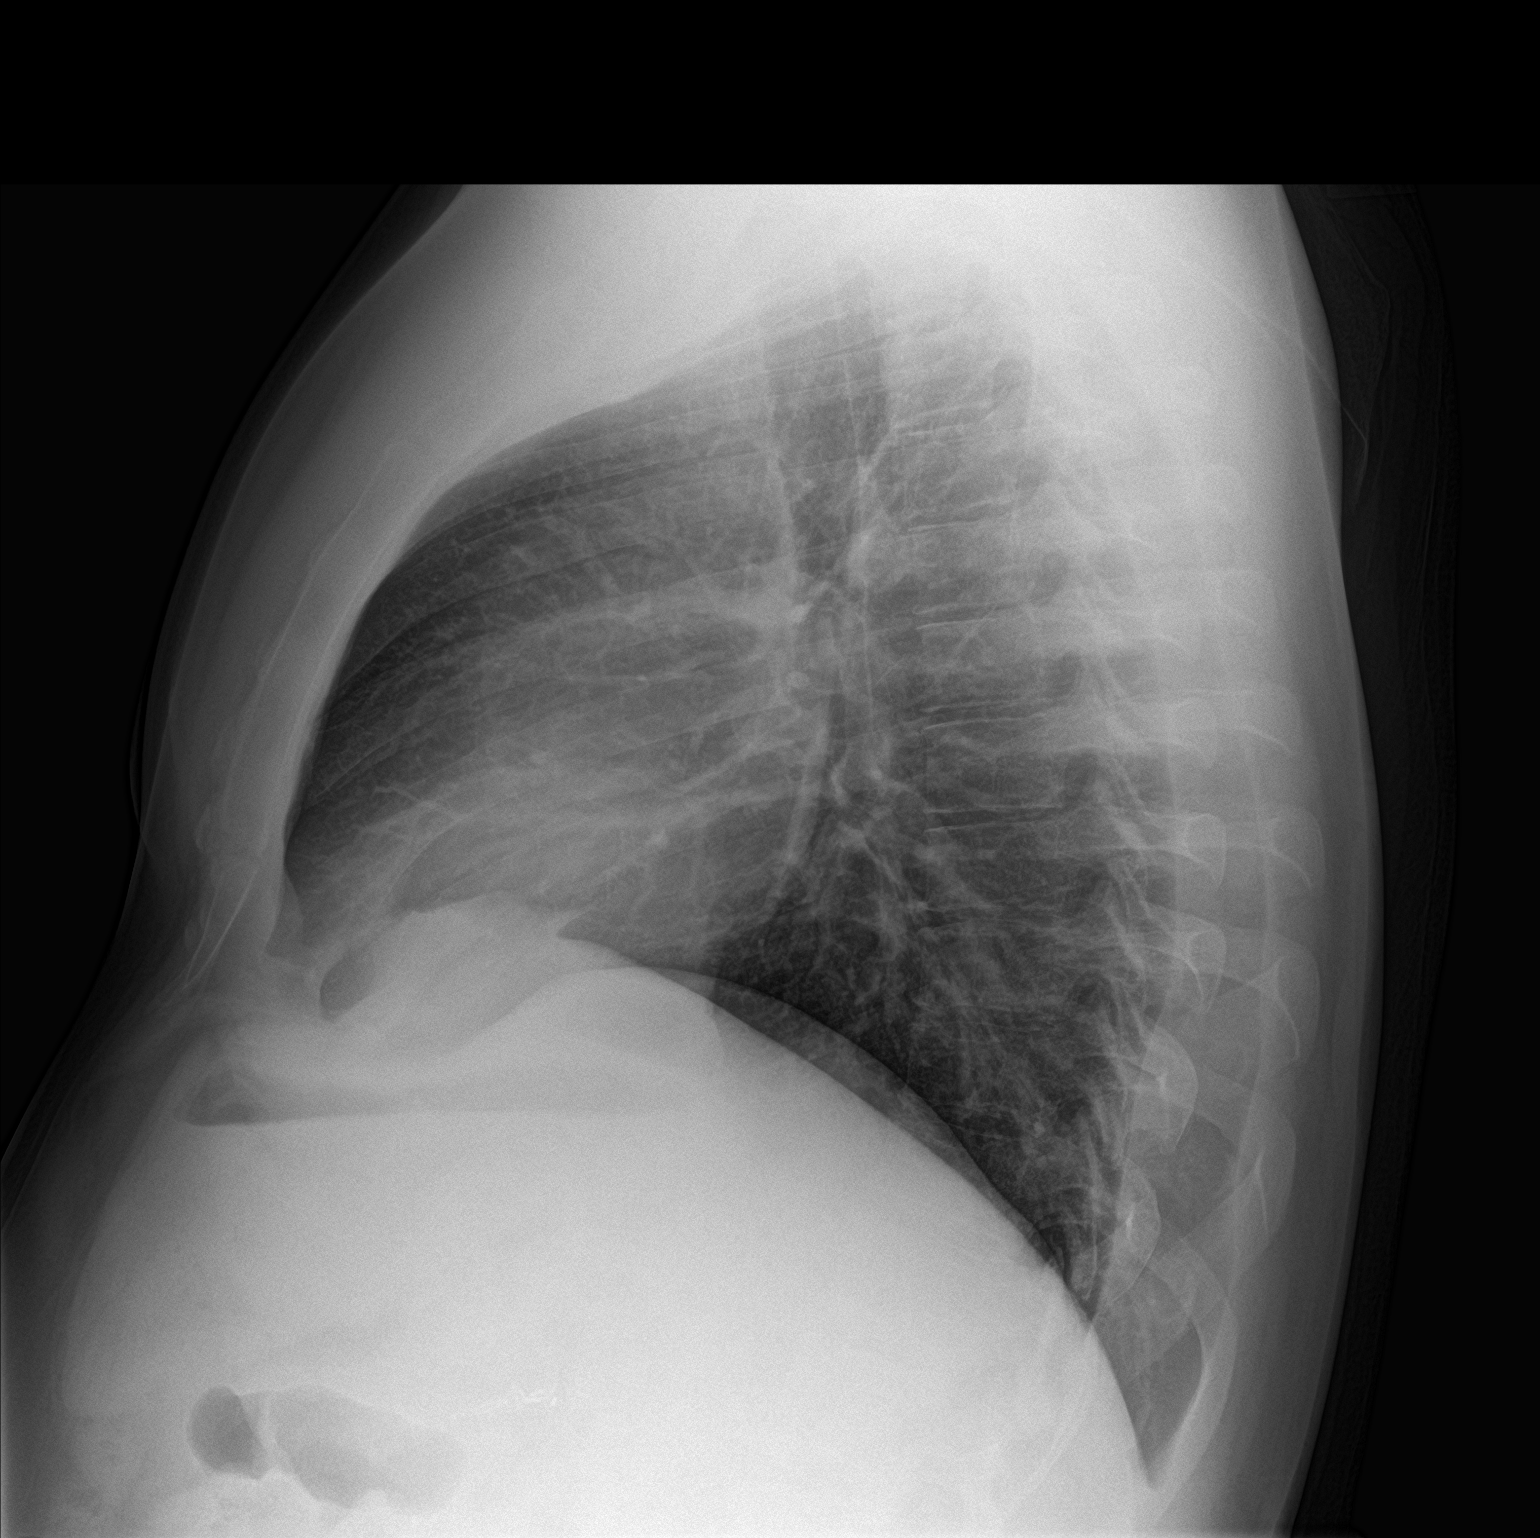

[2 of 2 positions shown; findings below may reference images not displayed]

FINDINGS: The heart size and mediastinal contours are within normal limits.
Both lungs are clear. The visualized skeletal structures are
unremarkable.

Trachea is midline. Normal heart size and vascularity. Remote
cholecystectomy.
IMPRESSION: No active cardiopulmonary disease.

## 2020-04-25 ENCOUNTER — Encounter: Payer: Self-pay | Admitting: Family Medicine

## 2020-04-25 DIAGNOSIS — M25512 Pain in left shoulder: Secondary | ICD-10-CM

## 2020-04-25 DIAGNOSIS — G8929 Other chronic pain: Secondary | ICD-10-CM

## 2020-04-25 DIAGNOSIS — M7989 Other specified soft tissue disorders: Secondary | ICD-10-CM

## 2020-05-07 ENCOUNTER — Ambulatory Visit: Payer: 59 | Attending: Family Medicine | Admitting: Occupational Therapy

## 2020-05-07 ENCOUNTER — Other Ambulatory Visit: Payer: Self-pay

## 2020-05-07 ENCOUNTER — Encounter: Payer: Self-pay | Admitting: Occupational Therapy

## 2020-05-07 DIAGNOSIS — I890 Lymphedema, not elsewhere classified: Secondary | ICD-10-CM | POA: Diagnosis present

## 2020-05-07 DIAGNOSIS — R600 Localized edema: Secondary | ICD-10-CM

## 2020-05-07 NOTE — Therapy (Signed)
[facility] [address] [place], [address] Phone: [phone]   Fax:  [phone]  Occupational Therapy Evaluation  Patient Details  Name: Paul Humphrey MRN: [id number] Date of Birth: 24-Apr-1986 Referring Provider (OT): Eunice Blase   Encounter Date: 05/07/2020   OT End of Session - 05/07/20 1628    Visit Number 1    Number of Visits 4    Date for OT Re-Evaluation 06/18/20    OT Start Time 0920    OT Stop Time 1015    OT Time Calculation (min) 55 min    Activity Tolerance Patient tolerated treatment well           Past Medical History:  Diagnosis Date  . Arthritis of big toe    left  . Bulging of cervical intervertebral disc   . Chicken pox 1992  . Chronic ethmoidal sinusitis 12/2016  . Chronic maxillary sinusitis 12/2016  . Dental crowns present   . Deviated nasal septum 12/2016  . Family history of adverse reaction to anesthesia    pt's mother has hx. of being hard to wake up post-op  . Lip laceration 12/28/2016   no stitches required, per pt.  . 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 Sinus headache     Past Surgical History:  Procedure Laterality Date  . CHOLECYSTECTOMY  2016  . COLECTOMY  2013  . COLOSTOMY  2013  . COLOSTOMY REVERSAL  2014  . LEG SURGERY Left 08/12/2005   lateral compartment release  . NASAL SEPTOPLASTY W/ TURBINOPLASTY Bilateral 01/04/2017   Procedure: NASAL SEPTOPLASTY WITH TURBINATE REDUCTION;  Surgeon: Izora Gala, MD;  Location: [facility];  Service: ENT;  Laterality: Bilateral;  . NASAL SINUS SURGERY Bilateral 01/04/2017   Procedure: ENDOSCOPIC SINUS SURGERY;  Surgeon: Izora Gala, MD;  Location: [facility];  Service: ENT;  Laterality: Bilateral;  . SHOULDER SURGERY    . UMBILICAL HERNIA REPAIR  2016   x 2  . URETERAL STENT PLACEMENT  2014    There were no vitals filed for this visit.   Subjective  Assessment - 05/07/20 1620    Subjective  I started having the swelling , pressure, discomfort and pain over upper back between scapula and then over chest after my 2nd COVID shot - I had first shot in my L thigh and 2nd one in my shoulder - it is worse in the morning  -and gets better during day - but then worse in the later day with pain -my mom has lymphedema because of breast CA -and I used her old pump and it helped for the swelling    Pertinent History Paul Humphrey is a 34 y.o. male with a hx of sinusitis who is being seen today for the evaluation of chest pain at the request of Hilts, Michael, MD.  He presents for pain in his chest as well as swelling in his chest.  He reports he had a second coronavirus vaccine on 12/04/2019.  He reports shortly after this he began to develop symptoms of what he reports as swelling in his chest wall as well as back.  He also reports a constant dull pain rated 5 out of 10 in his chest.  He reports this feels different than a muscle strain.  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  He does heavy lifting and heavy activity.  He reports he gets this  pain daily.  Report is constant.  He reports that movement and going about his day does make it better.  He reports that at the end of the day it seems to be a little worse.  Also worse in the morning.  He also reports that his chest swells and has noticed swelling in the right chest that can also move into the left chest.  This can also associate into the back.  His mother did have breast cancer and radiation therapy and has lymphedema as a complication of this.  He is concerned he possibly has lymphedema.  He has no chronic medical problems such as hypertension or hyperlipidemia.  His EKG today is extremely normal.  He denies any chest pressure chest pain with exertion.  He has no limitations of activity during the day.  He is a never smoker and rarely consumes alcohol.  Regarding surgery  he had a colectomy due to trauma.  He did not have any inflammatory bowel disease.  He has no stigmata of autoimmune diseases.  He does have a history of heart disease in his grandmothers on both side    Patient Stated Goals I just want to know what it is and if something can be done    Currently in Pain? 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 , walk some , and play computer games      Edema   Edema chest circumference above nipples 127 cm       AROM   Overall AROM Comments Bilateral shoulder AROM WNL in all planes , 5/5 strenght - no pain              Pt ed on using his mom old pump - bilateral 2 x day for 45 min - take pre and post measurements  3 days  Then  2 days only L and take pre and post measurements  And then 2 days R side and take pre and post measurements  And then during pump use - and doing chest area - pt to do AI MLD side of pump - and prior to pump and end of pump - stimulate cervical and axillary ln - and inguinal ln   Check into getting bilateral post mastectomy jovipak breast pad -to wear during day and night time under tight shirt  To facilitate lymph flow to inguinal ln               OT Education - 05/07/20 1627    Education Details findings and homeprogram    Person(s) Educated Patient    Methods Explanation;Demonstration;Tactile cues;Verbal cues;Handout    Comprehension Verbal cues required;Returned demonstration;Verbalized understanding               OT Long Term Goals - 05/07/20 1636      OT LONG TERM GOAL #1   Title Pt to report decrease circumference in  chest by 2 cm with doing HEP    Baseline no knowledge -  circumference of chest - breathing out 127 cm    Time 2    Period Weeks    Status New    Target Date 05/21/20      OT LONG TERM GOAL #2   Title Pt to be independent in HEP of compression garment pump , MLD to decrease symptoms of swelling , pressure , pain in upper thoracic    Baseline pain can increase to 5/10 - and worse in the am and late afternoon    Time 6    Period Weeks    Status New    Target Date 06/18/20                 Plan - 05/07/20 1628    Clinical Impression Statement Pt present this date with report of upper thoracic edema/lymphedema symptoms- per pt seen onset after his 2nd COVID shot in April - it comes and goes with morning the worse - bilateral same - hard to see for this OT because of bilateral and do not have prior visual - did take circumference of chest - pt to use his mom old pump that has thoracic part - and use it in conjuction with MLD massage down AI during pump to move fluid to inguinal lymphnodes - pt to take pre and post pump use measurements - and compare meausuremenst over week for me and keep record - also can use bilateral post mastectomy jovipak breast pad that will facilitate lympflow out of chest to inguinal ln  - there are reports that COVID shot affects with lymphnodes - but temporary  and pt has history of umbilical hernia repair, colostomy and reverse colostomy and galbladder - but denies any symptoms in LE for lymphedema - will reassess pt in week    OT Occupational Profile and History Problem Focused Assessment - Including review of records relating to presenting problem    Occupational performance deficits (Please refer to evaluation for details): Rest and Sleep;Work;Social Participation    Body Structure / Function / Physical Skills Edema;Pain    Rehab Potential Fair    Clinical Decision Making Limited treatment options, no task modification necessary    Comorbidities Affecting  Occupational Performance: None    Modification or Assistance to Complete Evaluation  No modification of tasks or assist necessary to complete eval    OT Frequency --   1 x wk for 2 wks and then biweekly for 4 wks   OT Duration 6 weeks    OT Treatment/Interventions Self-care/ADL training;Patient/family education;Manual lymph drainage    Plan assess progress with HEP the past week    Consulted and Agree with Plan of Care Patient           Patient will benefit from skilled therapeutic intervention in order to improve the following deficits and impairments:   Body Structure / Function / Physical Skills: Edema, Pain       Visit Diagnosis: Localized edema  Lymphedema, not elsewhere classified    Problem List Patient Active Problem List   Diagnosis Date Noted  . Class 1 obesity due to excess calories without serious comorbidity with body mass index (BMI) of 33.0 to 33.9 in adult 05/23/2019  . Other specified postprocedural states 11/02/2017  . Impingement syndrome of shoulder region 09/14/2017  . Chronic ethmoidal sinusitis 11/24/2016  . Chronic maxillary sinusitis 11/24/2016  . Deviated septum 11/24/2016  . Pain in both hands 11/23/2016  . Great toe pain, left 11/23/2016  . GERD (gastroesophageal reflux disease) 03/18/2015  .  Abdominal pain 01/07/2015  . Tingling 01/07/2015  . Suspected exposure to heavy metals 01/07/2015  . Vitamin D deficiency 01/07/2015  . Epigastric pain 10/04/2014    Rosalyn Gess  OTR/L,CLT 05/07/2020, 4:38 PM  [facility] [address], [place], [address] Phone: [phone]   Fax:  [phone]  Name: Paul Humphrey MRN: [id number] Date of Birth: 11-24-1985

## 2020-05-14 ENCOUNTER — Ambulatory Visit: Payer: 59 | Admitting: Occupational Therapy

## 2020-05-14 ENCOUNTER — Other Ambulatory Visit: Payer: Self-pay

## 2020-05-14 DIAGNOSIS — I890 Lymphedema, not elsewhere classified: Secondary | ICD-10-CM

## 2020-05-14 DIAGNOSIS — R600 Localized edema: Secondary | ICD-10-CM | POA: Diagnosis not present

## 2020-05-14 NOTE — Therapy (Signed)
[facility] [address], [place], [address] Phone: [phone]   Fax:  [phone]  Occupational Therapy Treatment  Patient Details  Name: Paul Humphrey MRN: [id number] Date of Birth: 06-15-1986 Referring Provider (OT): Eunice Blase   Encounter Date: 05/14/2020   OT End of Session - 05/14/20 1447    Visit Number 2    Number of Visits 4    Date for OT Re-Evaluation 06/18/20    OT Start Time 1345    OT Stop Time 1412    OT Time Calculation (min) 27 min    Activity Tolerance Patient tolerated treatment well    Behavior During Therapy [facility] for tasks assessed/performed           Past Medical History:  Diagnosis Date  . Arthritis of big toe    left  . Bulging of cervical intervertebral disc   . Chicken pox 1992  . Chronic ethmoidal sinusitis 12/2016  . Chronic maxillary sinusitis 12/2016  . Dental crowns present   . Deviated nasal septum 12/2016  . Family history of adverse reaction to anesthesia    pt's mother has hx. of being hard to wake up post-op  . Lip laceration 12/28/2016   no stitches required, per pt.  . 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 Sinus headache     Past Surgical History:  Procedure Laterality Date  . CHOLECYSTECTOMY  2016  . COLECTOMY  2013  . COLOSTOMY  2013  . COLOSTOMY REVERSAL  2014  . LEG SURGERY Left 08/12/2005   lateral compartment release  . NASAL SEPTOPLASTY W/ TURBINOPLASTY Bilateral 01/04/2017   Procedure: NASAL SEPTOPLASTY WITH TURBINATE REDUCTION;  Surgeon: Izora Gala, MD;  Location: [facility];  Service: ENT;  Laterality: Bilateral;  . NASAL SINUS SURGERY Bilateral 01/04/2017   Procedure: ENDOSCOPIC SINUS SURGERY;  Surgeon: Izora Gala, MD;  Location: [facility];  Service: ENT;  Laterality: Bilateral;  . SHOULDER SURGERY    . UMBILICAL HERNIA REPAIR  2016   x 2  . URETERAL STENT PLACEMENT  2014     There were no vitals filed for this visit.   Subjective Assessment - 05/14/20 1445    Subjective  I used my mom's 72 year old lymphedema pump - so the chest pieces was to small, but I did not feel every day symptoms as before the daily use of pump - where pain before and discomfort in chest was 5/10 - it will be now 3 at the worse but mostly 1-2/10 - had several days of more relieve this past week    Pertinent History Paul Humphrey is a 34 y.o. male with a hx of sinusitis who is being seen today for the evaluation of chest pain at the request of Hilts, Michael, MD.  He presents for pain in his chest as well as swelling in his chest.  He reports he had a second coronavirus vaccine on 12/04/2019.  He reports shortly after this he began to develop symptoms of what he reports as swelling in his chest wall as well as back.  He also reports a constant dull pain rated 5 out of 10 in his chest.  He reports this feels different than a muscle strain.  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  He does heavy lifting and heavy activity.  He reports he gets this pain daily.  Report is constant.  He  reports that movement and going about his day does make it better.  He reports that at the end of the day it seems to be a little worse.  Also worse in the morning.  He also reports that his chest swells and has noticed swelling in the right chest that can also move into the left chest.  This can also associate into the back.  His mother did have breast cancer and radiation therapy and has lymphedema as a complication of this.  He is concerned he possibly has lymphedema.  He has no chronic medical problems such as hypertension or hyperlipidemia.  His EKG today is extremely normal.  He denies any chest pressure chest pain with exertion.  He has no limitations of activity during the day.  He is a never smoker and rarely consumes alcohol.  Regarding surgery he had a colectomy due to trauma.   He did not have any inflammatory bowel disease.  He has no stigmata of autoimmune diseases.  He does have a history of heart disease in his grandmothers on both side    Patient Stated Goals I just want to know what it is and if something can be done    Currently in Pain? Yes    Pain Score 1     Pain Location Chest    Pain Orientation Upper    Pain Descriptors / Indicators Discomfort    Pain Type Acute pain            Pt's chest circumference measurement above nipple - 128 cm and then sternum to spine on either side 64 cm  Did appear to have below scapula on R side of spine - enlarge - but pt is R hand dominant - can be muscle bulk  Pt do report he did daily pump use - mom's old pump -with chest piece that was to small  Had less symptoms of fullness, discomfort , pain and pressure - at the most 3/10 - where prior to this past week will increase to 5/10 - and will stay in bed those days  feeling is mostly under and in between scapulas  Had more better days this week - use the pump more preventative - before used it when he did not feel to good - was to late At night time and morning worse - pt to get measure and pick upjovipak bilateral post mastectomy pad - to use night time and as needed during day - under shirt that has some compression - to facilitate lymph flow to inguinal ln  Contacted Rep at [employer] for loaner pump with chest piece that fit pt better - to use for week and take pre and post measurements - am and pm  Use 3 days alternate L and R , 2 days each L and then R 2 days  Will follow up in week to 2 wks                        OT Education - 05/14/20 1447    Education Details pump use and jovipak use    Person(s) Educated Patient    Methods Explanation;Demonstration;Tactile cues;Verbal cues;Handout    Comprehension Verbal cues required;Returned demonstration;Verbalized understanding               OT Long Term Goals - 05/07/20 1636      OT  LONG TERM GOAL #1   Title Pt to report decrease circumference in chest by 2 cm  with doing HEP    Baseline no knowledge - circumference of chest - breathing out 127 cm    Time 2    Period Weeks    Status New    Target Date 05/21/20      OT LONG TERM GOAL #2   Title Pt to be independent in HEP of compression garment pump , MLD to decrease symptoms of swelling , pressure , pain in upper thoracic    Baseline pain can increase to 5/10 - and worse in the am and late afternoon    Time 6    Period Weeks    Status New    Target Date 06/18/20                 Plan - 05/14/20 1448    Clinical Impression Statement Pt do report with use of his mom lymphedema pump - with smaller chest piece - he did get daily relieve of symptoms - stayed less than 3/10 - where before will increase to 5/10 or more and would have stayed in bed those days- had more better days - recommend for pt to get bilateral jovipak post mastectomy pad to use to facilitate lymph to inguinal ln - out of chest - and then Rep if he has loaner pump for pt to use for week with appropriate fitting chest piece and take pre and post measurements    OT Occupational Profile and History Problem Focused Assessment - Including review of records relating to presenting problem    Occupational performance deficits (Please refer to evaluation for details): Rest and Sleep;Work;Social Participation    Body Structure / Function / Physical Skills Edema;Pain    Rehab Potential Fair    Clinical Decision Making Limited treatment options, no task modification necessary    Comorbidities Affecting Occupational Performance: None    Modification or Assistance to Complete Evaluation  No modification of tasks or assist necessary to complete eval    OT Frequency 1x / week    OT Duration --   5 wks   OT Treatment/Interventions Self-care/ADL training;Patient/family education;Manual lymph drainage    Plan assess progress with HEP the past week    Consulted and  Agree with Plan of Care Patient           Patient will benefit from skilled therapeutic intervention in order to improve the following deficits and impairments:   Body Structure / Function / Physical Skills: Edema, Pain       Visit Diagnosis: Localized edema  Lymphedema, not elsewhere classified    Problem List Patient Active Problem List   Diagnosis Date Noted  . Class 1 obesity due to excess calories without serious comorbidity with body mass index (BMI) of 33.0 to 33.9 in adult 05/23/2019  . Other specified postprocedural states 11/02/2017  . Impingement syndrome of shoulder region 09/14/2017  . Chronic ethmoidal sinusitis 11/24/2016  . Chronic maxillary sinusitis 11/24/2016  . Deviated septum 11/24/2016  . Pain in both hands 11/23/2016  . Great toe pain, left 11/23/2016  . GERD (gastroesophageal reflux disease) 03/18/2015  . Abdominal pain 01/07/2015  . Tingling 01/07/2015  . Suspected exposure to heavy metals 01/07/2015  . Vitamin D deficiency 01/07/2015  . Epigastric pain 10/04/2014    Rosalyn Gess OTR/L,CLT 05/14/2020, 2:53 PM  [facility] [address] [place], [address] Phone: [phone]   Fax:  [phone]  Name: Paul Humphrey MRN: [id number] Date of Birth: 1985/11/11

## 2020-05-30 ENCOUNTER — Ambulatory Visit: Payer: 59 | Attending: Family Medicine | Admitting: Occupational Therapy

## 2020-05-30 ENCOUNTER — Other Ambulatory Visit: Payer: Self-pay

## 2020-05-30 DIAGNOSIS — R600 Localized edema: Secondary | ICD-10-CM | POA: Insufficient documentation

## 2020-05-30 DIAGNOSIS — I890 Lymphedema, not elsewhere classified: Secondary | ICD-10-CM | POA: Insufficient documentation

## 2020-05-30 NOTE — Therapy (Signed)
[place] [facility] [address] [place], [address] Phone: [phone]   Fax:  [phone]  Occupational Therapy Treatment  Patient Details  Name: Paul Humphrey MRN: [id number] Date of Birth: [birth year] Referring Provider (OT): Eunice Blase   Encounter Date: 05/30/2020   OT End of Session - 05/30/20 1658    Visit Number 3    Number of Visits 4    Date for OT Re-Evaluation 06/18/20    OT Start Time 1440    OT Stop Time 1522    OT Time Calculation (min) 42 min    Activity Tolerance Patient tolerated treatment well    Behavior During Therapy [facility] for tasks assessed/performed           Past Medical History:  Diagnosis Date  . Arthritis of big toe    left  . Bulging of cervical intervertebral disc   . Chicken pox 1992  . Chronic ethmoidal sinusitis 12/2016  . Chronic maxillary sinusitis 12/2016  . Dental crowns present   . Deviated nasal septum 12/2016  . Family history of adverse reaction to anesthesia    pt's mother has hx. of being hard to wake up post-op  . Lip laceration 12/28/2016   no stitches required, per pt.  . 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 Sinus headache     Past Surgical History:  Procedure Laterality Date  . CHOLECYSTECTOMY  2016  . COLECTOMY  2013  . COLOSTOMY  2013  . COLOSTOMY REVERSAL  2014  . LEG SURGERY Left 08/12/2005   lateral compartment release  . NASAL SEPTOPLASTY W/ TURBINOPLASTY Bilateral 01/04/2017   Procedure: NASAL SEPTOPLASTY WITH TURBINATE REDUCTION;  Surgeon: Izora Gala, MD;  Location: [place];  Service: ENT;  Laterality: Bilateral;  . NASAL SINUS SURGERY Bilateral 01/04/2017   Procedure: ENDOSCOPIC SINUS SURGERY;  Surgeon: Izora Gala, MD;  Location: [place];  Service: ENT;  Laterality: Bilateral;  . SHOULDER SURGERY    . UMBILICAL HERNIA REPAIR  2016   x 2  . URETERAL STENT PLACEMENT  2014     There were no vitals filed for this visit.   Subjective Assessment - 05/30/20 1655    Subjective  I did not use one week the pump of my mom -that is to small at chest - but pain and pressure increase to 5/10 or more and had to stay in bed one day - Then I used her pump 2 x day am and pm for week or 2 -and I do feel that with the alternating sides due to the nature of the temp pump garment the Left side of my body seems to hold fluid longer, or rather is more sensitive to shifts and pain levels than the right side - but it did decrease my chest circumference after using pump fo 2-3 days and over night it does increase , and during day - decrease back after pump in am and pm    Pertinent History Autrey Human is a 34 y.o. male with a hx of sinusitis who is being seen today for the evaluation of chest pain at the request of Hilts, Michael, MD.  He presents for pain in his chest as well as swelling in his chest.  He reports he had a second coronavirus vaccine on 12/04/2019.  He reports shortly after this he began to develop symptoms of what he reports as swelling in his chest wall as well as  back.  He also reports a constant dull pain rated 5 out of 10 in his chest.  He reports this feels different than a muscle strain.  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  He does heavy lifting and heavy activity.  He reports he gets this pain daily.  Report is constant.  He reports that movement and going about his day does make it better.  He reports that at the end of the day it seems to be a little worse.  Also worse in the morning.  He also reports that his chest swells and has noticed swelling in the right chest that can also move into the left chest.  This can also associate into the back.  His mother did have breast cancer and radiation therapy and has lymphedema as a complication of this.  He is concerned he possibly has lymphedema.  He has no chronic medical problems such  as hypertension or hyperlipidemia.  His EKG today is extremely normal.  He denies any chest pressure chest pain with exertion.  He has no limitations of activity during the day.  He is a never smoker and rarely consumes alcohol.  Regarding surgery he had a colectomy due to trauma.  He did not have any inflammatory bowel disease.  He has no stigmata of autoimmune diseases.  He does have a history of heart disease in his grandmothers on both side    Patient Stated Goals I just want to know what it is and if something can be done    Pain Location Chest    Pain Orientation Upper    Pain Descriptors / Indicators Discomfort    Pain Type Acute pain               LYMPHEDEMA/ONCOLOGY QUESTIONNAIRE - 05/30/20 0001      Right Upper Extremity Lymphedema   15 cm Proximal to Olecranon Process 35.5 cm    10 cm Proximal to Olecranon Process 33 cm    Olecranon Process 31.5 cm    15 cm Proximal to Ulnar Styloid Process 28 cm    10 cm Proximal to Ulnar Styloid Process 23.5 cm    Just Proximal to Ulnar Styloid Process 18 cm    Across Hand at [employer] 24 cm      Left Upper Extremity Lymphedema   15 cm Proximal to Olecranon Process 34 cm    10 cm Proximal to Olecranon Process 31 cm    Olecranon Process 30.5 cm    15 cm Proximal to Ulnar Styloid Process 27.5 cm    10 cm Proximal to Ulnar Styloid Process 22 cm    Just Proximal to Ulnar Styloid Process 17.5 cm    Across Hand at [employer] 22 cm          Baseline last time for circumference of chest with some pump use - 128 cm   Pt return after 2 1/2 wks  Pt did not use his mom old pump for about first 7 days  Circumference increase in chest to 131 cm - and increase pain and pressure to 5-6/10  Deeper breathes feel more labored, and add to discomfort level. Pressure in neck is greater and spine (neck) is popping when looking up and down. Also having agitated nerves in neck and some tingling that comes and goes in hands. Feeling pressure  in lower jaw as well.  Then started pump am and pm for week  With MLD -the first 2 days  Still had some pain and pressure /discomfort in L chest more than R -but decrease with MLD  And  pump  After 3 days pain decrease to 3-5/10 and circumference decrease And then after 5 days - pain decrease and discomfort to 1-2/10 to no pain only discomfort if done a lot of physical work And circumference decrease to 127 cm  It does increase over night or during day to 128 but then after pump decrease to 127  Pt to use pump for next 2 wks - and measure opposite arm - to make sure opposite arm do no increase with pump use on opposite size- that will determent if we need bilateral pump Cont with am and pm pump - using mom old pump - smaller chest piece  And when he gets his bilateral post mastectomy jovipak breast pad -to use night time to prevent increase of fluid in chest and upper back during night   Reassess in 2-3 wks                   OT Education - 05/30/20 1658    Education Details pump use and jovipak use    Person(s) Educated Patient    Methods Explanation;Demonstration;Tactile cues;Verbal cues;Handout    Comprehension Verbal cues required;Returned demonstration;Verbalized understanding               OT Long Term Goals - 05/07/20 1636      OT LONG TERM GOAL #1   Title Pt to report decrease circumference in chest by 2 cm with doing HEP    Baseline no knowledge - circumference of chest - breathing out 127 cm    Time 2    Period Weeks    Status New    Target Date 05/21/20      OT LONG TERM GOAL #2   Title Pt to be independent in HEP of compression garment pump , MLD to decrease symptoms of swelling , pressure , pain in upper thoracic    Baseline pain can increase to 5/10 - and worse in the am and late afternoon    Time 6    Period Weeks    Status New    Target Date 06/18/20                 Plan - 05/30/20 1659    Clinical Impression Statement Pt this  past 2 wks did not use him mom's old pump - and had increase discomfort over chest and upper back between scapulas, and chest circumference was upt to 13.5 - had to stay in bed one day -and increas stabbing pain on L chest and around side. Then used pump am and pm daily for week - after 2-3 days had relieve from pain and discomfort - increase in am after night or after day work - increase to 128 and then decrease after pump to 127 cm - pt also doing MLD from bilateral AI during pump use - pt to use pump same way - but wear jovipak at night time - and then measure opposite size of arm to see if lymph or fluid is moved to opposite upper quadrant - will follow up in 2 wksagain    OT Occupational Profile and History Problem Focused Assessment - Including review of records relating to presenting problem    Occupational performance deficits (Please refer to evaluation for details): Rest and Sleep;Work;Social Participation    Body Structure / Function /  Physical Skills Edema;Pain    Rehab Potential Fair    Clinical Decision Making Limited treatment options, no task modification necessary    Comorbidities Affecting Occupational Performance: None    Modification or Assistance to Complete Evaluation  No modification of tasks or assist necessary to complete eval    OT Frequency 1x / week    OT Duration --   3 wks   OT Treatment/Interventions Self-care/ADL training;Patient/family education;Manual lymph drainage    Plan assess progress with HEP the 2 wks    Consulted and Agree with Plan of Care Patient           Patient will benefit from skilled therapeutic intervention in order to improve the following deficits and impairments:   Body Structure / Function / Physical Skills: Edema, Pain       Visit Diagnosis: Localized edema  Lymphedema, not elsewhere classified    Problem List Patient Active Problem List   Diagnosis Date Noted  . Class 1 obesity due to excess calories without serious  comorbidity with body mass index (BMI) of 33.0 to 33.9 in adult 05/23/2019  . Other specified postprocedural states 11/02/2017  . Impingement syndrome of shoulder region 09/14/2017  . Chronic ethmoidal sinusitis 11/24/2016  . Chronic maxillary sinusitis 11/24/2016  . Deviated septum 11/24/2016  . Pain in both hands 11/23/2016  . Great toe pain, left 11/23/2016  . GERD (gastroesophageal reflux disease) 03/18/2015  . Abdominal pain 01/07/2015  . Tingling 01/07/2015  . Suspected exposure to heavy metals 01/07/2015  . Vitamin D deficiency 01/07/2015  . Epigastric pain 10/04/2014    Rosalyn Gess OTR/L,CLT  05/30/2020, 5:09 PM  [place] [facility] [address], [place], [address] Phone: [phone]   Fax:  [phone]  Name: Amedio Bowlby MRN: [id number] Date of Birth: February 04, 1986

## 2020-06-18 ENCOUNTER — Other Ambulatory Visit: Payer: Self-pay

## 2020-06-18 ENCOUNTER — Ambulatory Visit: Payer: 59 | Admitting: Occupational Therapy

## 2020-06-18 DIAGNOSIS — R600 Localized edema: Secondary | ICD-10-CM

## 2020-06-18 DIAGNOSIS — I890 Lymphedema, not elsewhere classified: Secondary | ICD-10-CM

## 2020-06-18 NOTE — Therapy (Signed)
[place] [facility] [address] [place], [address] Phone: [phone]   Fax:  [phone]  Occupational Therapy Treatment  Patient Details  Name: Paul Humphrey MRN: [id number] Date of Birth: 27-Jun-1986 Referring Provider (OT): Eunice Blase   Encounter Date: 06/18/2020   OT End of Session - 06/18/20 1552    Visit Number 4    Number of Visits 8    Date for OT Re-Evaluation 08/13/20    OT Start Time 1400    OT Stop Time 1440    OT Time Calculation (min) 40 min    Activity Tolerance Patient tolerated treatment well    Behavior During Therapy [facility] for tasks assessed/performed           Past Medical History:  Diagnosis Date  . Arthritis of big toe    left  . Bulging of cervical intervertebral disc   . Chicken pox 1992  . Chronic ethmoidal sinusitis 12/2016  . Chronic maxillary sinusitis 12/2016  . Dental crowns present   . Deviated nasal septum 12/2016  . Family history of adverse reaction to anesthesia    pt's mother has hx. of being hard to wake up post-op  . Lip laceration 12/28/2016   no stitches required, per pt.  . 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 Sinus headache     Past Surgical History:  Procedure Laterality Date  . CHOLECYSTECTOMY  2016  . COLECTOMY  2013  . COLOSTOMY  2013  . COLOSTOMY REVERSAL  2014  . LEG SURGERY Left 08/12/2005   lateral compartment release  . NASAL SEPTOPLASTY W/ TURBINOPLASTY Bilateral 01/04/2017   Procedure: NASAL SEPTOPLASTY WITH TURBINATE REDUCTION;  Surgeon: Izora Gala, MD;  Location: [place];  Service: ENT;  Laterality: Bilateral;  . NASAL SINUS SURGERY Bilateral 01/04/2017   Procedure: ENDOSCOPIC SINUS SURGERY;  Surgeon: Izora Gala, MD;  Location: [place];  Service: ENT;  Laterality: Bilateral;  . SHOULDER SURGERY    . UMBILICAL HERNIA REPAIR  2016   x 2  . URETERAL STENT PLACEMENT  2014     There were no vitals filed for this visit.   Subjective Assessment - 06/18/20 1546    Subjective  I did use my mom's old pump with smaller chest piece for the last 3 wks am and pm on toth sides - so it takes along time because the pump only pump one side at time-but then I took measurement like you told me of the opposite arm  - L increased but the R did not increase - my chest staying under control with pump  - and with jovipak that I wear at night time -I don't feel so tight and swollen in the am - discomfort in am now 1-2/10 only - but do feel little more tighter in my L abdominal region  below my ribcage    Pertinent History Paul Humphrey is a 34 y.o. male with a hx of sinusitis who is being seen today for the evaluation of chest pain at the request of Hilts, Michael, MD.  He presents for pain in his chest as well as swelling in his chest.  He reports he had a second coronavirus vaccine on 12/04/2019.  He reports shortly after this he began to develop symptoms of what he reports as swelling in his chest wall as well as back.  He also reports a constant dull pain rated 5 out of 10 in his  chest.  He reports this feels different than a muscle strain.  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  He does heavy lifting and heavy activity.  He reports he gets this pain daily.  Report is constant.  He reports that movement and going about his day does make it better.  He reports that at the end of the day it seems to be a little worse.  Also worse in the morning.  He also reports that his chest swells and has noticed swelling in the right chest that can also move into the left chest.  This can also associate into the back.  His mother did have breast cancer and radiation therapy and has lymphedema as a complication of this.  He is concerned he possibly has lymphedema.  He has no chronic medical problems such as hypertension or hyperlipidemia.  His EKG today is extremely  normal.  He denies any chest pressure chest pain with exertion.  He has no limitations of activity during the day.  He is a never smoker and rarely consumes alcohol.  Regarding surgery he had a colectomy due to trauma.  He did not have any inflammatory bowel disease.  He has no stigmata of autoimmune diseases.  He does have a history of heart disease in his grandmothers on both side    Patient Stated Goals I just want to know what it is and if something can be done    Currently in Pain? Yes    Pain Score 1     Pain Location Chest    Pain Orientation Upper    Pain Descriptors / Indicators Discomfort    Pain Type Acute pain               LYMPHEDEMA/ONCOLOGY QUESTIONNAIRE - 06/18/20 0001      Right Upper Extremity Lymphedema   15 cm Proximal to Olecranon Process 36 cm    10 cm Proximal to Olecranon Process 33.3 cm    Olecranon Process 31.8 cm      Left Upper Extremity Lymphedema   15 cm Proximal to Olecranon Process 34.5 cm    10 cm Proximal to Olecranon Process 31.5 cm    Olecranon Process 30.8 cm           On 06/09/2020:     Pt report that he did not use his mom's old pump for about first 7 days  Circumference increase in chest to 131 cm - and increase pain and pressure to 5-6/10  Deeper breathes felt more labored, and add to discomfort level. Pressure in neck was greater and spine (neck) is popping when looking up and down. Also having agitated nerves in neck and some tingling that comes and goes in hands. Feeling pressure in lower jaw as well.  Then started the pump am and pm for week  With MLD -the first 2 days  Still had some pain and pressure /discomfort in L chest more than R -but decrease with MLD  And  pump  After 3 days pain decrease to 3-5/10 and circumference decrease And then after 5 days - pain decrease and discomfort to 1-2/10 to no pain only discomfort if done a lot of physical work And circumference decrease to 127 cm  It does increase over night or during  day to 128 but then after pump decrease to 127  Instructed pt to then use pump for  2-3 wks  - and measure opposite arm - to  make sure opposite arm do no increase with pump use on opposite size- that will determent if we need bilateral pump Cont with am and pm pump - using mom old pump - smaller chest piece  He then started on 06/07/2020 using  bilateral post mastectomy jovipak breast pad at night time to prevent increase of fluid in chest and upper back during night   This date 06/18/2020:  Pt return with some pre and post measurements - am and pm pump help still keeping his chest measurements down to 127cm range -and did increase one time back to 130 cm - but that was after having a very physical day - pt did start using bilateral post mastectomy jovipak breastpad at night time- and report feeling less discomfort in chest in the am - and discomfort level rarely increase more than 1-2/10  He did take pre and post measurement for me am and pm of opposite arm after using pump - did appear that the L upper arm increase by 1 cm after pump use on R - but R side did not increase with use on L  Pt report do feel now that L lower thoracic area below ribcage feels tighter than previous - could be that the old pump from his mom  -that his using -is not his size and chest piece is to small and not covering the thoracic like it should - and not moving lymph down to inguinal lymphnodes.  Pt do have extended history of several abdominal surgeries in past -and then since earlier this year - after he had his 2nd  COVID shot in L upper arm - he report his symptoms started -  First shot was in his L thigh  Would recommend at this stage that pt can benefit from a correct bilateral lymphedema pump to cover bilateral UE and thoracic - will discuss case with his MD and Reps for appropriate pump                  OT Education - 06/18/20 1551    Education Details pump use and jovipak use and plan     Person(s) Educated Patient    Methods Explanation;Demonstration;Tactile cues;Verbal cues;Handout    Comprehension Verbal cues required;Returned demonstration;Verbalized understanding               OT Long Term Goals - 06/18/20 1557      OT LONG TERM GOAL #1   Title Pt to report decrease circumference in chest by 2 cm with doing HEP    Baseline no knowledge - circumference of chest - breathing out 127 cm - prior to pump use consistantly - increase in am 130 to 131 - and now keeping it at 127/128 - less discomfort and pressure in am since using jovipak  post mastectomy pad    Status Achieved      OT LONG TERM GOAL #2   Title Pt to be independent in HEP of compression garment pump , MLD to decrease symptoms of swelling , pressure , pain in upper thoracic    Baseline pain can increase to 5/10 - and worse in the am and late afternoon - decrease to be able to maintain 1-2/10 at  except when doing very hard work for 4-6 hrs - then can increase to 3-4/10 at the worse    Status Achieved                 Plan - 06/18/20 1600    Clinical  Impression Statement Pt cont to used his mom's old flexitouch lymphatic pump the last 2-3 wks - with use of bilateral post mastectomy jovipak breast pad the last week - pt cont to show decrease pressure , pain, discomfort and measurements with use of pump in the am - now only 1-2/10 - and most after very hard day at work maybe 3-4/10 at the worse- with use of unilateral pump it do appear that the L arm do want to increase post pump in circumference - where the R side did not - it do appear that pt has lymphatic component that was compromised since earlier this year - and with his extended history of abdominal surgeries  his deep abdominal lymphatics is compromise - appear pt has bilateral thoracic lymphedema with L worse than R - and he could benefit from bilateral  UE and thoracic lymphatic pump - pt showed since 4 visits ago - decrease discomfort , pain and  circumference in chest/upper back - and less days in bed and increase quality of life    OT Occupational Profile and History Problem Focused Assessment - Including review of records relating to presenting problem    Occupational performance deficits (Please refer to evaluation for details): Rest and Sleep;Work;Social Participation    Body Structure / Function / Physical Skills Edema;Pain    Rehab Potential Fair    Clinical Decision Making Limited treatment options, no task modification necessary    Comorbidities Affecting Occupational Performance: None    Modification or Assistance to Complete Evaluation  No modification of tasks or assist necessary to complete eval    OT Frequency Biweekly    OT Duration 8 weeks    OT Treatment/Interventions Self-care/ADL training;Patient/family education;Manual lymph drainage    Plan discuss case with MD , Reps for pump options and insurance coverage    Consulted and Agree with Plan of Care Patient           Patient will benefit from skilled therapeutic intervention in order to improve the following deficits and impairments:   Body Structure / Function / Physical Skills: Edema, Pain       Visit Diagnosis: Localized edema - Plan: Ot plan of care cert/re-cert  Lymphedema, not elsewhere classified - Plan: Ot plan of care cert/re-cert    Problem List Patient Active Problem List   Diagnosis Date Noted  . Class 1 obesity due to excess calories without serious comorbidity with body mass index (BMI) of 33.0 to 33.9 in adult 05/23/2019  . Other specified postprocedural states 11/02/2017  . Impingement syndrome of shoulder region 09/14/2017  . Chronic ethmoidal sinusitis 11/24/2016  . Chronic maxillary sinusitis 11/24/2016  . Deviated septum 11/24/2016  . Pain in both hands 11/23/2016  . Great toe pain, left 11/23/2016  . GERD (gastroesophageal reflux disease) 03/18/2015  . Abdominal pain 01/07/2015  . Tingling 01/07/2015  . Suspected exposure to  heavy metals 01/07/2015  . Vitamin D deficiency 01/07/2015  . Epigastric pain 10/04/2014    Rosalyn Gess OTR/L,CLT  06/18/2020, 5:46 PM  [place] [place] [facility] [address], [place], [address] Phone: [phone]   Fax:  [phone]  Name: Dejaun Vidrio MRN: [id number] Date of Birth: 09-10-1985

## 2020-06-27 ENCOUNTER — Telehealth: Payer: Self-pay | Admitting: Family Medicine

## 2020-06-27 NOTE — Telephone Encounter (Signed)
Paul Humphrey with [employer] needs a RX signed its a numatic compression pump to help with swelling.   Paul Humphrey also said she can send it via email on [employer]

## 2020-06-27 NOTE — Telephone Encounter (Signed)
I will fax back today

## 2020-06-27 NOTE — Telephone Encounter (Signed)
I put this in the "fax back" stack at the desk behind you. Would you mind sending this back to the sender?

## 2020-06-28 NOTE — Therapy (Signed)
[facility] [address], [place], [address] Phone: [phone]   Fax:  [phone]  Occupational Therapy addendum to last note   Patient Details  Name: Paul Humphrey MRN: [id number] Date of Birth: [birth year] Referring Provider (OT): Hilts, Legrand Como   Encounter Date: 06/18/2020 - update note since used basic pump and had pre and post measurements     Past Medical History:  Diagnosis Date  . Arthritis of big toe    left  . Bulging of cervical intervertebral disc   . Chicken pox 1992  . Chronic ethmoidal sinusitis 12/2016  . Chronic maxillary sinusitis 12/2016  . Dental crowns present   . Deviated nasal septum 12/2016  . Family history of adverse reaction to anesthesia    pt's mother has hx. of being hard to wake up post-op  . Lip laceration 12/28/2016   no stitches required, per pt.  . 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 Sinus headache     Past Surgical History:  Procedure Laterality Date  . CHOLECYSTECTOMY  2016  . COLECTOMY  2013  . COLOSTOMY  2013  . COLOSTOMY REVERSAL  2014  . LEG SURGERY Left 08/12/2005   lateral compartment release  . NASAL SEPTOPLASTY W/ TURBINOPLASTY Bilateral 01/04/2017   Procedure: NASAL SEPTOPLASTY WITH TURBINATE REDUCTION;  Surgeon: Izora Gala, MD;  Location: [facility];  Service: ENT;  Laterality: Bilateral;  . NASAL SINUS SURGERY Bilateral 01/04/2017   Procedure: ENDOSCOPIC SINUS SURGERY;  Surgeon: Izora Gala, MD;  Location: [facility];  Service: ENT;  Laterality: Bilateral;  . SHOULDER SURGERY    . UMBILICAL HERNIA REPAIR  2016   x 2  . URETERAL STENT PLACEMENT  2014    There were no vitals filed for this visit.           Pt was fitted and used basic pump 06/25/20  - pre and post measurements were taken  And showed increase proximal circumference - measurements available if needed  Pt has increase   symptoms of lymphedema since earlier this year and with the proliferation of adipose cells in the chest, under arm and L abdominal region, the area appears hyperplastic.  Pt would greatly benefit from Flexitouch pump that clears thoracic and abdominal area to prevent increase of symptoms  This pump does the closes simulation of  MLD - and with pt that works full time and do not have help at home - will increase his quality of life -decrease pressure, discomfort and pain symptoms.                    OT Long Term Goals - 06/18/20 1557      OT LONG TERM GOAL #1   Title Pt to report decrease circumference in chest by 2 cm with doing HEP    Baseline no knowledge - circumference of chest - breathing out 127 cm - prior to pump use consistantly - increase in am 130 to 131 - and now keeping it at 127/128 - less discomfort and pressure in am since using jovipak  post mastectomy pad    Status Achieved      OT LONG TERM GOAL #2   Title Pt to be independent in HEP of compression garment pump , MLD to decrease symptoms of swelling , pressure , pain in upper thoracic    Baseline pain can increase to 5/10 - and worse in the am and  late afternoon - decrease to be able to maintain 1-2/10 at  except when doing very hard work for 4-6 hrs - then can increase to 3-4/10 at the worse    Status Achieved                  Patient will benefit from skilled therapeutic intervention in order to improve the following deficits and impairments:   Body Structure / Function / Physical Skills: Edema, Pain       Visit Diagnosis: Localized edema - Plan: Ot plan of care cert/re-cert  Lymphedema, not elsewhere classified - Plan: Ot plan of care cert/re-cert    Problem List Patient Active Problem List   Diagnosis Date Noted  . Class 1 obesity due to excess calories without serious comorbidity with body mass index (BMI) of 33.0 to 33.9 in adult 05/23/2019  . Other specified postprocedural states  11/02/2017  . Impingement syndrome of shoulder region 09/14/2017  . Chronic ethmoidal sinusitis 11/24/2016  . Chronic maxillary sinusitis 11/24/2016  . Deviated septum 11/24/2016  . Pain in both hands 11/23/2016  . Great toe pain, left 11/23/2016  . GERD (gastroesophageal reflux disease) 03/18/2015  . Abdominal pain 01/07/2015  . Tingling 01/07/2015  . Suspected exposure to heavy metals 01/07/2015  . Vitamin D deficiency 01/07/2015  . Epigastric pain 10/04/2014    Rosalyn Gess OTR/L,CLT 06/28/2020, 12:09 PM  [facility] [address] [place], [address] Phone: [phone]   Fax:  [phone]  Name: Elwyn Klosinski MRN: [id number] Date of Birth: July 10, 1986

## 2020-07-16 ENCOUNTER — Emergency Department (HOSPITAL_COMMUNITY): Payer: 59

## 2020-07-16 ENCOUNTER — Emergency Department (HOSPITAL_COMMUNITY)
Admission: EM | Admit: 2020-07-16 | Discharge: 2020-07-16 | Disposition: A | Discharge status: Home / Self Care | Payer: 59 | Attending: Emergency Medicine | Admitting: Emergency Medicine

## 2020-07-16 DIAGNOSIS — K219 Gastro-esophageal reflux disease without esophagitis: Secondary | ICD-10-CM | POA: Diagnosis not present

## 2020-07-16 DIAGNOSIS — N200 Calculus of kidney: Secondary | ICD-10-CM | POA: Insufficient documentation

## 2020-07-16 DIAGNOSIS — R109 Unspecified abdominal pain: Secondary | ICD-10-CM | POA: Diagnosis present

## 2020-07-16 LAB — CBC WITH DIFFERENTIAL/PLATELET
Abs Immature Granulocytes: 0.02 10*3/uL (ref 0.00–0.07)
Basophils Absolute: 0 10*3/uL (ref 0.0–0.1)
Basophils Relative: 1 %
Eosinophils Absolute: 0.2 10*3/uL (ref 0.0–0.5)
Eosinophils Relative: 3 %
HCT: 47.8 % (ref 39.0–52.0)
Hemoglobin: 16.2 g/dL (ref 13.0–17.0)
Immature Granulocytes: 0 %
Lymphocytes Relative: 30 %
Lymphs Abs: 2 10*3/uL (ref 0.7–4.0)
MCH: 29.9 pg (ref 26.0–34.0)
MCHC: 33.9 g/dL (ref 30.0–36.0)
MCV: 88.4 fL (ref 80.0–100.0)
Monocytes Absolute: 0.6 10*3/uL (ref 0.1–1.0)
Monocytes Relative: 9 %
Neutro Abs: 3.8 10*3/uL (ref 1.7–7.7)
Neutrophils Relative %: 57 %
Platelets: 182 10*3/uL (ref 150–400)
RBC: 5.41 MIL/uL (ref 4.22–5.81)
RDW: 12.7 % (ref 11.5–15.5)
WBC: 6.6 10*3/uL (ref 4.0–10.5)
nRBC: 0 % (ref 0.0–0.2)

## 2020-07-16 LAB — COMPREHENSIVE METABOLIC PANEL
ALT: 47 U/L — ABNORMAL HIGH (ref 0–44)
AST: 36 U/L (ref 15–41)
Albumin: 4.2 g/dL (ref 3.5–5.0)
Alkaline Phosphatase: 37 U/L — ABNORMAL LOW (ref 38–126)
Anion gap: 14 (ref 5–15)
BUN: 12 mg/dL (ref 6–20)
CO2: 19 mmol/L — ABNORMAL LOW (ref 22–32)
Calcium: 9 mg/dL (ref 8.9–10.3)
Chloride: 105 mmol/L (ref 98–111)
Creatinine, Ser: 1.22 mg/dL (ref 0.61–1.24)
GFR, Estimated: >60 mL/min (ref >60)
Glucose, Bld: 105 mg/dL — ABNORMAL HIGH (ref 70–99)
Potassium: 3.4 mmol/L — ABNORMAL LOW (ref 3.5–5.1)
Sodium: 138 mmol/L (ref 135–145)
Total Bilirubin: 0.7 mg/dL (ref 0.3–1.2)
Total Protein: 6.9 g/dL (ref 6.5–8.1)

## 2020-07-16 LAB — LIPASE, BLOOD: Lipase: 33 U/L (ref 11–51)

## 2020-07-16 MED ORDER — ONDANSETRON HCL 4 MG/2ML IJ SOLN
4.0000 mg | Freq: Once | INTRAMUSCULAR | Status: AC
  Administered 2020-07-16: 4 mg via INTRAVENOUS
  Filled 2020-07-16: qty 2

## 2020-07-16 MED ORDER — MORPHINE SULFATE (PF) 4 MG/ML IV SOLN
6.0000 mg | Freq: Once | INTRAVENOUS | Status: AC
  Administered 2020-07-16: 6 mg via INTRAVENOUS
  Filled 2020-07-16: qty 2

## 2020-07-16 MED ORDER — KETOROLAC TROMETHAMINE 30 MG/ML IJ SOLN
30.0000 mg | Freq: Once | INTRAMUSCULAR | Status: AC
  Administered 2020-07-16: 30 mg via INTRAVENOUS
  Filled 2020-07-16: qty 1

## 2020-07-16 NOTE — Discharge Instructions (Signed)
Your pain is likely due to a recently passed 2 mm kidney stone on the left side. You may take over-the-counter Tylenol or ibuprofen as needed for pain. Follow instruction below.

## 2020-07-16 NOTE — ED Provider Notes (Signed)
[facility] Provider Note   CSN: [id number] Arrival date & time: 07/16/20  1752     History Chief Complaint  Patient presents with  . Abdominal Pain  . Back Pain    Paul Humphrey is a 34 y.o. male.  The history is provided by the patient and medical records. No language interpreter was used.  Abdominal Pain Back Pain Associated symptoms: abdominal pain      34 year old male significant history of bulging disc in the cervical spine, arthritis, obesity brought here via EMS from home for evaluation of back pain.  Patient report he was driving home when he developed acute onset of pain to his left flank radiates towards his abdomen.  Pain is intense searing, spreading across his abdomen with associated nausea but without vomiting.  States he break out in a sweat and felt very uncomfortable.  Nothing seems to make it better but movement makes it worse.  He did report having difficulty having bowel movement yesterday.  He mention having history of right lower quadrant abdominal pain with previous trauma to the lower abdomen including impalement through the rectum requiring colostomy and takedown with reanastomosis.  He denies any prior history of kidney stone.  He denies any recent strenuous activities or heavy lifting.  No urinary complaint no hematuria.  No fever chills chest pain shortness of breath productive cough.  No complaint of legs pain swelling.  Past Medical History:  Diagnosis Date  . Arthritis of big toe    left  . Bulging of cervical intervertebral disc   . Chicken pox 1992  . Chronic ethmoidal sinusitis 12/2016  . Chronic maxillary sinusitis 12/2016  . Dental crowns present   . Deviated nasal septum 12/2016  . Family history of adverse reaction to anesthesia    pt's mother has hx. of being hard to wake up post-op  . Lip laceration 12/28/2016   no stitches required, per pt.  . 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 Sinus headache     Patient Active Problem List   Diagnosis Date Noted  . Class 1 obesity due to excess calories without serious comorbidity with body mass index (BMI) of 33.0 to 33.9 in adult 05/23/2019  . Other specified postprocedural states 11/02/2017  . Impingement syndrome of shoulder region 09/14/2017  . Chronic ethmoidal sinusitis 11/24/2016  . Chronic maxillary sinusitis 11/24/2016  . Deviated septum 11/24/2016  . Pain in both hands 11/23/2016  . Great toe pain, left 11/23/2016  . GERD (gastroesophageal reflux disease) 03/18/2015  . Abdominal pain 01/07/2015  . Tingling 01/07/2015  . Suspected exposure to heavy metals 01/07/2015  . Vitamin D deficiency 01/07/2015  . Epigastric pain 10/04/2014    Past Surgical History:  Procedure Laterality Date  . CHOLECYSTECTOMY  2016  . COLECTOMY  2013  . COLOSTOMY  2013  . COLOSTOMY REVERSAL  2014  . LEG SURGERY Left 08/12/2005   lateral compartment release  . NASAL SEPTOPLASTY W/ TURBINOPLASTY Bilateral 01/04/2017   Procedure: NASAL SEPTOPLASTY WITH TURBINATE REDUCTION;  Surgeon: Izora Gala, MD;  Location: [facility];  Service: ENT;  Laterality: Bilateral;  . NASAL SINUS SURGERY Bilateral 01/04/2017   Procedure: ENDOSCOPIC SINUS SURGERY;  Surgeon: Izora Gala, MD;  Location: [facility];  Service: ENT;  Laterality: Bilateral;  . SHOULDER SURGERY    . UMBILICAL HERNIA REPAIR  2016   x 2  . URETERAL STENT PLACEMENT  2014  Family History  Problem Relation Age of Onset  . Breast cancer Mother   . Arthritis Mother   . Anesthesia problems Mother        hard to wake up post-op  . Arthritis Father   . Diabetes Maternal Grandmother   . Hypertension Maternal Grandmother   . Heart disease Maternal Grandmother   . Cancer Maternal Grandfather   . Colon cancer Maternal Grandfather   . Diabetes Paternal Grandmother   . Heart disease Paternal Grandmother   . Diabetes Paternal Grandfather   .  Diabetes Paternal Aunt   . Colon cancer Paternal Uncle     Social History   Tobacco Use  . Smoking status: Never Smoker  . Smokeless tobacco: Never Used  Vaping Use  . Vaping Use: Never used  Substance Use Topics  . Alcohol use: Yes    Alcohol/week: 0.0 standard drinks    Comment: socially  . Drug use: No    Home Medications Prior to Admission medications   Medication Sig Start Date End Date Taking? Authorizing Provider  baclofen (LIORESAL) 10 MG tablet TAKE 1/2 TO 1 TABLET(5 TO 10 MG) BY MOUTH AT BEDTIME AS NEEDED FOR MUSCLE SPASMS 03/31/20   Hilts, Legrand Como, MD  Multiple Vitamins-Minerals (MULTIVITAMIN ADULT PO) Take 1 tablet by mouth daily. Reported on 01/18/2016    [provider]  baclofen (LIORESAL) 10 MG tablet Take 0.5-1 tablets (5-10 mg total) by mouth at bedtime as needed for muscle spasms. 08/15/19   Hilts, Michael, MD    Allergies    Gadolinium derivatives, Lactose intolerance (gi), Percocet [oxycodone-acetaminophen], and Fentanyl  Review of Systems   Review of Systems  Gastrointestinal: Positive for abdominal pain.  Musculoskeletal: Positive for back pain.  All other systems reviewed and are negative.   Physical Exam Updated Vital Signs BP (!) 150/93 (BP Location: Right Arm)   Pulse 86   Temp 98.6 F (37 C) (Oral)   Resp (!) 29   SpO2 100%   Physical Exam Vitals and nursing note reviewed.  Constitutional:      Appearance: He is well-developed. He is obese. He is diaphoretic.     Comments: Patient laying in bed appears uncomfortable, diaphoretic.  HENT:     Head: Atraumatic.  Eyes:     Conjunctiva/sclera: Conjunctivae normal.  Cardiovascular:     Rate and Rhythm: Normal rate and regular rhythm.  Pulmonary:     Effort: Pulmonary effort is normal.     Breath sounds: Normal breath sounds. No wheezing, rhonchi or rales.  Abdominal:     General: Abdomen is flat. Bowel sounds are normal.     Palpations: Abdomen is soft.     Tenderness: There is  generalized abdominal tenderness and tenderness in the periumbilical area, left upper quadrant and left lower quadrant. There is left CVA tenderness. There is no right CVA tenderness.     Hernia: No hernia is present.  Musculoskeletal:     Cervical back: Neck supple.  Skin:    Findings: No rash.  Neurological:     Mental Status: He is alert and oriented to person, place, and time.  Psychiatric:        Mood and Affect: Mood is anxious.     ED Results / Procedures / Treatments   Labs (all labs ordered are listed, but only abnormal results are displayed) Labs Reviewed  COMPREHENSIVE METABOLIC PANEL - Abnormal; Notable for the following components:      Result Value   Potassium 3.4 (*)  CO2 19 (*)    Glucose, Bld 105 (*)    ALT 47 (*)    Alkaline Phosphatase 37 (*)    All other components within normal limits  CBC WITH DIFFERENTIAL/PLATELET  LIPASE, BLOOD  URINALYSIS, ROUTINE W REFLEX MICROSCOPIC    EKG EKG Interpretation  Date/Time:  Tuesday July 16 2020 17:52:44 EST Ventricular Rate:  83 PR Interval:    QRS Duration: 101 QT Interval:  372 QTC Calculation: 438 R Axis:   30 Text Interpretation: Sinus rhythm Normal ECG No previous tracing Confirmed by Blanchie Dessert ([id number]) on 07/16/2020 5:58:57 PM   Radiology CT Renal Stone Study  Result Date: 07/16/2020 CLINICAL DATA:  Flank pain. EXAM: CT ABDOMEN AND PELVIS WITHOUT CONTRAST TECHNIQUE: Multidetector CT imaging of the abdomen and pelvis was performed following the standard protocol without IV contrast. COMPARISON:  September 23, 2018 FINDINGS: Lower chest: No acute abnormality. Hepatobiliary: No focal liver abnormality is seen. Status post cholecystectomy. No biliary dilatation. Pancreas: Unremarkable. No pancreatic ductal dilatation or surrounding inflammatory changes. Spleen: Normal in size without focal abnormality. Adrenals/Urinary Tract: Adrenal glands are unremarkable. Kidneys are normal in size, without renal  calculi or focal lesions. Moderate severity left-sided hydronephrosis and hydroureter is seen. No obstructing renal stones are identified. A 2 mm calcification is seen within the dependent portion of the urinary bladder (axial CT image 85, CT series number 3). This represents a new finding when compared to the prior exam. Stomach/Bowel: Stomach is within normal limits. Appendix appears normal. Surgically anastomosed bowel is seen within the mid to distal sigmoid colon. No evidence of bowel wall thickening, distention, or inflammatory changes. Vascular/Lymphatic: No significant vascular findings are present. No enlarged abdominal or pelvic lymph nodes. Reproductive: Prostate is unremarkable. Other: No abdominal wall hernia or abnormality. No abdominopelvic ascites. Musculoskeletal: No acute or significant osseous findings. IMPRESSION: 1. Moderate severity left-sided hydronephrosis and hydroureter without visible obstructing renal calculi. 2. 2 mm calcification within the lumen of the urinary bladder which may represent a recently passed renal calculus. 3. Evidence of prior cholecystectomy. Electronically Signed   By: Virgina Norfolk M.D.   On: 07/16/2020 20:06    Procedures Procedures (including critical care time)  Medications Ordered in ED Medications  morphine 4 MG/ML injection 6 mg (6 mg Intravenous Given 07/16/20 1832)  ondansetron (ZOFRAN) injection 4 mg (4 mg Intravenous Given 07/16/20 1831)  ketorolac (TORADOL) 30 MG/ML injection 30 mg (30 mg Intravenous Given 07/16/20 2143)    ED Course  I have reviewed the triage vital signs and the nursing notes.  Pertinent labs & imaging results that were available during my care of the patient were reviewed by me and considered in my medical decision making (see chart for details).    MDM Rules/Calculators/A&P                          BP 125/71   Pulse 93   Temp 98.6 F (37 C) (Oral)   Resp (!) 22   Ht 6\' 2"  (1.88 m)   Wt 115.7 kg   SpO2  99%   BMI 32.74 kg/m   Final Clinical Impression(s) / ED Diagnoses Final diagnoses:  Kidney stone on left side    Rx / DC Orders ED Discharge Orders    None     6:13 PM Patient here with acute onset of left flank pain and abdominal pain.  No prior history of kidney stone but does have prior history of abdominal  injury with impalement of his colon requiring colostomy and reanastomosis in the past.  He appears very uncomfortable with CVA tenderness.  He is diaphoretic likely secondary to pain.  Will provide pain medication, antinausea medication, will obtain CT renal stone study for further evaluation.  8:29 PM Labs are reassuring, UA pending however CT renal stone study demonstrate moderate hydronephrosis on the left with a 2 mm stone in the bladder suggestive of a recently passed kidney stone.  This finding is consistent with patient's presentation.  On reexamination, he did report some improvement with his pain after initial dose of pain medication, will give Toradol for pain management and will await UA results to ensure patient does not have an infected stone.   Domenic Moras, PA-C 07/16/20 2148    Blanchie Dessert, MD 07/17/20 [phone]

## 2020-07-16 NOTE — ED Triage Notes (Signed)
Pt arrives via EMS from home with complaints of back pain radiating to belly. Pt complains of left arm tingling. Pt alert and oriented X4. Pt moaning during triage. Pt endorses SOB. Pt vomiting at home. Symptoms began at [place] today.  Pt tachypnea and diaphoretic with EMS  186/109 HR 92 100RA 24 RR

## 2020-07-31 ENCOUNTER — Encounter: Payer: Self-pay | Admitting: Family Medicine

## 2020-09-13 ENCOUNTER — Encounter: Payer: Self-pay | Admitting: Family Medicine

## 2020-09-13 ENCOUNTER — Ambulatory Visit (INDEPENDENT_AMBULATORY_CARE_PROVIDER_SITE_OTHER): Payer: 59 | Admitting: Family Medicine

## 2020-09-13 ENCOUNTER — Other Ambulatory Visit: Payer: Self-pay

## 2020-09-13 DIAGNOSIS — M25561 Pain in right knee: Secondary | ICD-10-CM

## 2020-09-13 MED ORDER — DICLOFENAC SODIUM 1 % EX GEL: 4.0000 g | Freq: Four times a day (QID) | CUTANEOUS | 6 refills | Status: DC | PRN

## 2020-09-13 NOTE — Progress Notes (Signed)
Office Visit Note   Patient: Paul Humphrey           Date of Birth: [birth year]           MRN: [id number] Visit Date: 09/13/2020 Requested by: Eunice Blase, MD [address] [place],  [place] [address] PCP: Eunice Blase, MD  Subjective: Chief Complaint  Patient presents with   Right Knee - Pain    Knee is swelling prepatellar area. Remembers "bumping" the knee on something, but it did not really hurt. The knee does click some. First noticed a few days ago.   Other    Has noticed a hard area just beneath the skin at the top of the anterior thigh/hip area. He thought it was an ingrown hair that fell out and left a "bruised looking area." Some soreness remains. Noticed 2 weeks ago.    HPI: He is here with a couple concerns.  Few days ago he noticed swelling in his knee when kneeling.  He recalls a slight bump to the knee a week or 2 ago, but it did not hurt at all.  Now it is uncomfortable when he tries to kneel, but when sitting or walking it is not too bothersome.  He has tried ice but it did not make much difference.  Also he has noticed intermittently a subcutaneous nodule in the right inguinal area.  He thought it was an ingrown hair but it never fully cleared up.  Incidentally, his upper extremity lymphedema issues have improved significantly with treatment.                ROS:   All other systems were reviewed and are negative.  Objective: Vital Signs: There were no vitals taken for this visit.  Physical Exam:  General:  Alert and oriented, in no acute distress. Pulm:  Breathing unlabored. Psy:  Normal mood, congruent affect. Skin: In the right inguinal area there is a subcutaneous firmness with a slight pigmentation of the overlying skin, it is very small, probably 2 to 3 mm with no warmth or erythema and no drainage.  It feels like subcutaneous scar tissue.  He has no detectable inguinal hernia. Right knee: No intra-articular effusion.  There is 1-2+ swelling of the  prepatellar bursa with no warmth or erythema and minimal tenderness.  No patellofemoral crepitus, no joint line tenderness.    Imaging: No results found.  Assessment & Plan: 1. right knee aseptic prepatellar bursitis -Ice, Voltaren gel, Ace wrap for compression.  Aspirate and possibly inject if symptoms persist or worsen.  2.  Right inguinal area subcutaneous nodule, question scar tissue from prior sebaceous cyst. -He will notify me if it gets bigger or symptomatic and I will refer him to dermatology.  Otherwise it appears to be a benign issue.     Procedures: No procedures performed        PMFS History: Patient Active Problem List   Diagnosis Date Noted   Class 1 obesity due to excess calories without serious comorbidity with body mass index (BMI) of 33.0 to 33.9 in adult 05/23/2019   Other specified postprocedural states 11/02/2017   Impingement syndrome of shoulder region 09/14/2017   Chronic ethmoidal sinusitis 11/24/2016   Chronic maxillary sinusitis 11/24/2016   Deviated septum 11/24/2016   Pain in both hands 11/23/2016   Great toe pain, left 11/23/2016   GERD (gastroesophageal reflux disease) 03/18/2015   Abdominal pain 01/07/2015   Tingling 01/07/2015   Suspected exposure to heavy metals 01/07/2015  Vitamin D deficiency 01/07/2015   Epigastric pain 10/04/2014   Past Medical History:  Diagnosis Date   Arthritis of big toe    left   Bulging of cervical intervertebral disc    Chicken pox 1992   Chronic ethmoidal sinusitis 12/2016   Chronic maxillary sinusitis 12/2016   Dental crowns present    Deviated nasal septum 12/2016   Family history of adverse reaction to anesthesia    pt's mother has hx. of being hard to wake up post-op   Lip laceration 12/28/2016   no stitches required, per pt.   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    Sinus headache     Family History  Problem Relation Age of Onset   Breast cancer  Mother    Arthritis Mother    Anesthesia problems Mother        hard to wake up post-op   Arthritis Father    Diabetes Maternal Grandmother    Hypertension Maternal Grandmother    Heart disease Maternal Grandmother    Cancer Maternal Grandfather    Colon cancer Maternal Grandfather    Diabetes Paternal Grandmother    Heart disease Paternal Grandmother    Diabetes Paternal Grandfather    Diabetes Paternal Aunt    Colon cancer Paternal Uncle     Past Surgical History:  Procedure Laterality Date   CHOLECYSTECTOMY  2016   COLECTOMY  2013   COLOSTOMY  2013   COLOSTOMY REVERSAL  2014   LEG SURGERY Left 08/12/2005   lateral compartment release   NASAL SEPTOPLASTY W/ TURBINOPLASTY Bilateral 01/04/2017   Procedure: NASAL SEPTOPLASTY WITH TURBINATE REDUCTION;  Surgeon: Izora Gala, MD;  Location: [facility];  Service: ENT;  Laterality: Bilateral;   NASAL SINUS SURGERY Bilateral 01/04/2017   Procedure: ENDOSCOPIC SINUS SURGERY;  Surgeon: Izora Gala, MD;  Location: [facility];  Service: ENT;  Laterality: Bilateral;   [place]  2016   x 2   URETERAL STENT PLACEMENT  2014   Social History   Occupational History   Not on file  Tobacco Use   Smoking status: Never Smoker   Smokeless tobacco: Never Used  Vaping Use   Vaping Use: Never used  Substance and Sexual Activity   Alcohol use: Yes    Alcohol/week: 0.0 standard drinks    Comment: socially   Drug use: No   Sexual activity: Yes

## 2020-10-09 ENCOUNTER — Encounter: Payer: Self-pay | Admitting: Family Medicine

## 2020-10-09 ENCOUNTER — Ambulatory Visit (INDEPENDENT_AMBULATORY_CARE_PROVIDER_SITE_OTHER): Payer: 59 | Admitting: Family Medicine

## 2020-10-09 ENCOUNTER — Ambulatory Visit: Payer: Self-pay

## 2020-10-09 ENCOUNTER — Other Ambulatory Visit: Payer: Self-pay

## 2020-10-09 DIAGNOSIS — M25561 Pain in right knee: Secondary | ICD-10-CM

## 2020-10-09 NOTE — Progress Notes (Signed)
Office Visit Note   Patient: Paul Humphrey           Date of Birth: [birth year]           MRN: [id number] Visit Date: 10/09/2020 Requested by: Eunice Blase, MD [address] [place],  [place] [address] PCP: Eunice Blase, MD  Subjective: Chief Complaint  Patient presents with  . Right Knee - Pain    Still having persistent intermittent pain and swelling. The pain is now on the medial aspect. Mostly dull pain, but does have an occasional sharp pain there. Has felt something hard in the prepatellar area, that moves around.     HPI: Here with persistent right knee pain and swelling.  Feels like there's something loose in front of the knee.  Also having medial knee soreness.              ROS:   All other systems were reviewed and are negative.  Objective: Vital Signs: There were no vitals taken for this visit.  Physical Exam:  General:  Alert and oriented, in no acute distress. Pulm:  Breathing unlabored. Psy:  Normal mood, congruent affect. Skin:  No erythema.  Right knee:  1+ prepatellar bursa swelling.  Thickening of the bursa, not sure whether loose body palpable.  Mild medial joint line tenderness.  No knee effusion.    Imaging: XR KNEE 3 VIEW RIGHT  Result Date: 10/09/2020 X-Rays show no bony abnormality, no obvious loose body.  Swelling in prepatellar bursa area.   Assessment & Plan: 1.  Right knee prepatellar bursitis, question MMT. - Discussed options and elected to aspirate and inject the bursa.  If no relief, then MRI of the knee.     Procedures: Right knee aspiration:  After sterile prep, injected 3 cc 0.25% bupivacaine and aspirated 5 cc blood-tinged synovial fluid, then injected 6 mg betamethasone into the prepatellar bursa.       PMFS History: Patient Active Problem List   Diagnosis Date Noted  . Class 1 obesity due to excess calories without serious comorbidity with body mass index (BMI) of 33.0 to 33.9 in adult 05/23/2019  . Other specified  postprocedural states 11/02/2017  . Impingement syndrome of shoulder region 09/14/2017  . Chronic ethmoidal sinusitis 11/24/2016  . Chronic maxillary sinusitis 11/24/2016  . Deviated septum 11/24/2016  . Pain in both hands 11/23/2016  . Great toe pain, left 11/23/2016  . GERD (gastroesophageal reflux disease) 03/18/2015  . Abdominal pain 01/07/2015  . Tingling 01/07/2015  . Suspected exposure to heavy metals 01/07/2015  . Vitamin D deficiency 01/07/2015  . Epigastric pain 10/04/2014   Past Medical History:  Diagnosis Date  . Arthritis of big toe    left  . Bulging of cervical intervertebral disc   . Chicken pox 1992  . Chronic ethmoidal sinusitis 12/2016  . Chronic maxillary sinusitis 12/2016  . Dental crowns present   . Deviated nasal septum 12/2016  . Family history of adverse reaction to anesthesia    pt's mother has hx. of being hard to wake up post-op  . Lip laceration 12/28/2016   no stitches required, per pt.  . 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 Sinus headache     Family History  Problem Relation Age of Onset  . Breast cancer Mother   . Arthritis Mother   . Anesthesia problems Mother        hard to wake up post-op  . Arthritis Father   . Diabetes Maternal Grandmother   .  Hypertension Maternal Grandmother   . Heart disease Maternal Grandmother   . Cancer Maternal Grandfather   . Colon cancer Maternal Grandfather   . Diabetes Paternal Grandmother   . Heart disease Paternal Grandmother   . Diabetes Paternal Grandfather   . Diabetes Paternal Aunt   . Colon cancer Paternal Uncle     Past Surgical History:  Procedure Laterality Date  . CHOLECYSTECTOMY  2016  . COLECTOMY  2013  . COLOSTOMY  2013  . COLOSTOMY REVERSAL  2014  . LEG SURGERY Left 08/12/2005   lateral compartment release  . NASAL SEPTOPLASTY W/ TURBINOPLASTY Bilateral 01/04/2017   Procedure: NASAL SEPTOPLASTY WITH TURBINATE REDUCTION;  Surgeon: Izora Gala, MD;  Location:  [facility];  Service: ENT;  Laterality: Bilateral;  . NASAL SINUS SURGERY Bilateral 01/04/2017   Procedure: ENDOSCOPIC SINUS SURGERY;  Surgeon: Izora Gala, MD;  Location: [facility];  Service: ENT;  Laterality: Bilateral;  . SHOULDER SURGERY    . UMBILICAL HERNIA REPAIR  2016   x 2  . URETERAL STENT PLACEMENT  2014   Social History   Occupational History  . Not on file  Tobacco Use  . Smoking status: Never Smoker  . Smokeless tobacco: Never Used  Vaping Use  . Vaping Use: Never used  Substance and Sexual Activity  . Alcohol use: Yes    Alcohol/week: 0.0 standard drinks    Comment: socially  . Drug use: No  . Sexual activity: Yes

## 2020-12-20 ENCOUNTER — Telehealth: Payer: Self-pay | Admitting: Family Medicine

## 2020-12-20 ENCOUNTER — Encounter: Payer: Self-pay | Admitting: Family Medicine

## 2020-12-20 MED ORDER — NIRMATRELVIR/RITONAVIR (PAXLOVID)TABLET: ORAL_TABLET | ORAL | 0 refills | Status: DC

## 2020-12-20 MED ORDER — NIRMATRELVIR/RITONAVIR (PAXLOVID)TABLET
ORAL_TABLET | ORAL | 0 refills | Status: DC
Start: 2020-12-20 — End: 2021-01-22

## 2020-12-20 NOTE — Telephone Encounter (Signed)
Patient called asking about update for message he sent in mychart. He states he really need that prescription. Please send to pharmacy on file and call patient when called in. Patient phone number is [phone].

## 2020-12-20 NOTE — Addendum Note (Signed)
Addended by: Hortencia Pilar on: 12/20/2020 09:56 AM   Modules accepted: Orders

## 2020-12-31 ENCOUNTER — Telehealth: Payer: Self-pay | Admitting: Family Medicine

## 2021-01-06 ENCOUNTER — Encounter: Payer: Self-pay | Admitting: Family Medicine

## 2021-01-07 MED ORDER — AZITHROMYCIN 250 MG PO TABS: ORAL_TABLET | ORAL | 0 refills | Status: DC

## 2021-01-10 ENCOUNTER — Encounter: Payer: Self-pay | Admitting: Family Medicine

## 2021-01-22 ENCOUNTER — Ambulatory Visit (INDEPENDENT_AMBULATORY_CARE_PROVIDER_SITE_OTHER): Payer: 59 | Admitting: Family Medicine

## 2021-01-22 ENCOUNTER — Other Ambulatory Visit: Payer: Self-pay

## 2021-01-22 ENCOUNTER — Encounter: Payer: Self-pay | Admitting: Family Medicine

## 2021-01-22 ENCOUNTER — Ambulatory Visit: Payer: Self-pay

## 2021-01-22 VITALS — BP 126/88 | HR 79 | Ht 73.5 in | Wt 267.4 lb

## 2021-01-22 DIAGNOSIS — M7989 Other specified soft tissue disorders: Secondary | ICD-10-CM | POA: Diagnosis not present

## 2021-01-22 DIAGNOSIS — R0789 Other chest pain: Secondary | ICD-10-CM

## 2021-01-22 DIAGNOSIS — Z0000 Encounter for general adult medical examination without abnormal findings: Secondary | ICD-10-CM | POA: Diagnosis not present

## 2021-01-22 DIAGNOSIS — R739 Hyperglycemia, unspecified: Secondary | ICD-10-CM

## 2021-01-22 DIAGNOSIS — R059 Cough, unspecified: Secondary | ICD-10-CM

## 2021-01-22 DIAGNOSIS — E559 Vitamin D deficiency, unspecified: Secondary | ICD-10-CM

## 2021-01-22 NOTE — Progress Notes (Signed)
Office Visit Note   Patient: Paul Humphrey           Date of Birth: 12-31-1985           MRN: [id number] Visit Date: 01/22/2021 Requested by: Eunice Blase, MD [address] [place],  [place] [address] PCP: Eunice Blase, MD  Subjective: Chief Complaint  Patient presents with  . Annual Exam    HPI: He is here for a wellness exam.  Since having COVID, he has periodic coughing fits.  He would like a chest x-ray.  No shortness of breath.  He is doing his lymphedema treatments with good results, although it is very time-consuming.  His right knee still swells intermittently but it is not bothersome enough to warrant additional treatment right now.  He is concerned about the need for a colonoscopy.  His father has had precancerous polyps.  Patient has a history of diverticulosis.  He is considering paying cash for a colonoscopy in the next couple years.  He is up-to-date on eye exams and dental exams.  Family history was reviewed.                ROS:   All other systems were reviewed and are negative.  Objective: Vital Signs: BP 126/88 (BP Location: Left Arm)   Pulse 79   Ht 6' 1.5" (1.867 m)   Wt 267 lb 6.4 oz (121.3 kg)   BMI 34.80 kg/m   Physical Exam:  General:  Alert and oriented, in no acute distress. Pulm:  Breathing unlabored. Psy:  Normal mood, congruent affect. Skin: No suspicious lesions H HEENT: No thyromegaly or nodules, 2+ carotid pulses. CV: Regular rate and rhythm without murmurs, rubs, or gallops.  No peripheral edema.  2+ radial and posterior tibial pulses. Lungs: Clear to auscultation throughout with no wheezing or areas of consolidation. Abd: Bowel sounds are active, no hepatosplenomegaly or masses.  Soft and nontender.  No audible bruits.  No evidence of ascites.  Scars are soft. Right knee:  Trace swelling of the prepatellar bursa.  No intra-articular effusion.   Imaging: XR Chest 2 View  Result Date: 01/22/2021 Chest x-ray reveals  normal-appearing lung fields, no sign of pneumonia.  Heart size is normal.   Assessment & Plan: 1.  Wellness exam - Labs today.  Follow-up yearly.  2.  Cough, post COVID -Reassurance that lungs look okay.  Hopefully with time this will subside.  3.  History of vitamin D deficiency, hyperglycemia, elevated lipids. - Recheck labs today.  4.  Lymphedema, currently manageable.  5.  Right knee intermittent swelling, currently manageable.     Procedures: No procedures performed        PMFS History: Patient Active Problem List   Diagnosis Date Noted  . Class 1 obesity due to excess calories without serious comorbidity with body mass index (BMI) of 33.0 to 33.9 in adult 05/23/2019  . Other specified postprocedural states 11/02/2017  . Impingement syndrome of shoulder region 09/14/2017  . Chronic ethmoidal sinusitis 11/24/2016  . Chronic maxillary sinusitis 11/24/2016  . Deviated septum 11/24/2016  . Pain in both hands 11/23/2016  . Great toe pain, left 11/23/2016  . GERD (gastroesophageal reflux disease) 03/18/2015  . Abdominal pain 01/07/2015  . Tingling 01/07/2015  . Suspected exposure to heavy metals 01/07/2015  . Vitamin D deficiency 01/07/2015  . Epigastric pain 10/04/2014   Past Medical History:  Diagnosis Date  . Arthritis of big toe    left  . Bulging of cervical intervertebral  disc   . Chicken pox 1992  . Chronic ethmoidal sinusitis 12/2016  . Chronic maxillary sinusitis 12/2016  . Dental crowns present   . Deviated nasal septum 12/2016  . Family history of adverse reaction to anesthesia    pt's mother has hx. of being hard to wake up post-op  . Lip laceration 12/28/2016   no stitches required, per pt.  . 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 Sinus headache     Family History  Problem Relation Age of Onset  . Breast cancer Mother   . Arthritis Mother   . Anesthesia problems Mother        hard to wake up post-op  . Arthritis  Father   . Healthy Brother   . Diabetes Maternal Grandmother   . Hypertension Maternal Grandmother   . Heart disease Maternal Grandmother   . Cancer Maternal Grandfather   . Colon cancer Maternal Grandfather   . Diabetes Paternal Grandmother   . Heart disease Paternal Grandmother   . Diabetes Paternal Grandfather   . Diabetes Paternal Aunt   . Colon cancer Paternal Uncle     Past Surgical History:  Procedure Laterality Date  . CHOLECYSTECTOMY  2016  . COLECTOMY  2013  . COLOSTOMY  2013  . COLOSTOMY REVERSAL  2014  . LEG SURGERY Left 08/12/2005   lateral compartment release  . NASAL SEPTOPLASTY W/ TURBINOPLASTY Bilateral 01/04/2017   Procedure: NASAL SEPTOPLASTY WITH TURBINATE REDUCTION;  Surgeon: Izora Gala, MD;  Location: [facility];  Service: ENT;  Laterality: Bilateral;  . NASAL SINUS SURGERY Bilateral 01/04/2017   Procedure: ENDOSCOPIC SINUS SURGERY;  Surgeon: Izora Gala, MD;  Location: [facility];  Service: ENT;  Laterality: Bilateral;  . SHOULDER SURGERY    . UMBILICAL HERNIA REPAIR  2016   x 2  . URETERAL STENT PLACEMENT  2014   Social History   Occupational History  . Not on file  Tobacco Use  . Smoking status: Never Smoker  . Smokeless tobacco: Never Used  Vaping Use  . Vaping Use: Never used  Substance and Sexual Activity  . Alcohol use: Yes    Alcohol/week: 0.0 standard drinks    Comment: socially  . Drug use: No  . Sexual activity: Yes

## 2021-01-23 ENCOUNTER — Telehealth: Payer: Self-pay | Admitting: Family Medicine

## 2021-01-23 LAB — CBC WITH DIFFERENTIAL/PLATELET
Absolute Monocytes: 420 cells/uL (ref 200–950)
Basophils Absolute: 50 cells/uL (ref 0–200)
Basophils Relative: 0.9 %
Eosinophils Absolute: 151 cells/uL (ref 15–500)
Eosinophils Relative: 2.7 %
HCT: 50.7 % — ABNORMAL HIGH (ref 38.5–50.0)
Hemoglobin: 16.7 g/dL (ref 13.2–17.1)
Lymphs Abs: 1422 cells/uL (ref 850–3900)
MCH: 29.9 pg (ref 27.0–33.0)
MCHC: 32.9 g/dL (ref 32.0–36.0)
MCV: 90.9 fL (ref 80.0–100.0)
MPV: 10.3 fL (ref 7.5–12.5)
Monocytes Relative: 7.5 %
Neutro Abs: 3556 cells/uL (ref 1500–7800)
Neutrophils Relative %: 63.5 %
Platelets: 171 10*3/uL (ref 140–400)
RBC: 5.58 10*6/uL (ref 4.20–5.80)
RDW: 13 % (ref 11.0–15.0)
Total Lymphocyte: 25.4 %
WBC: 5.6 10*3/uL (ref 3.8–10.8)

## 2021-01-23 LAB — COMPREHENSIVE METABOLIC PANEL
AG Ratio: 1.7 (calc) (ref 1.0–2.5)
ALT: 37 U/L (ref 9–46)
AST: 24 U/L (ref 10–40)
Albumin: 4.5 g/dL (ref 3.6–5.1)
Alkaline phosphatase (APISO): 43 U/L (ref 36–130)
BUN: 13 mg/dL (ref 7–25)
CO2: 23 mmol/L (ref 20–32)
Calcium: 9.5 mg/dL (ref 8.6–10.3)
Chloride: 105 mmol/L (ref 98–110)
Creat: 1.1 mg/dL (ref 0.60–1.35)
Globulin: 2.6 g/dL (calc) (ref 1.9–3.7)
Glucose, Bld: 82 mg/dL (ref 65–99)
Potassium: 4.7 mmol/L (ref 3.5–5.3)
Sodium: 139 mmol/L (ref 135–146)
Total Bilirubin: 0.7 mg/dL (ref 0.2–1.2)
Total Protein: 7.1 g/dL (ref 6.1–8.1)

## 2021-01-23 LAB — LIPID PANEL
Cholesterol: 149 mg/dL (ref <200)
HDL: 37 mg/dL — ABNORMAL LOW (ref >=40)
LDL Cholesterol (Calc): 81 mg/dL (calc)
Non-HDL Cholesterol (Calc): 112 mg/dL (calc) (ref <130)
Total CHOL/HDL Ratio: 4 (calc) (ref <5.0)
Triglycerides: 211 mg/dL — ABNORMAL HIGH (ref <150)

## 2021-01-23 LAB — THYROID PANEL WITH TSH
Free Thyroxine Index: 2.2 (ref 1.4–3.8)
T3 Uptake: 34 % (ref 22–35)
T4, Total: 6.5 ug/dL (ref 4.9–10.5)
TSH: 1.84 mIU/L (ref 0.40–4.50)

## 2021-01-23 LAB — VITAMIN D 25 HYDROXY (VIT D DEFICIENCY, FRACTURES): Vit D, 25-Hydroxy: 51 ng/mL (ref 30–100)

## 2021-01-23 LAB — HEMOGLOBIN A1C
Hgb A1c MFr Bld: 4.7 % of total Hgb (ref <5.7)
Mean Plasma Glucose: 88 mg/dL
eAG (mmol/L): 4.9 mmol/L

## 2021-01-23 LAB — HIGH SENSITIVITY CRP: hs-CRP: 1.3 mg/L

## 2021-01-23 NOTE — Telephone Encounter (Signed)
Labs are notable for the following:  Vitamin D looks perfect.  Continue with current dosage.  CBC and metabolic panel look good.  CRP and A1c look good.  Triglycerides are still elevated and HDL low.  Generally these will normalize with dietary limitation of processed carbohydrates and sweets, along with regular exercise.  No treatment indicated otherwise right now, but would recommend rechecking in 6 to 12 months.  Thyroid function looks good.

## 2021-01-29 ENCOUNTER — Ambulatory Visit: Payer: 59

## 2021-01-29 NOTE — Progress Notes (Signed)
   Covid-19 Vaccination Clinic  Name:  Paul Humphrey    MRN: [id number] DOB: December 17, 1985  01/29/2021  Mr. Paul Humphrey was observed post Covid-19 immunization for 15 minutes without incident. He was provided with Vaccine Information Sheet and instruction to access the V-Safe system.   Paul Humphrey was instructed to call 911 with any severe reactions post vaccine: Marland Kitchen Difficulty breathing  . Swelling of face and throat  . A fast heartbeat  . A bad rash all over body  . Dizziness and weakness

## 2021-01-30 ENCOUNTER — Other Ambulatory Visit (HOSPITAL_BASED_OUTPATIENT_CLINIC_OR_DEPARTMENT_OTHER): Payer: Self-pay

## 2021-01-30 MED ORDER — COVID-19 MRNA VAC-TRIS(PFIZER) 30 MCG/0.3ML IM SUSP
INTRAMUSCULAR | 0 refills | Status: DC
Start: 2021-01-29 — End: 2021-08-05
  Filled 2021-01-30: qty 0.3, 1d supply, fill #0

## 2021-04-13 ENCOUNTER — Encounter: Payer: Self-pay | Admitting: Family Medicine

## 2021-04-14 MED ORDER — PAXLOVID (150/100) 10 X 150 MG & 10 X 100MG PO TBPK: 1.0000 | ORAL_TABLET | Freq: Two times a day (BID) | ORAL | 0 refills | Status: DC

## 2021-08-05 ENCOUNTER — Ambulatory Visit (INDEPENDENT_AMBULATORY_CARE_PROVIDER_SITE_OTHER): Payer: 59 | Admitting: Emergency Medicine

## 2021-08-05 ENCOUNTER — Other Ambulatory Visit: Payer: Self-pay | Admitting: Emergency Medicine

## 2021-08-05 ENCOUNTER — Other Ambulatory Visit: Payer: Self-pay

## 2021-08-05 ENCOUNTER — Encounter: Payer: Self-pay | Admitting: Emergency Medicine

## 2021-08-05 VITALS — BP 118/70 | HR 95 | Temp 98.6°F | Ht 74.0 in | Wt 272.0 lb

## 2021-08-05 DIAGNOSIS — Z13228 Encounter for screening for other metabolic disorders: Secondary | ICD-10-CM | POA: Diagnosis not present

## 2021-08-05 DIAGNOSIS — Z13220 Encounter for screening for lipoid disorders: Secondary | ICD-10-CM | POA: Diagnosis not present

## 2021-08-05 DIAGNOSIS — Z8619 Personal history of other infectious and parasitic diseases: Secondary | ICD-10-CM | POA: Diagnosis not present

## 2021-08-05 DIAGNOSIS — Z1159 Encounter for screening for other viral diseases: Secondary | ICD-10-CM | POA: Diagnosis not present

## 2021-08-05 DIAGNOSIS — Z8371 Family history of colonic polyps: Secondary | ICD-10-CM

## 2021-08-05 DIAGNOSIS — Z0000 Encounter for general adult medical examination without abnormal findings: Secondary | ICD-10-CM

## 2021-08-05 DIAGNOSIS — Z1329 Encounter for screening for other suspected endocrine disorder: Secondary | ICD-10-CM

## 2021-08-05 DIAGNOSIS — Z1211 Encounter for screening for malignant neoplasm of colon: Secondary | ICD-10-CM

## 2021-08-05 DIAGNOSIS — Z130 Encounter for screening for diseases of the blood and blood-forming organs and certain disorders involving the immune mechanism: Secondary | ICD-10-CM

## 2021-08-05 DIAGNOSIS — Z800 Family history of malignant neoplasm of digestive organs: Secondary | ICD-10-CM | POA: Diagnosis not present

## 2021-08-05 LAB — LIPID PANEL
Cholesterol: 132 mg/dL (ref 0–200)
HDL: 26.8 mg/dL — ABNORMAL LOW (ref >39.00)
NonHDL: 105.01
Total CHOL/HDL Ratio: 5
Triglycerides: 321 mg/dL — ABNORMAL HIGH (ref 0.0–149.0)
VLDL: 64.2 mg/dL — ABNORMAL HIGH (ref 0.0–40.0)

## 2021-08-05 LAB — COMPREHENSIVE METABOLIC PANEL
ALT: 33 U/L (ref 0–53)
AST: 25 U/L (ref 0–37)
Albumin: 4.3 g/dL (ref 3.5–5.2)
Alkaline Phosphatase: 37 U/L — ABNORMAL LOW (ref 39–117)
BUN: 12 mg/dL (ref 6–23)
CO2: 27 mEq/L (ref 19–32)
Calcium: 9.4 mg/dL (ref 8.4–10.5)
Chloride: 105 mEq/L (ref 96–112)
Creatinine, Ser: 0.96 mg/dL (ref 0.40–1.50)
GFR: 104.21 mL/min (ref >60.00)
Glucose, Bld: 82 mg/dL (ref 70–99)
Potassium: 4.1 mEq/L (ref 3.5–5.1)
Sodium: 137 mEq/L (ref 135–145)
Total Bilirubin: 0.7 mg/dL (ref 0.2–1.2)
Total Protein: 7.1 g/dL (ref 6.0–8.3)

## 2021-08-05 LAB — CBC WITH DIFFERENTIAL/PLATELET
Basophils Absolute: 0 10*3/uL (ref 0.0–0.1)
Basophils Relative: 0.6 % (ref 0.0–3.0)
Eosinophils Absolute: 0.2 10*3/uL (ref 0.0–0.7)
Eosinophils Relative: 3.3 % (ref 0.0–5.0)
HCT: 46.7 % (ref 39.0–52.0)
Hemoglobin: 15.9 g/dL (ref 13.0–17.0)
Lymphocytes Relative: 31.5 % (ref 12.0–46.0)
Lymphs Abs: 1.8 10*3/uL (ref 0.7–4.0)
MCHC: 34.1 g/dL (ref 30.0–36.0)
MCV: 88.3 fl (ref 78.0–100.0)
Monocytes Absolute: 0.5 10*3/uL (ref 0.1–1.0)
Monocytes Relative: 8.2 % (ref 3.0–12.0)
Neutro Abs: 3.3 10*3/uL (ref 1.4–7.7)
Neutrophils Relative %: 56.4 % (ref 43.0–77.0)
Platelets: 170 10*3/uL (ref 150.0–400.0)
RBC: 5.29 Mil/uL (ref 4.22–5.81)
RDW: 13.3 % (ref 11.5–15.5)
WBC: 5.8 10*3/uL (ref 4.0–10.5)

## 2021-08-05 LAB — HEMOGLOBIN A1C: Hgb A1c MFr Bld: 4.4 % — ABNORMAL LOW (ref 4.6–6.5)

## 2021-08-05 LAB — LDL CHOLESTEROL, DIRECT: Direct LDL: 78 mg/dL

## 2021-08-05 NOTE — Patient Instructions (Signed)

## 2021-08-05 NOTE — Addendum Note (Signed)
Addended by: Jacobo Forest on: 08/05/2021 03:37 PM   Modules accepted: Orders

## 2021-08-05 NOTE — Progress Notes (Signed)
Paul Humphrey 35 y.o.   Chief Complaint  Patient presents with   New Patient (Initial Visit)    HISTORY OF PRESENT ILLNESS: This is a 35 y.o. male first visit to this office, here to establish care with me. Past medical history includes lymphedema of torso. Also had several abdominal surgeries in the past. Family history of colon cancer and colonic polyps. No other complaints or medical concerns today.  HPI   Prior to Admission medications   Medication Sig Start Date End Date Taking? Authorizing Provider  Cholecalciferol (VITAMIN D3 PO) Take by mouth daily.   Yes [provider]  diclofenac Sodium (VOLTAREN) 1 % GEL Apply 4 g topically 4 (four) times daily as needed. 09/13/20  Yes Hilts, Legrand Como, MD  Glucosamine HCl (GLUCOSAMINE PO) Take by mouth 2 (two) times daily.   Yes [provider]  Multiple Vitamins-Minerals (MULTIVITAMIN ADULT PO) Take 1 tablet by mouth daily. Reported on 01/18/2016   Yes [provider]    Allergies  Allergen Reactions   Gadolinium Derivatives Nausea And Vomiting   Lactose Intolerance (Gi) Other (See Comments)    GI UPSET   Percocet [Oxycodone-Acetaminophen] Other (See Comments)    "SKIN CRAWLING" SENSATION   Fentanyl Other (See Comments)    Received in hospital 2015 - "heart rate dropped to 13"    Patient Active Problem List   Diagnosis Date Noted   Class 1 obesity due to excess calories without serious comorbidity with body mass index (BMI) of 33.0 to 33.9 in adult 05/23/2019   Other specified postprocedural states 11/02/2017   Impingement syndrome of shoulder region 09/14/2017   Chronic ethmoidal sinusitis 11/24/2016   Chronic maxillary sinusitis 11/24/2016   Deviated septum 11/24/2016   Pain in both hands 11/23/2016   Great toe pain, left 11/23/2016   GERD (gastroesophageal reflux disease) 03/18/2015   Abdominal pain 01/07/2015   Tingling 01/07/2015   Suspected exposure to heavy metals 01/07/2015    Vitamin D deficiency 01/07/2015   Epigastric pain 10/04/2014    Past Medical History:  Diagnosis Date   Arthritis of big toe    left   Bulging of cervical intervertebral disc    Chicken pox 1992   Chronic ethmoidal sinusitis 12/2016   Chronic maxillary sinusitis 12/2016   Dental crowns present    Deviated nasal septum 12/2016   Family history of adverse reaction to anesthesia    pt's mother has hx. of being hard to wake up post-op   Lip laceration 12/28/2016   no stitches required, per pt.   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    Sinus headache     Past Surgical History:  Procedure Laterality Date   CHOLECYSTECTOMY  2016   COLECTOMY  2013   COLOSTOMY  2013   COLOSTOMY REVERSAL  2014   LEG SURGERY Left 08/12/2005   lateral compartment release   NASAL SEPTOPLASTY W/ TURBINOPLASTY Bilateral 01/04/2017   Procedure: NASAL SEPTOPLASTY WITH TURBINATE REDUCTION;  Surgeon: Izora Gala, MD;  Location: [facility];  Service: ENT;  Laterality: Bilateral;   NASAL SINUS SURGERY Bilateral 01/04/2017   Procedure: ENDOSCOPIC SINUS SURGERY;  Surgeon: Izora Gala, MD;  Location: [facility];  Service: ENT;  Laterality: Bilateral;   SHOULDER SURGERY Bilateral    UMBILICAL HERNIA REPAIR  2016   x 2   URETERAL STENT PLACEMENT  2014    Social History   Socioeconomic History   Marital status: Single  Spouse name: Not on file   Number of children: Not on file   Years of education: Not on file   Highest education level: Not on file  Occupational History   Not on file  Tobacco Use   Smoking status: Never   Smokeless tobacco: Never  Vaping Use   Vaping Use: Never used  Substance and Sexual Activity   Alcohol use: Yes    Alcohol/week: 0.0 standard drinks    Comment: socially   Drug use: No   Sexual activity: Yes  Other Topics Concern   Not on file  Social History Narrative    Lives with parents in a 2 story home.     Has no children.    Works as a Pharmacist, hospital, looking for a job here.    Highest level of education:  MFA   Social Determinants of Health   Financial Resource Strain: Not on file  Food Insecurity: Not on file  Transportation Needs: Not on file  Physical Activity: Not on file  Stress: Not on file  Social Connections: Not on file  Intimate Partner Violence: Not on file    Family History  Problem Relation Age of Onset   Breast cancer Mother    Arthritis Mother    Anesthesia problems Mother        hard to wake up post-op   Arthritis Father    Healthy Brother    Diabetes Maternal Grandmother    Hypertension Maternal Grandmother    Heart disease Maternal Grandmother    Cancer Maternal Grandfather    Colon cancer Maternal Grandfather    Diabetes Paternal Grandmother    Heart disease Paternal Grandmother    Diabetes Paternal Grandfather    Diabetes Paternal Aunt    Colon cancer Paternal Uncle      Review of Systems  Constitutional: Negative.  Negative for chills and fever.  HENT: Negative.  Negative for congestion and sore throat.   Respiratory: Negative.  Negative for cough and shortness of breath.   Cardiovascular: Negative.  Negative for chest pain and palpitations.  Gastrointestinal: Negative.  Negative for abdominal pain, diarrhea, nausea and vomiting.  Genitourinary: Negative.  Negative for dysuria and hematuria.  Skin: Negative.  Negative for rash.  Neurological: Negative.  Negative for dizziness and headaches.  All other systems reviewed and are negative.  Today's Vitals   08/05/21 1423  BP: 118/70  Pulse: 95  Temp: 98.6 F (37 C)  TempSrc: Oral  SpO2: 98%  Weight: 272 lb (123.4 kg)  Height: 6\' 2"  (1.88 m)   Body mass index is 34.92 kg/m.  Physical Exam Vitals reviewed.  Constitutional:      Appearance: Normal appearance.  HENT:     Head: Normocephalic.     Right Ear: Tympanic membrane, ear canal  and external ear normal.     Left Ear: Tympanic membrane, ear canal and external ear normal.     Mouth/Throat:     Mouth: Mucous membranes are moist.     Pharynx: Oropharynx is clear.  Eyes:     Extraocular Movements: Extraocular movements intact.     Conjunctiva/sclera: Conjunctivae normal.     Pupils: Pupils are equal, round, and reactive to light.  Cardiovascular:     Rate and Rhythm: Normal rate and regular rhythm.     Pulses: Normal pulses.     Heart sounds: Normal heart sounds.  Pulmonary:     Effort: Pulmonary effort is normal.     Breath sounds: Normal breath sounds.  Abdominal:     General: Bowel sounds are normal. There is no distension.     Palpations: Abdomen is soft.     Tenderness: There is no abdominal tenderness.  Musculoskeletal:        General: Normal range of motion.     Cervical back: Normal range of motion and neck supple.  Skin:    General: Skin is warm and dry.     Capillary Refill: Capillary refill takes less than 2 seconds.  Neurological:     General: No focal deficit present.     Mental Status: He is alert and oriented to person, place, and time.  Psychiatric:        Mood and Affect: Mood normal.        Behavior: Behavior normal.     ASSESSMENT & PLAN: Problem List Items Addressed This Visit   None Visit Diagnoses     Routine general medical examination at a health care facility    -  Primary   Family history of colon cancer       Relevant Orders   Ambulatory referral to Gastroenterology   Family history of colonic polyps       Relevant Orders   Ambulatory referral to Gastroenterology   Need for hepatitis C screening test       Relevant Orders   Hepatitis C antibody screen   Colon cancer screening       Relevant Orders   Ambulatory referral to Gastroenterology   Screening for deficiency anemia       Relevant Orders   CBC with Differential   Screening for lipoid disorders       Relevant Orders   Lipid panel   Screening for endocrine,  metabolic and immunity disorder       Relevant Orders   Comprehensive metabolic panel   Hemoglobin A1c      Modifiable risk factors discussed with patient. Anticipatory guidance according to age provided. The following topics were also discussed: Social Determinants of Health Smoking.  Non-smoker Diet and nutrition Benefits of exercise Cancer screening and need for colonoscopy given family history of colon cancer and colonic polyps Vaccinations recommendations Cardiovascular risk assessment Mental health including depression and anxiety Fall and accident prevention  Patient Instructions  Health Maintenance, Male Adopting a healthy lifestyle and getting preventive care are important in promoting health and wellness. Ask your health care provider about: The right schedule for you to have regular tests and exams. Things you can do on your own to prevent diseases and keep yourself healthy. What should I know about diet, weight, and exercise? Eat a healthy diet  Eat a diet that includes plenty of vegetables, fruits, low-fat dairy products, and lean protein. Do not eat a lot of foods that are high in solid fats, added sugars, or sodium. Maintain a healthy weight Body mass index (BMI) is a measurement that can be used to identify possible weight problems. It estimates body fat based on height and weight. Your health care provider can help determine your BMI and help you achieve or maintain a healthy weight. Get regular exercise Get regular exercise. This is one of the most important things you can do for your health. Most adults should: Exercise for at least 150 minutes each week. The exercise should increase your heart rate and make you sweat (moderate-intensity exercise). Do strengthening exercises at least twice a week. This is in addition to the moderate-intensity exercise. Spend less time sitting. Even light physical activity can be  beneficial. Watch cholesterol and blood  lipids Have your blood tested for lipids and cholesterol at 33 years of age, then have this test every 5 years. You may need to have your cholesterol levels checked more often if: Your lipid or cholesterol levels are high. You are older than 33 years of age. You are at high risk for heart disease. What should I know about cancer screening? Many types of cancers can be detected early and may often be prevented. Depending on your health history and family history, you may need to have cancer screening at various ages. This may include screening for: Colorectal cancer. Prostate cancer. Skin cancer. Lung cancer. What should I know about heart disease, diabetes, and high blood pressure? Blood pressure and heart disease High blood pressure causes heart disease and increases the risk of stroke. This is more likely to develop in people who have high blood pressure readings or are overweight. Talk with your health care provider about your target blood pressure readings. Have your blood pressure checked: Every 3-5 years if you are 7-60 years of age. Every year if you are 56 years old or older. If you are between the ages of 95 and 52 and are a current or former smoker, ask your health care provider if you should have a one-time screening for abdominal aortic aneurysm (AAA). Diabetes Have regular diabetes screenings. This checks your fasting blood sugar level. Have the screening done: Once every three years after age 7 if you are at a normal weight and have a low risk for diabetes. More often and at a younger age if you are overweight or have a high risk for diabetes. What should I know about preventing infection? Hepatitis B If you have a higher risk for hepatitis B, you should be screened for this virus. Talk with your health care provider to find out if you are at risk for hepatitis B infection. Hepatitis C Blood testing is recommended for: Everyone born from [birth year] through 1963. Anyone with  known risk factors for hepatitis C. Sexually transmitted infections (STIs) You should be screened each year for STIs, including gonorrhea and chlamydia, if: You are sexually active and are younger than 33 years of age. You are older than 33 years of age and your health care provider tells you that you are at risk for this type of infection. Your sexual activity has changed since you were last screened, and you are at increased risk for chlamydia or gonorrhea. Ask your health care provider if you are at risk. Ask your health care provider about whether you are at high risk for HIV. Your health care provider may recommend a prescription medicine to help prevent HIV infection. If you choose to take medicine to prevent HIV, you should first get tested for HIV. You should then be tested every 3 months for as long as you are taking the medicine. Follow these instructions at home: Alcohol use Do not drink alcohol if your health care provider tells you not to drink. If you drink alcohol: Limit how much you have to 0-2 drinks a day. Know how much alcohol is in your drink. In the [place] one drink equals one 12 oz bottle of beer (355 mL), one 5 oz glass of wine (148 mL), or one 1 oz glass of hard liquor (44 mL). Lifestyle Do not use any products that contain nicotine or tobacco. These products include cigarettes, chewing tobacco, and vaping devices, such as e-cigarettes. If you need help quitting, ask your  health care provider. Do not use street drugs. Do not share needles. Ask your health care provider for help if you need support or information about quitting drugs. General instructions Schedule regular health, dental, and eye exams. Stay current with your vaccines. Tell your health care provider if: You often feel depressed. You have ever been abused or do not feel safe at home. Summary Adopting a healthy lifestyle and getting preventive care are important in promoting health and wellness. Follow  your health care provider's instructions about healthy diet, exercising, and getting tested or screened for diseases. Follow your health care provider's instructions on monitoring your cholesterol and blood pressure. This information is not intended to replace advice given to you by your health care provider. Make sure you discuss any questions you have with your health care provider. Document Revised: 12/30/2020 Document Reviewed: 12/30/2020 Elsevier Patient Education  2022 Grant, MD [facility]

## 2021-08-06 LAB — HEPATITIS C ANTIBODY
Hepatitis C Ab: NONREACTIVE
SIGNAL TO CUT-OFF: 0.06 (ref <1.00)

## 2021-08-06 LAB — MONONUCLEOSIS SCREEN: Mono Screen: POSITIVE — AB

## 2021-12-16 ENCOUNTER — Encounter: Payer: Self-pay | Admitting: Gastroenterology

## 2022-01-27 ENCOUNTER — Ambulatory Visit
Admission: RE | Admit: 2022-01-27 | Discharge: 2022-01-27 | Disposition: A | Discharge status: Home / Self Care | Payer: 59 | Source: Ambulatory Visit | Attending: Emergency Medicine | Admitting: Emergency Medicine

## 2022-01-27 ENCOUNTER — Ambulatory Visit (INDEPENDENT_AMBULATORY_CARE_PROVIDER_SITE_OTHER): Payer: 59

## 2022-01-27 VITALS — BP 129/85 | HR 91 | Temp 98.0°F | Resp 18

## 2022-01-27 DIAGNOSIS — R0602 Shortness of breath: Secondary | ICD-10-CM

## 2022-01-27 DIAGNOSIS — K439 Ventral hernia without obstruction or gangrene: Secondary | ICD-10-CM

## 2022-01-27 NOTE — Discharge Instructions (Addendum)
Follow up with general surgery as soon as  you are able.   Follow up with your primary care provider about feeling short of breath. Watch for other symptoms, such as fever, cough, or worsening shortness of breath. If you develop severe symptoms, seek emergency care.

## 2022-01-27 NOTE — ED Triage Notes (Signed)
Pt here with a 2 day hx of an area bulging in epigastric region when sitting up from a laying position. The bulge feels like an air pocket, as described by patient, that is painless and goes back in once sitting up.

## 2022-01-27 NOTE — ED Provider Notes (Signed)
[facility]    CSN: [id number] Arrival date & time: 01/27/22  5573      History   Chief Complaint Chief Complaint  Patient presents with   Possible Hernia    HPI Paul Humphrey is a 36 y.o. male. Pt noticed bulge in abd when using abd muscles for the last 2 days. Worried he has a hernia. This is not painful or bothersome. Also feels like his LUQ of his abd is "tight" like when his spleen was enlarged last year when he had mono. Does not feel sick, denies fever or chills.  No N/V. Also reports feeling a little short of breath for a few days; he has a pulse oximeter at home and when he checked it, his sats where 96% when they are normally 99%. Denies fever or chillls. Worried he may have a spontaneous pneumothorax, wants a cxr. Denies cough, sputum, congestion.   HPI  Past Medical History:  Diagnosis Date   Arthritis of big toe    left   Bulging of cervical intervertebral disc    Chicken pox 1992   Chronic ethmoidal sinusitis 12/2016   Chronic maxillary sinusitis 12/2016   Dental crowns present    Deviated nasal septum 12/2016   Family history of adverse reaction to anesthesia    pt's mother has hx. of being hard to wake up post-op   Lip laceration 12/28/2016   no stitches required, per pt.   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    Sinus headache     Patient Active Problem List   Diagnosis Date Noted   Class 1 obesity due to excess calories without serious comorbidity with body mass index (BMI) of 33.0 to 33.9 in adult 05/23/2019   Other specified postprocedural states 11/02/2017   Impingement syndrome of shoulder region 09/14/2017   Chronic ethmoidal sinusitis 11/24/2016   Chronic maxillary sinusitis 11/24/2016   Deviated septum 11/24/2016   Pain in both hands 11/23/2016   Great toe pain, left 11/23/2016   GERD (gastroesophageal reflux disease) 03/18/2015   Abdominal pain 01/07/2015   Tingling 01/07/2015   Suspected exposure to  heavy metals 01/07/2015   Vitamin D deficiency 01/07/2015   Epigastric pain 10/04/2014    Past Surgical History:  Procedure Laterality Date   CHOLECYSTECTOMY  2016   COLECTOMY  2013   COLOSTOMY  2013   COLOSTOMY REVERSAL  2014   LEG SURGERY Left 08/12/2005   lateral compartment release   NASAL SEPTOPLASTY W/ TURBINOPLASTY Bilateral 01/04/2017   Procedure: NASAL SEPTOPLASTY WITH TURBINATE REDUCTION;  Surgeon: Izora Gala, MD;  Location: [facility];  Service: ENT;  Laterality: Bilateral;   NASAL SINUS SURGERY Bilateral 01/04/2017   Procedure: ENDOSCOPIC SINUS SURGERY;  Surgeon: Izora Gala, MD;  Location: [facility];  Service: ENT;  Laterality: Bilateral;   SHOULDER SURGERY Bilateral    UMBILICAL HERNIA REPAIR  2016   x 2   URETERAL STENT PLACEMENT  2014       Home Medications    Prior to Admission medications   Medication Sig Start Date End Date Taking? Authorizing Provider  Cholecalciferol (VITAMIN D3 PO) Take by mouth daily.    [provider]  diclofenac Sodium (VOLTAREN) 1 % GEL Apply 4 g topically 4 (four) times daily as needed. 09/13/20   Hilts, Legrand Como, MD  Glucosamine HCl (GLUCOSAMINE PO) Take by mouth 2 (two) times daily.    [provider]  Multiple Vitamins-Minerals (MULTIVITAMIN ADULT PO)  Take 1 tablet by mouth daily. Reported on 01/18/2016    [provider]    Family History Family History  Problem Relation Age of Onset   Breast cancer Mother    Arthritis Mother    Anesthesia problems Mother        hard to wake up post-op   Arthritis Father    Healthy Brother    Diabetes Maternal Grandmother    Hypertension Maternal Grandmother    Heart disease Maternal Grandmother    Cancer Maternal Grandfather    Colon cancer Maternal Grandfather    Diabetes Paternal Grandmother    Heart disease Paternal Grandmother    Diabetes Paternal Grandfather    Diabetes Paternal Aunt    Colon cancer Paternal Uncle      Social History Social History   Tobacco Use   Smoking status: Never   Smokeless tobacco: Never  Vaping Use   Vaping Use: Never used  Substance Use Topics   Alcohol use: Yes    Alcohol/week: 0.0 standard drinks    Comment: socially   Drug use: No     Allergies   Gadolinium derivatives, Lactose intolerance (gi), Percocet [oxycodone-acetaminophen], and Fentanyl   Review of Systems Review of Systems   Physical Exam Triage Vital Signs ED Triage Vitals  Enc Vitals Group     BP 01/27/22 1000 129/85     Pulse Rate 01/27/22 1000 91     Resp 01/27/22 1004 18     Temp 01/27/22 1000 98 F (36.7 C)     Temp src --      SpO2 01/27/22 1000 98 %     Weight --      Height --      Head Circumference --      Peak Flow --      Pain Score 01/27/22 1004 0     Pain Loc --      Pain Edu? --      Excl. in [place]? --    No data found.  Updated Vital Signs BP 129/85   Pulse 91   Temp 98 F (36.7 C)   Resp 18   SpO2 98%   Visual Acuity Right Eye Distance:   Left Eye Distance:   Bilateral Distance:    Right Eye Near:   Left Eye Near:    Bilateral Near:     Physical Exam Constitutional:      General: He is not in acute distress.    Appearance: Normal appearance. He is not ill-appearing.  Cardiovascular:     Rate and Rhythm: Normal rate and regular rhythm.  Pulmonary:     Effort: Pulmonary effort is normal.     Breath sounds: Normal breath sounds.  Abdominal:     General: Abdomen is flat.     Palpations: Abdomen is soft.     Tenderness: There is no abdominal tenderness.     Hernia: A hernia is present. Hernia is present in the ventral area.     Comments: Ventral hernia without incarceration. Unable to palpate spleen. Abd soft and nontender  Neurological:     Mental Status: He is alert.     UC Treatments / Results  Labs (all labs ordered are listed, but only abnormal results are displayed) Labs Reviewed - No data to display  EKG   Radiology DG Chest 2  View  Result Date: 01/27/2022 CLINICAL DATA:  Shortness of breath. EXAM: CHEST - 2 VIEW COMPARISON:  January 22, 2021 FINDINGS: The heart  size and mediastinal contours are within normal limits. Both lungs are clear. The visualized skeletal structures are unremarkable. IMPRESSION: No active cardiopulmonary disease. Electronically Signed   By: Fidela Salisbury M.D.   On: 01/27/2022 10:43    Procedures Procedures (including critical care time)  Medications Ordered in UC Medications - No data to display  Initial Impression / Assessment and Plan / UC Course  I have reviewed the triage vital signs and the nursing notes.  Pertinent labs & imaging results that were available during my care of the patient were reviewed by me and considered in my medical decision making (see chart for details).    CXR normal, reassured pt he does not have ptx. Reviewed reasons for seeking higher level of care/further care.   Does have ventral hernia - no evidence of incarceration, reduces when relaxes abd. Given contact info for gen surgery.   Final Clinical Impressions(s) / UC Diagnoses   Final diagnoses:  Shortness of breath  Ventral hernia without obstruction or gangrene     Discharge Instructions      Follow up with general surgery as soon as  you are able.   Follow up with your primary care provider about feeling short of breath. Watch for other symptoms, such as fever, cough, or worsening shortness of breath. If you develop severe symptoms, seek emergency care.    ED Prescriptions   None    PDMP not reviewed this encounter.   Carvel Getting, NP 01/27/22 1205

## 2022-02-10 ENCOUNTER — Encounter: Payer: Self-pay | Admitting: Gastroenterology

## 2022-02-10 ENCOUNTER — Ambulatory Visit (INDEPENDENT_AMBULATORY_CARE_PROVIDER_SITE_OTHER): Payer: 59 | Admitting: Gastroenterology

## 2022-02-10 VITALS — BP 140/90 | HR 103 | Ht 74.0 in | Wt 280.0 lb

## 2022-02-10 DIAGNOSIS — Z8371 Family history of colonic polyps: Secondary | ICD-10-CM

## 2022-02-10 DIAGNOSIS — K649 Unspecified hemorrhoids: Secondary | ICD-10-CM

## 2022-02-10 DIAGNOSIS — Z800 Family history of malignant neoplasm of digestive organs: Secondary | ICD-10-CM

## 2022-02-10 DIAGNOSIS — K625 Hemorrhage of anus and rectum: Secondary | ICD-10-CM | POA: Diagnosis not present

## 2022-02-10 DIAGNOSIS — K5909 Other constipation: Secondary | ICD-10-CM | POA: Diagnosis not present

## 2022-02-10 MED ORDER — NA SULFATE-K SULFATE-MG SULF 17.5-3.13-1.6 GM/177ML PO SOLN: 1.0000 | ORAL | 0 refills | Status: DC

## 2022-02-10 NOTE — Progress Notes (Signed)
GASTROENTEROLOGY OUTPATIENT CLINIC VISIT   Primary Care Provider Georgina Quint, MD [address] [place] [address] [phone]  Referring Provider Lavada Mesi, MD [address] [place],  [place] [address] [phone]  Patient Profile: Paul Humphrey is a 36 y.o. male with a pmh significant for OA, Cervical Disc disease, FHx Colon Advanced Polyps (Father), FHx Colon Cancer (multiple 2nd degree relatives earliest in young 50s), s/p Partial colectomy with Colostomy for short time before reversal, Diverticulosis with prior Diverticulitis, s/p CCK.  The patient presents to the [facility] for an evaluation and management of problem(s) noted below:  Problem List 1. Rectal bleeding   2. Other constipation   3. Hemorrhoids, unspecified hemorrhoid type   4. Family history of colonic polyps   5. Family history of colon cancer     History of Present Illness This is the patient's first visit to the outpatient [facility].  The patient states that from his teenage years he has dealt with issues of GI problems.  He has had diverticulosis and possible diverticulitis flares.  He has a history of cholecystectomy for abdominal pain issues that did not solve his problems years ago (was experiencing unintentional weight loss).  He makes mention of issues surrounding fracking that could have played some issues around that time.  He has a history of a trauma that led to accidental sigmoid colon perforation/injury that required a colostomy for a period in time and subsequent reversal in 2013/2014.  More recently he has been experiencing rectal bleeding which usually occurs around the time of significant constipation (that may require manual disimpaction at times).  The patient has never had a colonoscopy or upper endoscopy.  The patient's family history is significant for his father having advanced adenomas (he is a patient of mine) as well as multiple  second-degree relatives having colon cancer including one in their early 69s.  GI Review of Systems Positive as above Negative for dysphagia, odynophagia, nausea, vomiting, bloating, melena  Review of Systems General: Denies fevers/chills/unintentional weight loss HEENT: Denies oral lesions Cardiovascular: Denies chest pain Pulmonary: Denies shortness of breath Gastroenterological: See HPI Genitourinary: Denies darkened urine Hematological: Denies easy bruising/bleeding Endocrine: Denies temperature intolerance Dermatological: Denies jaundice Psychological: Mood is stable   Medications Current Outpatient Medications  Medication Sig Dispense Refill   Cholecalciferol (VITAMIN D3 PO) Take by mouth daily.     diclofenac Sodium (VOLTAREN) 1 % GEL Apply 4 g topically 4 (four) times daily as needed. 500 g 6   Glucosamine HCl (GLUCOSAMINE PO) Take by mouth 2 (two) times daily.     Multiple Vitamins-Minerals (MULTIVITAMIN ADULT PO) Take 1 tablet by mouth daily. Reported on 01/18/2016     Na Sulfate-K Sulfate-Mg Sulf (SUPREP BOWEL PREP KIT) 17.5-3.13-1.6 GM/177ML SOLN Take 1 kit by mouth as directed. For colonoscopy prep 354 mL 0   No current facility-administered medications for this visit.    Allergies Allergies  Allergen Reactions   Gadolinium Derivatives Nausea And Vomiting   Lactose Intolerance (Gi) Other (See Comments)    GI UPSET   Percocet [Oxycodone-Acetaminophen] Other (See Comments)    "SKIN CRAWLING" SENSATION   Fentanyl Other (See Comments)    Received in hospital 2015 - "heart rate dropped to 13"    Histories Past Medical History:  Diagnosis Date   Arthritis of big toe    left   Bulging of cervical intervertebral disc    Chicken pox 1992   Chronic ethmoidal sinusitis 12/2016  Chronic maxillary sinusitis 12/2016   Dental crowns present    Deviated nasal septum 12/2016   Family history of adverse reaction to anesthesia    pt's mother has hx. of being hard to  wake up post-op   Lip laceration 12/28/2016   no stitches required, per pt.   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    Sinus headache    Past Surgical History:  Procedure Laterality Date   CHOLECYSTECTOMY  2016   COLECTOMY  2013   COLOSTOMY  2013   COLOSTOMY REVERSAL  2014   LEG SURGERY Left 08/12/2005   lateral compartment release   NASAL SEPTOPLASTY W/ TURBINOPLASTY Bilateral 01/04/2017   Procedure: NASAL SEPTOPLASTY WITH TURBINATE REDUCTION;  Surgeon: Serena Colonel, MD;  Location: [facility];  Service: ENT;  Laterality: Bilateral;   NASAL SINUS SURGERY Bilateral 01/04/2017   Procedure: ENDOSCOPIC SINUS SURGERY;  Surgeon: Serena Colonel, MD;  Location: [facility];  Service: ENT;  Laterality: Bilateral;   SHOULDER SURGERY Bilateral    UMBILICAL HERNIA REPAIR  2016   x 2   URETERAL STENT PLACEMENT  2014   Social History   Socioeconomic History   Marital status: Single    Spouse name: Not on file   Number of children: Not on file   Years of education: Not on file   Highest education level: Not on file  Occupational History   Not on file  Tobacco Use   Smoking status: Never   Smokeless tobacco: Never  Vaping Use   Vaping Use: Never used  Substance and Sexual Activity   Alcohol use: Yes    Alcohol/week: 0.0 standard drinks of alcohol    Comment: socially   Drug use: No   Sexual activity: Yes  Other Topics Concern   Not on file  Social History Narrative   Lives with parents in a 2 story home.     Has no children.    Works as a Runner, broadcasting/film/video, looking for a job here.    Highest level of education:  MFA   Social Determinants of Health   Financial Resource Strain: Not on file  Food Insecurity: Not on file  Transportation Needs: Not on file  Physical Activity: Not on file  Stress: Not on file  Social Connections: Not on file  Intimate Partner Violence: Not on file   Family History  Problem Relation Age of  Onset   Cancer Mother        ?appendiceal cancer vs Cecal Cancer   Breast cancer Mother    Arthritis Mother    Anesthesia problems Mother        hard to wake up post-op   Arthritis Father    Healthy Brother    Diabetes Paternal Aunt    Colon cancer Paternal Uncle    Diabetes Maternal Grandmother    Hypertension Maternal Grandmother    Heart disease Maternal Grandmother    Cancer Maternal Grandfather    Colon cancer Maternal Grandfather    Diabetes Paternal Grandmother    Heart disease Paternal Grandmother    Diabetes Paternal Grandfather    Esophageal cancer Neg Hx    Inflammatory bowel disease Neg Hx    Liver disease Neg Hx    Pancreatic cancer Neg Hx    Stomach cancer Neg Hx    I have reviewed his medical, social, and family history in detail and updated the electronic medical record as necessary.    PHYSICAL EXAMINATION  BP 140/90   Pulse (!) 103   Ht 6\' 2"  (1.88 m)   Wt 280 lb (127 kg)   BMI 35.95 kg/m  Wt Readings from Last 3 Encounters:  02/10/22 280 lb (127 kg)  08/05/21 272 lb (123.4 kg)  01/22/21 267 lb 6.4 oz (121.3 kg)  GEN: NAD, appears stated age, doesn't appear chronically ill PSYCH: Cooperative, without pressured speech EYE: Conjunctivae pink, sclerae anicteric ENT: MMM CV: Nontachycardic RESP: No audible wheezing GI: NABS, soft, protuberant abdomen, ventral diastases present, surgical scars present, nontender, hepatosplenomegaly difficult to appreciate due to body habitus GU: DRE shows evidence of internal and external hemorrhoidal disease without any anal fissures and no palpable lesions MSK/EXT: No lower extremity edema SKIN: No jaundice NEURO:  Alert & Oriented x 3, no focal deficits   REVIEW OF DATA  I reviewed the following data at the time of this encounter:  GI Procedures and Studies  No relevant studies to review  Laboratory Studies  Reviewed those in epic  Imaging Studies  No imaging studies to review   ASSESSMENT  Mr. Barba  is a 36 y.o. male with a pmh significant for OA, Cervical Disc disease, FHx Colon Advanced Polyps (Father), FHx Colon Cancer (multiple 2nd degree relatives earliest in young 72s), s/p Partial colectomy with Colostomy for short time before reversal, Diverticulosis with prior Diverticulitis, s/p CCK.  The patient is seen today for evaluation and management of:  1. Rectal bleeding   2. Other constipation   3. Hemorrhoids, unspecified hemorrhoid type   4. Family history of colonic polyps   5. Family history of colon cancer    The patient is hemodynamically stable.  Overall from a clinical standpoint his symptoms have been longstanding in nature.  He will need optimization of his bowel habits.  Toileting techniques were discussed with the patient and will be offered to him as well.  Due to the patient's family history of advanced adenomas as well as colon cancer in multiple second-degree relatives, he is, and would be considered, a high risk individual.  He has had some rectal bleeding which likely will end up being hemorrhoidal in nature as result of his constipation issues.  However a high risk colon cancer screening colonoscopy will help delve and carve out any other issues that could be a role.  I recommend that he remain on a 5-year colonoscopy recall plan, even if his colon is normal due to his significant family history.  We need to optimize his fiber intake as well.  The risks and benefits of endoscopic evaluation were discussed with the patient; these include but are not limited to the risk of perforation, infection, bleeding, missed lesions, lack of diagnosis, severe illness requiring hospitalization, as well as anesthesia and sedation related illnesses.  The patient and/or family is agreeable to proceed.   All patient questions were answered to the best of my ability, and the patient agrees to the aforementioned plan of action with follow-up as indicated.   PLAN  Toileting techniques  discussed Increase fiber intake (FiberCon or Metamucil or Benefiber daily to twice daily depending on how patient tolerates the when he chooses) High risk colon cancer screening to be scheduled which will also help delve/clear up some other issues at play Consider Anusol suppositories   Orders Placed This Encounter  Procedures   Ambulatory referral to Gastroenterology    New Prescriptions   NA SULFATE-K SULFATE-MG SULF (SUPREP BOWEL PREP KIT) 17.5-3.13-1.6 GM/177ML SOLN    Take 1 kit  by mouth as directed. For colonoscopy prep   Modified Medications   No medications on file    Planned Follow Up No follow-ups on file.   Total Time in Face-to-Face and in Coordination of Care for patient including independent/personal interpretation/review of prior testing, medical history, examination, medication adjustment, communicating results with the patient directly, and documentation within the EHR is 45 minutes.   Corliss Parish, MD [facility] Advanced [facility]oscopy Office # [phone]

## 2022-02-10 NOTE — Patient Instructions (Signed)
If you are age 34 or older, your body mass index should be between 23-30. Your Body mass index is 35.95 kg/m. If this is out of the aforementioned range listed, please consider follow up with your Primary Care Provider.  If you are age 72 or younger, your body mass index should be between 19-25. Your Body mass index is 35.95 kg/m. If this is out of the aformentioned range listed, please consider follow up with your Primary Care Provider.   ________________________________________________________  The [facility] providers would like to encourage you to use [facility] to communicate with providers for non-urgent requests or questions.  Due to long hold times on the telephone, sending your provider a message by [facility] may be a faster and more efficient way to get a response.  Please allow 48 business hours for a response.  Please remember that this is for non-urgent requests.  _______________________________________________________  We have sent the following medications to your pharmacy for you to pick up at your convenience: Suprep   Thank you for choosing me and [facility].  Dr. Rush Landmark

## 2022-02-14 ENCOUNTER — Encounter: Payer: Self-pay | Admitting: Gastroenterology

## 2022-02-14 DIAGNOSIS — K649 Unspecified hemorrhoids: Secondary | ICD-10-CM | POA: Insufficient documentation

## 2022-02-14 DIAGNOSIS — K5909 Other constipation: Secondary | ICD-10-CM | POA: Insufficient documentation

## 2022-02-14 DIAGNOSIS — K625 Hemorrhage of anus and rectum: Secondary | ICD-10-CM | POA: Insufficient documentation

## 2022-02-14 DIAGNOSIS — Z800 Family history of malignant neoplasm of digestive organs: Secondary | ICD-10-CM | POA: Insufficient documentation

## 2022-02-14 DIAGNOSIS — Z8371 Family history of colonic polyps: Secondary | ICD-10-CM | POA: Insufficient documentation

## 2022-03-03 ENCOUNTER — Encounter: Payer: Self-pay | Admitting: Gastroenterology

## 2022-03-03 ENCOUNTER — Ambulatory Visit (AMBULATORY_SURGERY_CENTER): Payer: 59 | Admitting: Gastroenterology

## 2022-03-03 VITALS — BP 133/88 | HR 71 | Temp 98.9°F | Resp 13 | Ht 74.0 in | Wt 280.0 lb

## 2022-03-03 DIAGNOSIS — K5140 Inflammatory polyps of colon without complications: Secondary | ICD-10-CM | POA: Diagnosis not present

## 2022-03-03 DIAGNOSIS — K625 Hemorrhage of anus and rectum: Secondary | ICD-10-CM

## 2022-03-03 DIAGNOSIS — K51411 Inflammatory polyps of colon with rectal bleeding: Secondary | ICD-10-CM | POA: Diagnosis not present

## 2022-03-03 DIAGNOSIS — D124 Benign neoplasm of descending colon: Secondary | ICD-10-CM

## 2022-03-03 DIAGNOSIS — Z1211 Encounter for screening for malignant neoplasm of colon: Secondary | ICD-10-CM

## 2022-03-03 DIAGNOSIS — K621 Rectal polyp: Secondary | ICD-10-CM | POA: Diagnosis not present

## 2022-03-03 DIAGNOSIS — Z800 Family history of malignant neoplasm of digestive organs: Secondary | ICD-10-CM | POA: Diagnosis not present

## 2022-03-03 DIAGNOSIS — D128 Benign neoplasm of rectum: Secondary | ICD-10-CM

## 2022-03-03 MED ORDER — SODIUM CHLORIDE 0.9 % IV SOLN: 500.0000 mL | Freq: Once | INTRAVENOUS | Status: DC

## 2022-03-03 NOTE — Op Note (Signed)
[facility] Patient Name: Paul Humphrey Procedure Date: 03/03/2022 2:50 PM MRN: [id number] Endoscopist: Justice Britain , MD Age: 36 Referring MD:  Date of Birth: Jul 07, 1986 Gender: Male Account #: [id number] Procedure:                Colonoscopy Indications:              Colon cancer screening in patient with 1st-degree                            relative having advanced adenoma of the colon,                            Screening in patient at increased risk: Colorectal                            cancer vs Appendiceal Cancer in mother before age                            18 (she had colon resection at this time) Medicines:                Monitored Anesthesia Care Procedure:                Pre-Anesthesia Assessment:                           - Prior to the procedure, a History and Physical                            was performed, and patient medications and                            allergies were reviewed. The patient's tolerance of                            previous anesthesia was also reviewed. The risks                            and benefits of the procedure and the sedation                            options and risks were discussed with the patient.                            All questions were answered, and informed consent                            was obtained. Prior Anticoagulants: The patient has                            taken no previous anticoagulant or antiplatelet                            agents. ASA Grade Assessment: II - A patient with  mild systemic disease. After reviewing the risks                            and benefits, the patient was deemed in                            satisfactory condition to undergo the procedure.                           After obtaining informed consent, the colonoscope                            was passed under direct vision. Throughout the                            procedure, the patient's  blood pressure, pulse, and                            oxygen saturations were monitored continuously. The                            Olympus Scope [id number] was introduced through the                            anus and advanced to the the cecum, identified by                            appendiceal orifice and ileocecal valve. The                            colonoscopy was somewhat difficult due to                            significant looping. Successful completion of the                            procedure was aided by changing the patient's                            position, using manual pressure, straightening and                            shortening the scope to obtain bowel loop reduction                            and using scope torsion. The patient tolerated the                            procedure. The quality of the bowel preparation was                            adequate. The ileocecal valve, appendiceal orifice,  and rectum were photographed. Scope In: 3:14:12 PM Scope Out: 3:34:39 PM Scope Withdrawal Time: 0 hours 15 minutes 5 seconds  Total Procedure Duration: 0 hours 20 minutes 27 seconds  Findings:                 The digital rectal exam findings include                            hemorrhoids. Pertinent negatives include no                            palpable rectal lesions.                           The colon (entire examined portion) revealed                            grossly excessive looping.                           Three sessile polyps were found in the rectum and                            descending colon. The polyps were 4 to 10 mm in                            size. These polyps were removed with a cold snare.                            Resection and retrieval were complete.                           There was evidence of a prior end-to-end                            colo-colonic anastomosis in the recto-sigmoid                             colon. This was patent and was characterized by                            healthy appearing mucosa. The anastomosis was                            traversed.                           Normal mucosa was found in the entire colon                            otherwise.                           Non-bleeding non-thrombosed external and internal  hemorrhoids were found during retroflexion, during                            perianal exam and during digital exam. The                            hemorrhoids were Grade II (internal hemorrhoids                            that prolapse but reduce spontaneously). Complications:            No immediate complications. Estimated Blood Loss:     Estimated blood loss was minimal. Impression:               - Hemorrhoids found on digital rectal exam.                           - There was significant looping of the colon.                           - Three 4 to 10 mm polyps in the rectum and in the                            descending colon, removed with a cold snare.                            Resected and retrieved.                           - Patent end-to-end colo-colonic anastomosis,                            characterized by healthy appearing mucosa.                           - Normal mucosa in the entire examined colon                            otherwise.                           - Non-bleeding non-thrombosed external and internal                            hemorrhoids. Recommendation:           - The patient will be observed post-procedure,                            until all discharge criteria are met.                           - Discharge patient to home.                           - Patient has a contact number available for  emergencies. The signs and symptoms of potential                            delayed complications were discussed with the                            patient.  Return to normal activities tomorrow.                            Written discharge instructions were provided to the                            patient.                           - High fiber diet.                           - Use FiberCon 1-2 tablets PO daily.                           - Continue present medications.                           - Await pathology results.                           - Repeat colonoscopy in 3 years for surveillance.                            Due to family history should be at least every                            5-years however at maximum interval.                           - The findings and recommendations were discussed                            with the patient.                           - The findings and recommendations were discussed                            with the patient's family. Justice Britain, MD 03/03/2022 3:42:09 PM

## 2022-03-03 NOTE — Patient Instructions (Signed)
YOU HAD AN ENDOSCOPIC PROCEDURE TODAY AT THE [facility]:   Refer to the procedure report that was given to you for any specific questions about what was found during the examination.  If the procedure report does not answer your questions, please call your gastroenterologist to clarify.  If you requested that your care partner not be given the details of your procedure findings, then the procedure report has been included in a sealed envelope for you to review at your convenience later.  YOU SHOULD EXPECT: Some feelings of bloating in the abdomen. Passage of more gas than usual.  Walking can help get rid of the air that was put into your GI tract during the procedure and reduce the bloating. If you had a lower endoscopy (such as a colonoscopy or flexible sigmoidoscopy) you may notice spotting of blood in your stool or on the toilet paper. If you underwent a bowel prep for your procedure, you may not have a normal bowel movement for a few days.  Please Note:  You might notice some irritation and congestion in your nose or some drainage.  This is from the oxygen used during your procedure.  There is no need for concern and it should clear up in a day or so.  SYMPTOMS TO REPORT IMMEDIATELY:  Following lower endoscopy (colonoscopy or flexible sigmoidoscopy):  Excessive amounts of blood in the stool  Significant tenderness or worsening of abdominal pains  Swelling of the abdomen that is new, acute  Fever of 100F or higher  Following upper endoscopy (EGD)  Vomiting of blood or coffee ground material  New chest pain or pain under the shoulder blades  Painful or persistently difficult swallowing  New shortness of breath  Fever of 100F or higher  Black, tarry-looking stools  For urgent or emergent issues, a gastroenterologist can be reached at any hour by calling [phone]. Do not use MyChart messaging for urgent concerns.    DIET:  We do recommend a small meal at first, but  then you may proceed to your regular diet.  Drink plenty of fluids but you should avoid alcoholic beverages for 24 hours.  ACTIVITY:  You should plan to take it easy for the rest of today and you should NOT DRIVE or use heavy machinery until tomorrow (because of the sedation medicines used during the test).    FOLLOW UP: Our staff will call the number listed on your records the next business day following your procedure.  We will call around 7:15- 8:00 am to check on you and address any questions or concerns that you may have regarding the information given to you following your procedure. If we do not reach you, we will leave a message.  If you develop any symptoms (ie: fever, flu-like symptoms, shortness of breath, cough etc.) before then, please call [phone].  If you test positive for Covid 19 in the 2 weeks post procedure, please call and report this information to us.    If any biopsies were taken you will be contacted by phone or by letter within the next 1-3 weeks.  Please call us at [phone] if you have not heard about the biopsies in 3 weeks.    SIGNATURES/CONFIDENTIALITY: You and/or your care partner have signed paperwork which will be entered into your electronic medical record.  These signatures attest to the fact that that the information above on your After Visit Summary has been reviewed and is understood.  Full responsibility of the confidentiality   of this discharge information lies with you and/or your care-partner.  

## 2022-03-03 NOTE — Progress Notes (Signed)
GASTROENTEROLOGY PROCEDURE H&P NOTE   Primary Care Physician: Horald Pollen, MD  HPI: Paul Humphrey is a 36 y.o. male who presents for Colonoscopy for high risk screening (FHx colon cancer and advanced colon polyps).  Past Medical History:  Diagnosis Date   Arthritis of big toe    left   Bulging of cervical intervertebral disc    Chicken pox 1992   Chronic ethmoidal sinusitis 12/2016   Chronic maxillary sinusitis 12/2016   Dental crowns present    Deviated nasal septum 12/2016   Family history of adverse reaction to anesthesia    pt's mother has hx. of being hard to wake up post-op   Lip laceration 12/28/2016   no stitches required, per pt.   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    Sinus headache    Past Surgical History:  Procedure Laterality Date   CHOLECYSTECTOMY  2016   COLECTOMY  2013   COLOSTOMY  2013   COLOSTOMY REVERSAL  2014   LEG SURGERY Left 08/12/2005   lateral compartment release   NASAL SEPTOPLASTY W/ TURBINOPLASTY Bilateral 01/04/2017   Procedure: NASAL SEPTOPLASTY WITH TURBINATE REDUCTION;  Surgeon: Izora Gala, MD;  Location: [place];  Service: ENT;  Laterality: Bilateral;   NASAL SINUS SURGERY Bilateral 01/04/2017   Procedure: ENDOSCOPIC SINUS SURGERY;  Surgeon: Izora Gala, MD;  Location: [facility];  Service: ENT;  Laterality: Bilateral;   SHOULDER SURGERY Bilateral    UMBILICAL HERNIA REPAIR  2016   x 2   URETERAL STENT PLACEMENT  2014   Current Outpatient Medications  Medication Sig Dispense Refill   Cholecalciferol (VITAMIN D3 PO) Take by mouth daily.     diclofenac Sodium (VOLTAREN) 1 % GEL Apply 4 g topically 4 (four) times daily as needed. 500 g 6   Glucosamine HCl (GLUCOSAMINE PO) Take by mouth 2 (two) times daily.     Multiple Vitamins-Minerals (MULTIVITAMIN ADULT PO) Take 1 tablet by mouth daily. Reported on 01/18/2016     Na Sulfate-K Sulfate-Mg Sulf (SUPREP BOWEL  PREP KIT) 17.5-3.13-1.6 GM/177ML SOLN Take 1 kit by mouth as directed. For colonoscopy prep 354 mL 0   No current facility-administered medications for this visit.    Current Outpatient Medications:    Cholecalciferol (VITAMIN D3 PO), Take by mouth daily., Disp: , Rfl:    diclofenac Sodium (VOLTAREN) 1 % GEL, Apply 4 g topically 4 (four) times daily as needed., Disp: 500 g, Rfl: 6   Glucosamine HCl (GLUCOSAMINE PO), Take by mouth 2 (two) times daily., Disp: , Rfl:    Multiple Vitamins-Minerals (MULTIVITAMIN ADULT PO), Take 1 tablet by mouth daily. Reported on 01/18/2016, Disp: , Rfl:    Na Sulfate-K Sulfate-Mg Sulf (SUPREP BOWEL PREP KIT) 17.5-3.13-1.6 GM/177ML SOLN, Take 1 kit by mouth as directed. For colonoscopy prep, Disp: 354 mL, Rfl: 0 Allergies  Allergen Reactions   Gadolinium Derivatives Nausea And Vomiting   Lactose Intolerance (Gi) Other (See Comments)    GI UPSET   Percocet [Oxycodone-Acetaminophen] Other (See Comments)    "SKIN CRAWLING" SENSATION   Fentanyl Other (See Comments)    Received in hospital 2015 - "heart rate dropped to 13"   Family History  Problem Relation Age of Onset   Cancer Mother        ?appendiceal cancer vs Cecal Cancer   Breast cancer Mother    Arthritis Mother    Anesthesia problems Mother        hard  to wake up post-op   Arthritis Father    Healthy Brother    Diabetes Paternal Aunt    Colon cancer Paternal Uncle    Diabetes Maternal Grandmother    Hypertension Maternal Grandmother    Heart disease Maternal Grandmother    Cancer Maternal Grandfather    Colon cancer Maternal Grandfather    Diabetes Paternal Grandmother    Heart disease Paternal Grandmother    Diabetes Paternal Grandfather    Esophageal cancer Neg Hx    Inflammatory bowel disease Neg Hx    Liver disease Neg Hx    Pancreatic cancer Neg Hx    Stomach cancer Neg Hx    Social History   Socioeconomic History   Marital status: Single    Spouse name: Not on file   Number of  children: Not on file   Years of education: Not on file   Highest education level: Not on file  Occupational History   Not on file  Tobacco Use   Smoking status: Never   Smokeless tobacco: Never  Vaping Use   Vaping Use: Never used  Substance and Sexual Activity   Alcohol use: Yes    Alcohol/week: 0.0 standard drinks of alcohol    Comment: socially   Drug use: No   Sexual activity: Yes  Other Topics Concern   Not on file  Social History Narrative   Lives with parents in a 2 story home.     Has no children.    Works as a Pharmacist, hospital, looking for a job here.    Highest level of education:  MFA   Social Determinants of Health   Financial Resource Strain: Not on file  Food Insecurity: Not on file  Transportation Needs: Not on file  Physical Activity: Not on file  Stress: Not on file  Social Connections: Not on file  Intimate Partner Violence: Not on file    Physical Exam: There were no vitals filed for this visit. There is no height or weight on file to calculate BMI. GEN: NAD EYE: Sclerae anicteric ENT: MMM CV: Non-tachycardic GI: Soft, NT/ND NEURO:  Alert & Oriented x 3  Lab Results: No results for input(s): "WBC", "HGB", "HCT", "PLT" in the last 72 hours. BMET No results for input(s): "NA", "K", "CL", "CO2", "GLUCOSE", "BUN", "CREATININE", "CALCIUM" in the last 72 hours. LFT No results for input(s): "PROT", "ALBUMIN", "AST", "ALT", "ALKPHOS", "BILITOT", "BILIDIR", "IBILI" in the last 72 hours. PT/INR No results for input(s): "LABPROT", "INR" in the last 72 hours.   Impression / Plan: This is a 36 y.o.male who presents for Colonoscopy for high risk screening (FHx colon cancer and advanced colon polyps).  The risks and benefits of endoscopic evaluation/treatment were discussed with the patient and/or family; these include but are not limited to the risk of perforation, infection, bleeding, missed lesions, lack of diagnosis, severe illness requiring hospitalization,  as well as anesthesia and sedation related illnesses.  The patient's history has been reviewed, patient examined, no change in status, and deemed stable for procedure.  The patient and/or family is agreeable to proceed.    Justice Britain, MD [facility]oscopy Office # [phone]

## 2022-03-03 NOTE — Progress Notes (Unsigned)
To pacu, VSS. Report to [email or web address] 

## 2022-03-03 NOTE — Progress Notes (Unsigned)
Called to room to assist during endoscopic procedure.  Patient ID and intended procedure confirmed with present staff. Received instructions for my participation in the procedure from the performing physician.  

## 2022-03-04 ENCOUNTER — Telehealth: Payer: Self-pay

## 2022-03-04 NOTE — Telephone Encounter (Signed)
  Follow up Call-     03/03/2022    2:24 PM  Call back number  Post procedure Call Back phone  # [phone]  Permission to leave phone message Yes     Patient questions:  Do you have a fever, pain , or abdominal swelling? No. Pain Score  0 *  Have you tolerated food without any problems? Yes.    Have you been able to return to your normal activities? Yes.    Do you have any questions about your discharge instructions: Diet   No. Medications  No. Follow up visit  No.  Do you have questions or concerns about your Care? No.  Actions: * If pain score is 4 or above: No action needed, pain <4.

## 2022-03-09 ENCOUNTER — Encounter: Payer: Self-pay | Admitting: Gastroenterology

## 2022-08-05 ENCOUNTER — Ambulatory Visit (INDEPENDENT_AMBULATORY_CARE_PROVIDER_SITE_OTHER): Payer: 59 | Admitting: Orthopaedic Surgery

## 2022-08-05 ENCOUNTER — Encounter: Payer: Self-pay | Admitting: Orthopaedic Surgery

## 2022-08-05 ENCOUNTER — Ambulatory Visit (INDEPENDENT_AMBULATORY_CARE_PROVIDER_SITE_OTHER): Payer: 59

## 2022-08-05 DIAGNOSIS — G8929 Other chronic pain: Secondary | ICD-10-CM

## 2022-08-05 DIAGNOSIS — M545 Low back pain, unspecified: Secondary | ICD-10-CM

## 2022-08-05 MED ORDER — METHYLPREDNISOLONE 4 MG PO TBPK: ORAL_TABLET | ORAL | 0 refills | Status: DC

## 2022-08-05 MED ORDER — METHOCARBAMOL 500 MG PO TABS: 500.0000 mg | ORAL_TABLET | Freq: Four times a day (QID) | ORAL | 0 refills | Status: DC | PRN

## 2022-08-05 NOTE — Progress Notes (Signed)
Office Visit Note   Patient: Paul Humphrey           Date of Birth: Nov 16, 1985           MRN: [id number] Visit Date: 08/05/2022              Requested by: Paul Humphrey, [place],  [place] [address] PCP: Paul Pollen, MD   Assessment & Plan: Visit Diagnoses:  1. Chronic midline low back pain without sciatica     Plan: Paul Humphrey is a pleasant 36 year old gentleman with a history of low back pain that dates back to 2017.  He said this began when he had bronchitis and was coughing hard.  He was seen and evaluated by Paul Humphrey and had an MRI through their office which demonstratedcentraldisc protrusion at L4 - 5 and  right sided protrusion at L5-S1.  He did have an epidural steroid injection which did help somewhat.  He had return of his symptoms in 2021 and in January had an epidural injection of L5-S1 with Dr. Ernestina Humphrey.  He has not had any problems until a day ago.  Denies any specific injury.  No loss of bowel or bladder control.  Exam findings today did not demonstrate any radicular findings.  Pain is focused in his lower back.  He is not weak.  He is neurovascularly intact.  We will try him on a Medrol Dosepak as well as prescribe him Robaxin.  He will follow-up in 1 week.  He may cancel this appointment if he is better.  Would also consider an updated MRI.  Will also speak with Dr. Ernestina Humphrey with regards to placing the referral for Paul Humphrey   Would aFollow-Up Instructions: Return in about 1 week (around 08/12/2022).   Orders:  Orders Placed This Encounter  Procedures   XR Lumbar Spine 2-3 Views   Meds ordered this encounter  Medications   methylPREDNISolone (MEDROL DOSEPAK) 4 MG TBPK tablet    Sig: Take as directed with food    Dispense:  21 tablet    Refill:  0   methocarbamol (ROBAXIN) 500 MG tablet    Sig: Take 1 tablet (500 mg total) by mouth every 6 (six) hours as needed for muscle spasms.    Dispense:  30 tablet    Refill:  0      Procedures: No  procedures performed   Clinical Data: No additional findings.   Subjective: Chief Complaint  Patient presents with   Lower Back - Pain    HPI Paul Humphrey is a pleasant 36 year old gentleman with a history of low back pain that dates back to 2017.  He has had 2 epidural injections the last being with Dr. Ernestina Humphrey in 2021 at L5-S1.  He had good relief of his symptoms.  He now has a 24-hour onset of low back pain without any radicular symptoms.  Has been taking some baclofen that he had at home.  No loss of bowel or bladder control  Review of Systems  All other systems reviewed and are negative.    Objective: Vital Signs: There were no vitals taken for this visit.  Physical Exam Constitutional:      Appearance: Normal appearance.  Pulmonary:     Effort: Pulmonary effort is normal.  Skin:    General: Skin is warm and dry.  Neurological:     Mental Status: He is alert.     Ortho Exam Examination of his lower back he is slow with transitions  from sitting to standing.  He is focally tender over the low mid back both on left and right sides.  His strength is intact.  Deep tendon reflexes are symmetric.  Sensation is intact.  Straight leg raise negative Specialty Comments:  No specialty comments available.  Imaging: XR Lumbar Spine 2-3 Views  Result Date: 08/05/2022 Radiographs of his lumbar spine were reviewed in 2 projections today.  He has well-maintained alignment without any evidence of listhesis.  Overall well-preserved joint spacing with just some joint space narrowing at L5-S1 no evidence of any fracture.  Minimal degenerative changes    PMFS History: Patient Active Problem List   Diagnosis Date Noted   Low back pain 08/05/2022   Other constipation 02/14/2022   Hemorrhoids 02/14/2022   Family history of colon cancer 02/14/2022   Family history of colonic polyps 02/14/2022   Rectal bleeding 02/14/2022   Class 1 obesity due to excess calories without serious comorbidity  with body mass index (BMI) of 33.0 to 33.9 in adult 05/23/2019   Other specified postprocedural states 11/02/2017   Impingement syndrome of shoulder region 09/14/2017   Chronic ethmoidal sinusitis 11/24/2016   Chronic maxillary sinusitis 11/24/2016   Deviated septum 11/24/2016   Pain in both hands 11/23/2016   Great toe pain, left 11/23/2016   GERD (gastroesophageal reflux disease) 03/18/2015   Abdominal pain 01/07/2015   Tingling 01/07/2015   Suspected exposure to heavy metals 01/07/2015   Vitamin D deficiency 01/07/2015   Epigastric pain 10/04/2014   Past Medical History:  Diagnosis Date   Arthritis of big toe    left   Bulging of cervical intervertebral disc    Chicken pox 1992   Chronic ethmoidal sinusitis 12/2016   Chronic maxillary sinusitis 12/2016   Dental crowns present    Deviated nasal septum 12/2016   Family history of adverse reaction to anesthesia    pt's mother has hx. of being hard to wake up post-op   Lip laceration 12/28/2016   no stitches required, per pt.   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    Sinus headache     Family History  Problem Relation Age of Onset   Cancer Mother        ?appendiceal cancer vs Cecal Cancer   Breast cancer Mother    Arthritis Mother    Anesthesia problems Mother        hard to wake up post-op   Arthritis Father    Healthy Brother    Diabetes Paternal Aunt    Colon cancer Paternal Uncle    Diabetes Maternal Grandmother    Hypertension Maternal Grandmother    Heart disease Maternal Grandmother    Cancer Maternal Grandfather    Colon cancer Maternal Grandfather    Diabetes Paternal Grandmother    Heart disease Paternal Grandmother    Diabetes Paternal Grandfather    Esophageal cancer Neg Hx    Inflammatory bowel disease Neg Hx    Liver disease Neg Hx    Pancreatic cancer Neg Hx    Stomach cancer Neg Hx     Past Surgical History:  Procedure Laterality Date   CHOLECYSTECTOMY  2016    COLECTOMY  2013   COLOSTOMY  2013   COLOSTOMY REVERSAL  2014   LEG SURGERY Left 08/12/2005   lateral compartment release   NASAL SEPTOPLASTY W/ TURBINOPLASTY Bilateral 01/04/2017   Procedure: NASAL SEPTOPLASTY WITH TURBINATE REDUCTION;  Surgeon: Izora Gala, MD;  Location: [place];  Service: ENT;  Laterality: Bilateral;   NASAL SINUS SURGERY Bilateral 01/04/2017   Procedure: ENDOSCOPIC SINUS SURGERY;  Surgeon: Izora Gala, MD;  Location: [place];  Service: ENT;  Laterality: Bilateral;   SHOULDER SURGERY Bilateral    UMBILICAL HERNIA REPAIR  2016   x 2   URETERAL STENT PLACEMENT  2014   Social History   Occupational History   Not on file  Tobacco Use   Smoking status: Never   Smokeless tobacco: Never  Vaping Use   Vaping Use: Never used  Substance and Sexual Activity   Alcohol use: Yes    Alcohol/week: 0.0 standard drinks of alcohol    Comment: socially   Drug use: No   Sexual activity: Yes

## 2022-08-11 ENCOUNTER — Ambulatory Visit: Payer: 59 | Admitting: Orthopaedic Surgery

## 2022-08-12 ENCOUNTER — Encounter: Payer: Self-pay | Admitting: Orthopaedic Surgery

## 2022-08-12 ENCOUNTER — Ambulatory Visit (INDEPENDENT_AMBULATORY_CARE_PROVIDER_SITE_OTHER): Payer: 59 | Admitting: Orthopaedic Surgery

## 2022-08-12 DIAGNOSIS — M545 Low back pain, unspecified: Secondary | ICD-10-CM | POA: Diagnosis not present

## 2022-08-12 DIAGNOSIS — G8929 Other chronic pain: Secondary | ICD-10-CM

## 2022-08-12 NOTE — Progress Notes (Signed)
Office Visit Note   Patient: Paul Humphrey           Date of Birth: 1985-09-16           MRN: [id number] Visit Date: 08/12/2022              Requested by: Horald Pollen, [place],  [place] [address] PCP: Horald Pollen, MD   Assessment & Plan: Visit Diagnoses:  1. Chronic midline low back pain without sciatica     Plan: Paul Humphrey returns for evaluation of his low back pain.  He was seen last week and placed on a steroid Dosepak.  For the first 3 to 4 days he had significant improvement but had some recurrence of his pain over the last several days.  Pain is predominantly in his back but he is having some radicular discomfort into his left leg.  Not having any bowel or bladder changes.  Straight leg raise was negative on exam.  Has had prior epidural steroid injections by Dr. Ernestina Patches with good relief.  Will ask Dr. Ernestina Patches to reevaluate and consider another epidural steroid injection.  If no improvement would suggest repeating his MRI scan.  He did have an MRI scan several years ago that demonstrated a central disc protrusion at L4-5 and a right-sided disc protrusion at L5-S1.  Not having any trouble on the right side.  Follow-Up Instructions: Return in about 2 weeks (around 08/26/2022).   Orders:  Orders Placed This Encounter  Procedures   Ambulatory referral to Physical Medicine Rehab   No orders of the defined types were placed in this encounter.     Procedures: No procedures performed   Clinical Data: No additional findings.   Subjective: Chief Complaint  Patient presents with   Lower Back - Pain, Follow-up  Better over the last week with the Medrol Dosepak but having a little recurrent back pain and some discomfort into his left lower extremity.  No bowel or bladder changes.  HPI  Review of Systems   Objective: Vital Signs: There were no vitals taken for this visit.  Physical Exam Constitutional:      Appearance: He is  well-developed.  Eyes:     Pupils: Pupils are equal, round, and reactive to light.  Pulmonary:     Effort: Pulmonary effort is normal.  Skin:    General: Skin is warm and dry.  Neurological:     Mental Status: He is alert and oriented to person, place, and time.  Psychiatric:        Behavior: Behavior normal.     Ortho Exam awake alert and oriented x 3.  Comfortable sitting.  No acute distress.  Straight leg raise is negative bilaterally.  Reflexes are symmetrical both knees and ankles.  Motor and sensory exam intact.  There is some mild percussible tenderness in the lumbar spine.  Specialty Comments:  No specialty comments available.  Imaging: No results found.   PMFS History: Patient Active Problem List   Diagnosis Date Noted   Low back pain 08/05/2022   Other constipation 02/14/2022   Hemorrhoids 02/14/2022   Family history of colon cancer 02/14/2022   Family history of colonic polyps 02/14/2022   Rectal bleeding 02/14/2022   Class 1 obesity due to excess calories without serious comorbidity with body mass index (BMI) of 33.0 to 33.9 in adult 05/23/2019   Other specified postprocedural states 11/02/2017   Impingement syndrome of shoulder region 09/14/2017   Chronic ethmoidal  sinusitis 11/24/2016   Chronic maxillary sinusitis 11/24/2016   Deviated septum 11/24/2016   Pain in both hands 11/23/2016   Great toe pain, left 11/23/2016   GERD (gastroesophageal reflux disease) 03/18/2015   Abdominal pain 01/07/2015   Tingling 01/07/2015   Suspected exposure to heavy metals 01/07/2015   Vitamin D deficiency 01/07/2015   Epigastric pain 10/04/2014   Past Medical History:  Diagnosis Date   Arthritis of big toe    left   Bulging of cervical intervertebral disc    Chicken pox 1992   Chronic ethmoidal sinusitis 12/2016   Chronic maxillary sinusitis 12/2016   Dental crowns present    Deviated nasal septum 12/2016   Family history of adverse reaction to anesthesia    pt's  mother has hx. of being hard to wake up post-op   Lip laceration 12/28/2016   no stitches required, per pt.   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    Sinus headache     Family History  Problem Relation Age of Onset   Cancer Mother        ?appendiceal cancer vs Cecal Cancer   Breast cancer Mother    Arthritis Mother    Anesthesia problems Mother        hard to wake up post-op   Arthritis Father    Healthy Brother    Diabetes Paternal Aunt    Colon cancer Paternal Uncle    Diabetes Maternal Grandmother    Hypertension Maternal Grandmother    Heart disease Maternal Grandmother    Cancer Maternal Grandfather    Colon cancer Maternal Grandfather    Diabetes Paternal Grandmother    Heart disease Paternal Grandmother    Diabetes Paternal Grandfather    Esophageal cancer Neg Hx    Inflammatory bowel disease Neg Hx    Liver disease Neg Hx    Pancreatic cancer Neg Hx    Stomach cancer Neg Hx     Past Surgical History:  Procedure Laterality Date   CHOLECYSTECTOMY  2016   COLECTOMY  2013   COLOSTOMY  2013   COLOSTOMY REVERSAL  2014   LEG SURGERY Left 08/12/2005   lateral compartment release   NASAL SEPTOPLASTY W/ TURBINOPLASTY Bilateral 01/04/2017   Procedure: NASAL SEPTOPLASTY WITH TURBINATE REDUCTION;  Surgeon: Izora Gala, MD;  Location: [place];  Service: ENT;  Laterality: Bilateral;   NASAL SINUS SURGERY Bilateral 01/04/2017   Procedure: ENDOSCOPIC SINUS SURGERY;  Surgeon: Izora Gala, MD;  Location: [place];  Service: ENT;  Laterality: Bilateral;   SHOULDER SURGERY Bilateral    UMBILICAL HERNIA REPAIR  2016   x 2   URETERAL STENT PLACEMENT  2014   Social History   Occupational History   Not on file  Tobacco Use   Smoking status: Never   Smokeless tobacco: Never  Vaping Use   Vaping Use: Never used  Substance and Sexual Activity   Alcohol use: Yes    Alcohol/week: 0.0 standard drinks of  alcohol    Comment: socially   Drug use: No   Sexual activity: Yes     Garald Balding, MD   Note - This record has been created using Editor, commissioning.  Chart creation errors have been sought, but may not always  have been located. Such creation errors do not reflect on  the standard of medical care.

## 2022-08-13 ENCOUNTER — Other Ambulatory Visit: Payer: Self-pay | Admitting: Orthopaedic Surgery

## 2022-08-13 DIAGNOSIS — M545 Low back pain, unspecified: Secondary | ICD-10-CM

## 2022-08-19 ENCOUNTER — Ambulatory Visit
Admission: RE | Admit: 2022-08-19 | Discharge: 2022-08-19 | Disposition: A | Discharge status: Home / Self Care | Payer: 59 | Source: Ambulatory Visit | Attending: Orthopaedic Surgery | Admitting: Orthopaedic Surgery

## 2022-08-19 DIAGNOSIS — M545 Low back pain, unspecified: Secondary | ICD-10-CM

## 2022-08-19 MED ORDER — IOPAMIDOL (ISOVUE-M 200) INJECTION 41%
1.0000 mL | Freq: Once | INTRAMUSCULAR | Status: AC
  Administered 2022-08-19: 1 mL via EPIDURAL

## 2022-08-19 MED ORDER — METHYLPREDNISOLONE ACETATE 40 MG/ML INJ SUSP (RADIOLOG
80.0000 mg | Freq: Once | INTRAMUSCULAR | Status: AC
  Administered 2022-08-19: 80 mg via EPIDURAL

## 2022-08-19 NOTE — Discharge Instructions (Signed)

## 2023-03-16 ENCOUNTER — Ambulatory Visit (INDEPENDENT_AMBULATORY_CARE_PROVIDER_SITE_OTHER): Payer: 59

## 2023-03-16 ENCOUNTER — Ambulatory Visit (INDEPENDENT_AMBULATORY_CARE_PROVIDER_SITE_OTHER): Payer: 59 | Admitting: Emergency Medicine

## 2023-03-16 ENCOUNTER — Encounter: Payer: Self-pay | Admitting: Emergency Medicine

## 2023-03-16 VITALS — BP 126/80 | HR 85 | Temp 98.2°F | Ht 74.0 in | Wt 291.5 lb

## 2023-03-16 DIAGNOSIS — Z13220 Encounter for screening for lipoid disorders: Secondary | ICD-10-CM | POA: Diagnosis not present

## 2023-03-16 DIAGNOSIS — U099 Post covid-19 condition, unspecified: Secondary | ICD-10-CM

## 2023-03-16 DIAGNOSIS — Z131 Encounter for screening for diabetes mellitus: Secondary | ICD-10-CM

## 2023-03-16 DIAGNOSIS — R0609 Other forms of dyspnea: Secondary | ICD-10-CM

## 2023-03-16 DIAGNOSIS — Z0001 Encounter for general adult medical examination with abnormal findings: Secondary | ICD-10-CM | POA: Diagnosis not present

## 2023-03-16 DIAGNOSIS — Z130 Encounter for screening for diseases of the blood and blood-forming organs and certain disorders involving the immune mechanism: Secondary | ICD-10-CM

## 2023-03-16 DIAGNOSIS — R053 Chronic cough: Secondary | ICD-10-CM

## 2023-03-16 LAB — CBC WITH DIFFERENTIAL/PLATELET
Basophils Absolute: 0 10*3/uL (ref 0.0–0.1)
Basophils Relative: 0.5 % (ref 0.0–3.0)
Eosinophils Absolute: 0.2 10*3/uL (ref 0.0–0.7)
Eosinophils Relative: 2.4 % (ref 0.0–5.0)
HCT: 49.3 % (ref 39.0–52.0)
Hemoglobin: 16.5 g/dL (ref 13.0–17.0)
Lymphocytes Relative: 28.2 % (ref 12.0–46.0)
Lymphs Abs: 2 10*3/uL (ref 0.7–4.0)
MCHC: 33.4 g/dL (ref 30.0–36.0)
MCV: 89.2 fl (ref 78.0–100.0)
Monocytes Absolute: 0.5 10*3/uL (ref 0.1–1.0)
Monocytes Relative: 6.9 % (ref 3.0–12.0)
Neutro Abs: 4.4 10*3/uL (ref 1.4–7.7)
Neutrophils Relative %: 62 % (ref 43.0–77.0)
Platelets: 177 10*3/uL (ref 150.0–400.0)
RBC: 5.53 Mil/uL (ref 4.22–5.81)
RDW: 13.8 % (ref 11.5–15.5)
WBC: 7.1 10*3/uL (ref 4.0–10.5)

## 2023-03-16 LAB — COMPREHENSIVE METABOLIC PANEL
ALT: 49 U/L (ref 0–53)
AST: 32 U/L (ref 0–37)
Albumin: 4.5 g/dL (ref 3.5–5.2)
Alkaline Phosphatase: 38 U/L — ABNORMAL LOW (ref 39–117)
BUN: 15 mg/dL (ref 6–23)
CO2: 26 mEq/L (ref 19–32)
Calcium: 9.4 mg/dL (ref 8.4–10.5)
Chloride: 104 mEq/L (ref 96–112)
Creatinine, Ser: 1.09 mg/dL (ref 0.40–1.50)
GFR: 88.47 mL/min (ref >60.00)
Glucose, Bld: 82 mg/dL (ref 70–99)
Potassium: 4.4 mEq/L (ref 3.5–5.1)
Sodium: 137 mEq/L (ref 135–145)
Total Bilirubin: 0.8 mg/dL (ref 0.2–1.2)
Total Protein: 7.4 g/dL (ref 6.0–8.3)

## 2023-03-16 LAB — LIPID PANEL
Cholesterol: 156 mg/dL (ref 0–200)
HDL: 31.6 mg/dL — ABNORMAL LOW (ref >39.00)
LDL Cholesterol: 89 mg/dL (ref 0–99)
NonHDL: 124.18
Total CHOL/HDL Ratio: 5
Triglycerides: 176 mg/dL — ABNORMAL HIGH (ref 0.0–149.0)
VLDL: 35.2 mg/dL (ref 0.0–40.0)

## 2023-03-16 LAB — HEMOGLOBIN A1C: Hgb A1c MFr Bld: 5 % (ref 4.6–6.5)

## 2023-03-16 NOTE — Assessment & Plan Note (Signed)
Along with diminished pulmonary functional ability Chest x-ray done today.  Report reviewed. Needs pulmonary evaluation. Referral placed today.

## 2023-03-16 NOTE — Progress Notes (Signed)
Paul Humphrey 37 y.o.   Chief Complaint  Patient presents with   Annual Exam    Patient is wanting a referral for a pulmonary function test, patient states since COVId he has had some issues with breathing , coughing     HISTORY OF PRESENT ILLNESS: This is a 37 y.o. male here for annual exam. Since COVID infection in May 2021 has had dyspnea on exertion and intermittent episodes of coughing spells Non-smoker.  No other associated symptoms.  No history of asthma or COPD. No other complaints or medical concerns today. Family history of colon cancer.  Had colonoscopy in 2023   HPI   Prior to Admission medications   Medication Sig Start Date End Date Taking? Authorizing Provider  Cholecalciferol (VITAMIN D3 PO) Take by mouth daily.   Yes [provider]  Glucosamine HCl (GLUCOSAMINE PO) Take by mouth 2 (two) times daily.   Yes [provider]  Multiple Vitamins-Minerals (MULTIVITAMIN ADULT PO) Take 1 tablet by mouth daily. Reported on 01/18/2016   Yes [provider]    Allergies  Allergen Reactions   Gadolinium Derivatives Nausea And Vomiting   Lactose Intolerance (Gi) Other (See Comments)    GI UPSET   Percocet [Oxycodone-Acetaminophen] Other (See Comments)    "SKIN CRAWLING" SENSATION   Fentanyl Other (See Comments)    Received in hospital 2015 - "heart rate dropped to 13"    Patient Active Problem List   Diagnosis Date Noted   Low back pain 08/05/2022   Other constipation 02/14/2022   Hemorrhoids 02/14/2022   Family history of colon cancer 02/14/2022   Family history of colonic polyps 02/14/2022   Rectal bleeding 02/14/2022   Class 1 obesity due to excess calories without serious comorbidity with body mass index (BMI) of 33.0 to 33.9 in adult 05/23/2019   Other specified postprocedural states 11/02/2017   Impingement syndrome of shoulder region 09/14/2017   Chronic ethmoidal sinusitis 11/24/2016   Chronic maxillary sinusitis 11/24/2016    Deviated septum 11/24/2016   Pain in both hands 11/23/2016   Great toe pain, left 11/23/2016   GERD (gastroesophageal reflux disease) 03/18/2015   Abdominal pain 01/07/2015   Tingling 01/07/2015   Suspected exposure to heavy metals 01/07/2015   Vitamin D deficiency 01/07/2015   Epigastric pain 10/04/2014    Past Medical History:  Diagnosis Date   Arthritis of big toe    left   Bulging of cervical intervertebral disc    Chicken pox 1992   Chronic ethmoidal sinusitis 12/2016   Chronic maxillary sinusitis 12/2016   Dental crowns present    Deviated nasal septum 12/2016   Family history of adverse reaction to anesthesia    pt's mother has hx. of being hard to wake up post-op   Lip laceration 12/28/2016   no stitches required, per pt.   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    Sinus headache     Past Surgical History:  Procedure Laterality Date   CHOLECYSTECTOMY  2016   COLECTOMY  2013   COLOSTOMY  2013   COLOSTOMY REVERSAL  2014   LEG SURGERY Left 08/12/2005   lateral compartment release   NASAL SEPTOPLASTY W/ TURBINOPLASTY Bilateral 01/04/2017   Procedure: NASAL SEPTOPLASTY WITH TURBINATE REDUCTION;  Surgeon: Serena Colonel, MD;  Location: [facility];  Service: ENT;  Laterality: Bilateral;   NASAL SINUS SURGERY Bilateral 01/04/2017   Procedure: ENDOSCOPIC SINUS SURGERY;  Surgeon: Serena Colonel, MD;  Location: [facility];  Service: ENT;  Laterality: Bilateral;   SHOULDER SURGERY Bilateral    UMBILICAL HERNIA REPAIR  2016   x 2   URETERAL STENT PLACEMENT  2014    Social History   Socioeconomic History   Marital status: Single    Spouse name: Not on file   Number of children: Not on file   Years of education: Not on file   Highest education level: Not on file  Occupational History   Not on file  Tobacco Use   Smoking status: Never   Smokeless tobacco: Never  Vaping Use   Vaping status: Never Used   Substance and Sexual Activity   Alcohol use: Yes    Alcohol/week: 0.0 standard drinks of alcohol    Comment: socially   Drug use: No   Sexual activity: Yes  Other Topics Concern   Not on file  Social History Narrative   Lives with parents in a 2 story home.     Has no children.    Works as a Runner, broadcasting/film/video, looking for a job here.    Highest level of education:  MFA   Social Determinants of Health   Financial Resource Strain: Not on file  Food Insecurity: Not on file  Transportation Needs: Not on file  Physical Activity: Not on file  Stress: Not on file  Social Connections: Not on file  Intimate Partner Violence: Not on file    Family History  Problem Relation Age of Onset   Cancer Mother        ?appendiceal cancer vs Cecal Cancer   Breast cancer Mother    Arthritis Mother    Anesthesia problems Mother        hard to wake up post-op   Arthritis Father    Healthy Brother    Diabetes Paternal Aunt    Colon cancer Paternal Uncle    Diabetes Maternal Grandmother    Hypertension Maternal Grandmother    Heart disease Maternal Grandmother    Cancer Maternal Grandfather    Colon cancer Maternal Grandfather    Diabetes Paternal Grandmother    Heart disease Paternal Grandmother    Diabetes Paternal Grandfather    Esophageal cancer Neg Hx    Inflammatory bowel disease Neg Hx    Liver disease Neg Hx    Pancreatic cancer Neg Hx    Stomach cancer Neg Hx      Review of Systems  Constitutional: Negative.  Negative for chills and fever.  HENT: Negative.  Negative for congestion.   Respiratory:  Positive for shortness of breath.   Cardiovascular: Negative.  Negative for chest pain and palpitations.  Gastrointestinal:  Negative for abdominal pain, nausea and vomiting.  Genitourinary: Negative.  Negative for dysuria and hematuria.  Skin: Negative.   Neurological: Negative.  Negative for dizziness and headaches.  All other systems reviewed and are negative.   Vitals:    03/16/23 1356  BP: 126/80  Pulse: 85  Temp: 98.2 F (36.8 C)  SpO2: 99%    Physical Exam Vitals reviewed.  Constitutional:      Appearance: Normal appearance. He is obese.  HENT:     Head: Normocephalic.     Right Ear: Tympanic membrane, ear canal and external ear normal.     Left Ear: Tympanic membrane, ear canal and external ear normal.     Mouth/Throat:     Mouth: Mucous membranes are moist.     Pharynx: Oropharynx is clear.  Eyes:     Extraocular  Movements: Extraocular movements intact.     Conjunctiva/sclera: Conjunctivae normal.     Pupils: Pupils are equal, round, and reactive to light.  Cardiovascular:     Rate and Rhythm: Normal rate and regular rhythm.     Pulses: Normal pulses.     Heart sounds: Normal heart sounds.  Pulmonary:     Effort: Pulmonary effort is normal.     Breath sounds: Normal breath sounds.  Abdominal:     Palpations: Abdomen is soft.     Tenderness: There is no abdominal tenderness.  Musculoskeletal:     Cervical back: No tenderness.  Lymphadenopathy:     Cervical: No cervical adenopathy.  Skin:    General: Skin is warm and dry.     Capillary Refill: Capillary refill takes less than 2 seconds.  Neurological:     General: No focal deficit present.     Mental Status: He is alert and oriented to person, place, and time.  Psychiatric:        Mood and Affect: Mood normal.        Behavior: Behavior normal.    DG Chest 2 View  Result Date: 03/16/2023 CLINICAL DATA:  Dyspnea on exertion for 2 years after having COVID. EXAM: CHEST - 2 VIEW COMPARISON:  Chest radiograph 01/27/2022 FINDINGS: The cardiomediastinal silhouette is stable and within normal limits There is no focal consolidation or pulmonary edema. There is no pleural effusion or pneumothorax There is no acute osseous abnormality. IMPRESSION: No radiographic evidence of acute cardiopulmonary process. Electronically Signed   By: Lesia Hausen M.D.   On: 03/16/2023 15:25     ASSESSMENT &  PLAN: Problem List Items Addressed This Visit       Other   Post-COVID chronic cough    Along with diminished pulmonary functional ability Chest x-ray done today.  Report reviewed. Needs pulmonary evaluation. Referral placed today.      Relevant Orders   Ambulatory referral to Pulmonology   DG Chest 2 View   Dyspnea on exertion    Since COVID infection in 2021. Recommend pulmonary function tests. Chest x-ray done today.  Report reviewed. Recommend pulmonary evaluation. Referral placed today.      Relevant Orders   Ambulatory referral to Pulmonology   DG Chest 2 View   Other Visit Diagnoses     Encounter for general adult medical examination with abnormal findings    -  Primary   Relevant Orders   CBC with Differential   Comprehensive metabolic panel   Hemoglobin A1c   Lipid panel   Screening for deficiency anemia       Relevant Orders   CBC with Differential   Screening for lipoid disorders       Relevant Orders   Lipid panel   Screening for endocrine, metabolic and immunity disorder       Relevant Orders   Comprehensive metabolic panel   Hemoglobin A1c      Modifiable risk factors discussed with patient. Anticipatory guidance according to age provided. The following topics were also discussed: Social Determinants of Health Smoking.  Non-smoker Diet and nutrition and need to decrease amount of daily carbohydrate intake and daily calories and increase amount of plant-based protein in his diet Benefits of exercise Cancer family history review and review of colonoscopy report done in 2023 Vaccinations reviewed and recommendations Cardiovascular risk assessment and need for blood work Mental health including depression and anxiety Fall and accident prevention  Patient Instructions  Health Maintenance, Male Adopting a  healthy lifestyle and getting preventive care are important in promoting health and wellness. Ask your health care provider about: The right  schedule for you to have regular tests and exams. Things you can do on your own to prevent diseases and keep yourself healthy. What should I know about diet, weight, and exercise? Eat a healthy diet  Eat a diet that includes plenty of vegetables, fruits, low-fat dairy products, and lean protein. Do not eat a lot of foods that are high in solid fats, added sugars, or sodium. Maintain a healthy weight Body mass index (BMI) is a measurement that can be used to identify possible weight problems. It estimates body fat based on height and weight. Your health care provider can help determine your BMI and help you achieve or maintain a healthy weight. Get regular exercise Get regular exercise. This is one of the most important things you can do for your health. Most adults should: Exercise for at least 150 minutes each week. The exercise should increase your heart rate and make you sweat (moderate-intensity exercise). Do strengthening exercises at least twice a week. This is in addition to the moderate-intensity exercise. Spend less time sitting. Even light physical activity can be beneficial. Watch cholesterol and blood lipids Have your blood tested for lipids and cholesterol at 35 years of age, then have this test every 5 years. You may need to have your cholesterol levels checked more often if: Your lipid or cholesterol levels are high. You are older than 35 years of age. You are at high risk for heart disease. What should I know about cancer screening? Many types of cancers can be detected early and may often be prevented. Depending on your health history and family history, you may need to have cancer screening at various ages. This may include screening for: Colorectal cancer. Prostate cancer. Skin cancer. Lung cancer. What should I know about heart disease, diabetes, and high blood pressure? Blood pressure and heart disease High blood pressure causes heart disease and increases the risk of  stroke. This is more likely to develop in people who have high blood pressure readings or are overweight. Talk with your health care provider about your target blood pressure readings. Have your blood pressure checked: Every 3-5 years if you are 23-38 years of age. Every year if you are 71 years old or older. If you are between the ages of 63 and 32 and are a current or former smoker, ask your health care provider if you should have a one-time screening for abdominal aortic aneurysm (AAA). Diabetes Have regular diabetes screenings. This checks your fasting blood sugar level. Have the screening done: Once every three years after age 86 if you are at a normal weight and have a low risk for diabetes. More often and at a younger age if you are overweight or have a high risk for diabetes. What should I know about preventing infection? Hepatitis B If you have a higher risk for hepatitis B, you should be screened for this virus. Talk with your health care provider to find out if you are at risk for hepatitis B infection. Hepatitis C Blood testing is recommended for: Everyone born from [birth year] through 1965. Anyone with known risk factors for hepatitis C. Sexually transmitted infections (STIs) You should be screened each year for STIs, including gonorrhea and chlamydia, if: You are sexually active and are younger than 35 years of age. You are older than 35 years of age and your health care provider tells  you that you are at risk for this type of infection. Your sexual activity has changed since you were last screened, and you are at increased risk for chlamydia or gonorrhea. Ask your health care provider if you are at risk. Ask your health care provider about whether you are at high risk for HIV. Your health care provider may recommend a prescription medicine to help prevent HIV infection. If you choose to take medicine to prevent HIV, you should first get tested for HIV. You should then be tested every 3  months for as long as you are taking the medicine. Follow these instructions at home: Alcohol use Do not drink alcohol if your health care provider tells you not to drink. If you drink alcohol: Limit how much you have to 0-2 drinks a day. Know how much alcohol is in your drink. In the [place] one drink equals one 12 oz bottle of beer (355 mL), one 5 oz glass of wine (148 mL), or one 1 oz glass of hard liquor (44 mL). Lifestyle Do not use any products that contain nicotine or tobacco. These products include cigarettes, chewing tobacco, and vaping devices, such as e-cigarettes. If you need help quitting, ask your health care provider. Do not use street drugs. Do not share needles. Ask your health care provider for help if you need support or information about quitting drugs. General instructions Schedule regular health, dental, and eye exams. Stay current with your vaccines. Tell your health care provider if: You often feel depressed. You have ever been abused or do not feel safe at home. Summary Adopting a healthy lifestyle and getting preventive care are important in promoting health and wellness. Follow your health care provider's instructions about healthy diet, exercising, and getting tested or screened for diseases. Follow your health care provider's instructions on monitoring your cholesterol and blood pressure. This information is not intended to replace advice given to you by your health care provider. Make sure you discuss any questions you have with your health care provider. Document Revised: 12/30/2020 Document Reviewed: 12/30/2020 Elsevier Patient Education  2024 Elsevier Inc.     Edwina Barth, MD [facility]

## 2023-03-16 NOTE — Assessment & Plan Note (Signed)
Since COVID infection in 2021. Recommend pulmonary function tests. Chest x-ray done today.  Report reviewed. Recommend pulmonary evaluation. Referral placed today.

## 2023-03-16 NOTE — Patient Instructions (Signed)
Health Maintenance, Male Adopting a healthy lifestyle and getting preventive care are important in promoting health and wellness. Ask your health care provider about: The right schedule for you to have regular tests and exams. Things you can do on your own to prevent diseases and keep yourself healthy. What should I know about diet, weight, and exercise? Eat a healthy diet  Eat a diet that includes plenty of vegetables, fruits, low-fat dairy products, and lean protein. Do not eat a lot of foods that are high in solid fats, added sugars, or sodium. Maintain a healthy weight Body mass index (BMI) is a measurement that can be used to identify possible weight problems. It estimates body fat based on height and weight. Your health care provider can help determine your BMI and help you achieve or maintain a healthy weight. Get regular exercise Get regular exercise. This is one of the most important things you can do for your health. Most adults should: Exercise for at least 150 minutes each week. The exercise should increase your heart rate and make you sweat (moderate-intensity exercise). Do strengthening exercises at least twice a week. This is in addition to the moderate-intensity exercise. Spend less time sitting. Even light physical activity can be beneficial. Watch cholesterol and blood lipids Have your blood tested for lipids and cholesterol at 35 years of age, then have this test every 5 years. You may need to have your cholesterol levels checked more often if: Your lipid or cholesterol levels are high. You are older than 35 years of age. You are at high risk for heart disease. What should I know about cancer screening? Many types of cancers can be detected early and may often be prevented. Depending on your health history and family history, you may need to have cancer screening at various ages. This may include screening for: Colorectal cancer. Prostate cancer. Skin cancer. Lung  cancer. What should I know about heart disease, diabetes, and high blood pressure? Blood pressure and heart disease High blood pressure causes heart disease and increases the risk of stroke. This is more likely to develop in people who have high blood pressure readings or are overweight. Talk with your health care provider about your target blood pressure readings. Have your blood pressure checked: Every 3-5 years if you are 18-39 years of age. Every year if you are 40 years old or older. If you are between the ages of 65 and 75 and are a current or former smoker, ask your health care provider if you should have a one-time screening for abdominal aortic aneurysm (AAA). Diabetes Have regular diabetes screenings. This checks your fasting blood sugar level. Have the screening done: Once every three years after age 45 if you are at a normal weight and have a low risk for diabetes. More often and at a younger age if you are overweight or have a high risk for diabetes. What should I know about preventing infection? Hepatitis B If you have a higher risk for hepatitis B, you should be screened for this virus. Talk with your health care provider to find out if you are at risk for hepatitis B infection. Hepatitis C Blood testing is recommended for: Everyone born from 1945 through 1965. Anyone with known risk factors for hepatitis C. Sexually transmitted infections (STIs) You should be screened each year for STIs, including gonorrhea and chlamydia, if: You are sexually active and are younger than 35 years of age. You are older than 35 years of age and your   health care provider tells you that you are at risk for this type of infection. Your sexual activity has changed since you were last screened, and you are at increased risk for chlamydia or gonorrhea. Ask your health care provider if you are at risk. Ask your health care provider about whether you are at high risk for HIV. Your health care provider  may recommend a prescription medicine to help prevent HIV infection. If you choose to take medicine to prevent HIV, you should first get tested for HIV. You should then be tested every 3 months for as long as you are taking the medicine. Follow these instructions at home: Alcohol use Do not drink alcohol if your health care provider tells you not to drink. If you drink alcohol: Limit how much you have to 0-2 drinks a day. Know how much alcohol is in your drink. In the [place] one drink equals one 12 oz bottle of beer (355 mL), one 5 oz glass of wine (148 mL), or one 1 oz glass of hard liquor (44 mL). Lifestyle Do not use any products that contain nicotine or tobacco. These products include cigarettes, chewing tobacco, and vaping devices, such as e-cigarettes. If you need help quitting, ask your health care provider. Do not use street drugs. Do not share needles. Ask your health care provider for help if you need support or information about quitting drugs. General instructions Schedule regular health, dental, and eye exams. Stay current with your vaccines. Tell your health care provider if: You often feel depressed. You have ever been abused or do not feel safe at home. Summary Adopting a healthy lifestyle and getting preventive care are important in promoting health and wellness. Follow your health care provider's instructions about healthy diet, exercising, and getting tested or screened for diseases. Follow your health care provider's instructions on monitoring your cholesterol and blood pressure. This information is not intended to replace advice given to you by your health care provider. Make sure you discuss any questions you have with your health care provider. Document Revised: 12/30/2020 Document Reviewed: 12/30/2020 Elsevier Patient Education  2024 [employer].  

## 2023-04-01 ENCOUNTER — Encounter: Payer: Self-pay | Admitting: Pulmonary Disease

## 2023-04-01 ENCOUNTER — Ambulatory Visit (INDEPENDENT_AMBULATORY_CARE_PROVIDER_SITE_OTHER): Payer: 59 | Admitting: Pulmonary Disease

## 2023-04-01 VITALS — BP 130/84 | HR 92 | Temp 98.2°F | Ht 74.0 in | Wt 298.0 lb

## 2023-04-01 DIAGNOSIS — R0602 Shortness of breath: Secondary | ICD-10-CM

## 2023-04-01 DIAGNOSIS — R0683 Snoring: Secondary | ICD-10-CM | POA: Diagnosis not present

## 2023-04-01 DIAGNOSIS — K219 Gastro-esophageal reflux disease without esophagitis: Secondary | ICD-10-CM

## 2023-04-01 LAB — NITRIC OXIDE: Nitric Oxide: 20

## 2023-04-01 MED ORDER — AIRSUPRA 90-80 MCG/ACT IN AERO: 2.0000 | INHALATION_SPRAY | RESPIRATORY_TRACT | Status: AC | PRN

## 2023-04-01 MED ORDER — PANTOPRAZOLE SODIUM 40 MG PO TBEC: 40.0000 mg | DELAYED_RELEASE_TABLET | Freq: Every day | ORAL | 2 refills | Status: DC

## 2023-04-01 NOTE — Progress Notes (Signed)
Subjective:    Patient ID: [place], male    DOB: 11/25/1985, 37 y.o.   MRN: [id number]  Patient Care Team: Georgina Quint, MD as PCP - General (Internal Medicine) O'Neal, Ronnald Ramp, MD as PCP - Cardiology (Internal Medicine)  Chief Complaint  Patient presents with   Consult    Covid in 12/2019. Noticed when he laughs he chokes and starts gasping. Gets bronchitis very easily. SOB with running. Lymphedema cause increase SOB when it flares up. Wheezing when he has "coughing fits."     HPI Patient is a 37 year old lifelong never smoker with a history as noted below, who presents for evaluation of shortness of breath and choking sensation particularly when he laughs.  He is kindly referred by Dr. Michail Jewels Sagardia.  The patient states that he had COVID in May 2021 and since then has noted that he has dry nonproductive cough that does not appear to have any precipitating factor.  He has noted that he also has had choking sensation when he laughs hard.  He notes that it has been worse this summer.  Patient notes that with hearty laugh or late in the evening the choking sensation and cough can get worse.  It can also occur randomly.  He has not found anything that improves the symptoms.  He does note increasing shortness of breath when he jogs or runs.  He had a chest x-ray performed on 16 March 2023 that showed no active disease.  He also notes that he has had lymphedema of the torso shoulders and neck abdomen and groin since his COVID diagnosis.  He had a negative cardiac workup after that.  For a while he was living in [place], I have reviewed the [facility] studies performed.  He has not had any fevers, chills or sweats.  No orthopnea or paroxysmal nocturnal dyspnea.  No lower extremity edema.  He does note that he has issues with recurrent bronchitis when he gets upper respiratory infections.  He has not had prior chronic sinusitis but this was resolved after surgery.   He has been told that he has loud snoring.  He does note nonrestorative sleep.  Prior studies in [place] show that he had extensive workup.  Has never had PFTs however.  He had several studies in 2016 at [facility] most notable in EGD with Bravo monitor in February 2016 that showed that he has significant acid reflux during the day and continuation of PPI was recommended.  He had cholecystectomy in March 2016 at [facility] due to biliary reflux.  He had a prior stress echocardiogram around 2016 which was negative.  Also a sleep study in March 2016 at [facility] that was negative for sleep apnea however, he states that at that time he was very thin he has since gained significant amount of weight.  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  Works mostly with [employer] and occasional masonite.  No redwoods.  Exposed to sawdust and nasal light dust.  Did not wear a mask previously but now wears a mask consistently doing this work.  He has been exposed to inorganic dust and sawdust for at least a decade.   Review of Systems A 10 point review of systems was performed and it is as noted above otherwise negative.   Past Medical History:  Diagnosis Date   Arthritis of big toe    left  Bulging of cervical intervertebral disc    Chicken pox 1992   Chronic ethmoidal sinusitis 12/2016   Chronic maxillary sinusitis 12/2016   Dental crowns present    Deviated nasal septum 12/2016   Family history of adverse reaction to anesthesia    pt's mother has hx. of being hard to wake up post-op   Lip laceration 12/28/2016   no stitches required, per pt.   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    Sinus headache     Past Surgical History:  Procedure Laterality Date   CHOLECYSTECTOMY  2016   COLECTOMY  2013   COLOSTOMY  2013   COLOSTOMY REVERSAL  2014   LEG SURGERY Left 08/12/2005   lateral compartment  release   NASAL SEPTOPLASTY W/ TURBINOPLASTY Bilateral 01/04/2017   Procedure: NASAL SEPTOPLASTY WITH TURBINATE REDUCTION;  Surgeon: Serena Colonel, MD;  Location: [facility];  Service: ENT;  Laterality: Bilateral;   NASAL SINUS SURGERY Bilateral 01/04/2017   Procedure: ENDOSCOPIC SINUS SURGERY;  Surgeon: Serena Colonel, MD;  Location: [facility];  Service: ENT;  Laterality: Bilateral;   SHOULDER SURGERY Bilateral    UMBILICAL HERNIA REPAIR  2016   x 2   URETERAL STENT PLACEMENT  2014    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   Dyspnea on exertion 03/16/2023   Other constipation 02/14/2022   Hemorrhoids 02/14/2022   Family history of colon cancer 02/14/2022   Family history of colonic polyps 02/14/2022   Class 1 obesity due to excess calories without serious comorbidity with body mass index (BMI) of 33.0 to 33.9 in adult 05/23/2019   Other specified postprocedural states 11/02/2017   Impingement syndrome of shoulder region 09/14/2017   Chronic ethmoidal sinusitis 11/24/2016   Chronic maxillary sinusitis 11/24/2016   Deviated septum 11/24/2016   GERD (gastroesophageal reflux disease) 03/18/2015   Vitamin D deficiency 01/07/2015    Family History  Problem Relation Age of Onset   Cancer Mother        ?appendiceal cancer vs Cecal Cancer   Breast cancer Mother    Arthritis Mother    Anesthesia problems Mother        hard to wake up post-op   Arthritis Father    Healthy Brother    Diabetes Paternal Aunt    Colon cancer Paternal Uncle    Diabetes Maternal Grandmother    Hypertension Maternal Grandmother    Heart disease Maternal Grandmother    Cancer Maternal Grandfather    Colon cancer Maternal Grandfather    Diabetes Paternal Grandmother    Heart disease Paternal Grandmother    Diabetes Paternal Grandfather    Esophageal cancer Neg Hx    Inflammatory bowel disease Neg Hx    Liver disease Neg Hx     Pancreatic cancer Neg Hx    Stomach cancer Neg Hx     Social History   Tobacco Use   Smoking status: Never   Smokeless tobacco: Never  Substance Use Topics   Alcohol use: Yes    Alcohol/week: 0.0 standard drinks of alcohol    Comment: socially    Allergies  Allergen Reactions   Gadolinium Derivatives Nausea And Vomiting   Lactose Intolerance (Gi) Other (See Comments)    GI UPSET   Percocet [Oxycodone-Acetaminophen] Other (See Comments)    "SKIN CRAWLING" SENSATION   Fentanyl Other (See Comments)    Received in hospital 2015 - "heart rate  dropped to 13"    Current Meds  Medication Sig   Cholecalciferol (VITAMIN D3 PO) Take by mouth daily.   Glucosamine HCl (GLUCOSAMINE PO) Take by mouth 2 (two) times daily.   levocetirizine (XYZAL) 5 MG tablet Take 2.5 mg by mouth every evening.   Multiple Vitamins-Minerals (MULTIVITAMIN ADULT PO) Take 1 tablet by mouth daily. Reported on 01/18/2016    Immunization History  Administered Date(s) Administered   PFIZER Comirnaty(Gray Top)Covid-19 Tri-Sucrose Vaccine 01/29/2021   PFIZER(Purple Top)SARS-COV-2 Vaccination 11/04/2019, 11/25/2019   Tdap 03/18/2015      Objective:     BP 130/84 (BP Location: Left Arm, Cuff Size: Large)   Pulse 92   Temp 98.2 F (36.8 C)   Ht 6\' 2"  (1.88 m)   Wt 298 lb (135.2 kg)   SpO2 98%   BMI 38.26 kg/m   SpO2: 98 % O2 Device: None (Room air)  GENERAL: Obese gentleman, no acute distress, fully ambulatory, no conversational dyspnea. HEAD: Normocephalic, atraumatic.  EYES: Pupils equal, round, reactive to light.  No scleral icterus.  MOUTH: Nose/mouth/throat not examined due to institutional/personal preference masking requirements. NECK: Supple. No thyromegaly. Trachea midline. No JVD.  No adenopathy. PULMONARY: Good air entry bilaterally.  End expiratory wheeze on forced exhalation, otherwise clear. CARDIOVASCULAR: S1 and S2. Regular rate and rhythm.  No rubs, murmurs or gallops  heard. ABDOMEN: Obese otherwise benign. MUSCULOSKELETAL: No joint deformity, no clubbing, no edema.  NEUROLOGIC: No overt focal deficit, no gait disturbance, speech is fluent. SKIN: Intact,warm,dry. PSYCH: Mood and behavior normal.   Chest x-ray performed 16 March 2023 with no active cardiopulmonary disease:   Lab Results  Component Value Date   NITRICOXIDE 20 04/01/2023      04/01/2023    9:00 AM  Results of the [facility] flowsheet  Sitting and reading 1  Watching TV 1  Sitting, inactive in a public place (e.g. a theatre or a meeting) 2  As a passenger in a car for an hour without a break 3  Lying down to rest in the afternoon when circumstances permit 3  Sitting and talking to someone 1  Sitting quietly after a lunch without alcohol 2  In a car, while stopped for a few minutes in traffic 1  Total score 14     Assessment & Plan:     ICD-10-CM   1. SOB (shortness of breath)  R06.02 Nitric oxide    Pulmonary function test   Will obtain PFTs No significant type II inflammation noted on airway today Trial of AirSupra Query cough variant versus exercise-induced asthma (EIA)    2. Gastroesophageal reflux disease, unspecified whether esophagitis present  K21.9    Previously documented by Bravo study Trial of Protonix GERD can mimic EIA    3. Loud snoring  R06.83 Home sleep test   Loud snoring plus nonrestorative sleep Significant weight gain since 2016 Home sleep study High suspect for sleep apnea     Orders Placed This Encounter  Procedures   Nitric oxide   Pulmonary function test    Standing Status:   Future    Standing Expiration Date:   03/31/2024    Order Specific Question:   Where should this test be performed?    Answer:   [facility]   Home sleep test    Standing Status:   Future    Standing Expiration Date:   03/31/2024    Order Specific Question:   Where should this test be performed:    Answer:  [facility] - Pul[facility]   Meds ordered this encounter  Medications    Albuterol-Budesonide (AIRSUPRA) 90-80 MCG/ACT AERO    Sig: Inhale 2 puffs into the lungs every 4 (four) hours as needed.    Order Specific Question:   Lot Number?    Answer:   [id number]    Order Specific Question:   Expiration Date?    Answer:   02/22/2024    Order Specific Question:   Manufacturer?    Answer:   AstraZeneca [[id number]]    Order Specific Question:   Quantity    Answer:   1   pantoprazole (PROTONIX) 40 MG tablet    Sig: Take 1 tablet (40 mg total) by mouth daily.    Dispense:  30 tablet    Refill:  2   Will see the patient in follow-up in 6 to 8 weeks time he is to contact us prior to that time should any new difficulties arise.   Gailen Shelter, MD Advanced Bronchoscopy PCCM [facility]    *This note was dictated using voice recognition software/Dragon.  Despite best efforts to proofread, errors can occur which can change the meaning. Any transcriptional errors that result from this process are unintentional and may not be fully corrected at the time of dictation.

## 2023-04-01 NOTE — Patient Instructions (Addendum)
We discussed the possibilities that may be causing your symptoms.  1 possibility is reflux.  This can mimic asthma.  Asthma of course is still a possibility.  We are giving you a trial of an inhaler called AirSupra this is 2 puffs up to 4 times a day if needed for shortness of breath.  Give that a try.  Let us know how you do with this inhaler so we can call in the prescription to your pharmacy.  We are also sending in a prescription for a medication for reflux that is called Protonix (pantoprazole) this is 1 tablet daily on an empty stomach.  We are getting breathing tests.  We are also getting a home sleep study that will help determine about your snoring.  We will see you in follow-up in 6 to 8 weeks time call sooner should any new problems arise.

## 2023-04-06 ENCOUNTER — Ambulatory Visit (HOSPITAL_COMMUNITY)
Admission: RE | Admit: 2023-04-06 | Discharge: 2023-04-06 | Disposition: A | Discharge status: Home / Self Care | Payer: 59 | Source: Ambulatory Visit | Attending: Pulmonary Disease | Admitting: Pulmonary Disease

## 2023-04-06 DIAGNOSIS — R0602 Shortness of breath: Secondary | ICD-10-CM | POA: Insufficient documentation

## 2023-04-06 LAB — PULMONARY FUNCTION TEST
DL/VA % pred: 104 %
DL/VA: 4.94 ml/min/mmHg/L
DLCO unc % pred: 95 %
DLCO unc: 34.69 ml/min/mmHg
FEF 25-75 Post: 3.58 L/sec
FEF 25-75 Pre: 6.51 L/sec
FEF2575-%Change-Post: -45 %
FEF2575-%Pred-Post: 76 %
FEF2575-%Pred-Pre: 139 %
FEV1-%Change-Post: -19 %
FEV1-%Pred-Post: 80 %
FEV1-%Pred-Pre: 100 %
FEV1-Post: 3.94 L
FEV1-Pre: 4.9 L
FEV1FVC-%Change-Post: -23 %
FEV1FVC-%Pred-Pre: 108 %
FEV6-%Change-Post: 9 %
FEV6-%Pred-Post: 97 %
FEV6-%Pred-Pre: 89 %
FEV6-Post: 5.87 L
FEV6-Pre: 5.37 L
FEV6FVC-%Pred-Post: 101 %
FEV6FVC-%Pred-Pre: 101 %
FVC-%Change-Post: 4 %
FVC-%Pred-Post: 96 %
FVC-%Pred-Pre: 91 %
FVC-Post: 5.87 L
FVC-Pre: 5.6 L
Post FEV1/FVC ratio: 67 %
Post FEV6/FVC ratio: 100 %
Pre FEV1/FVC ratio: 88 %
Pre FEV6/FVC Ratio: 100 %
RV % pred: 70 %
RV: 1.35 L
TLC % pred: 91 %
TLC: 7.07 L

## 2023-04-06 MED ORDER — ALBUTEROL SULFATE (2.5 MG/3ML) 0.083% IN NEBU
2.5000 mg | INHALATION_SOLUTION | Freq: Once | RESPIRATORY_TRACT | Status: AC
  Administered 2023-04-06: 2.5 mg via RESPIRATORY_TRACT

## 2023-05-03 ENCOUNTER — Encounter (INDEPENDENT_AMBULATORY_CARE_PROVIDER_SITE_OTHER): Payer: 59

## 2023-05-03 DIAGNOSIS — G4733 Obstructive sleep apnea (adult) (pediatric): Secondary | ICD-10-CM

## 2023-05-03 DIAGNOSIS — R0683 Snoring: Secondary | ICD-10-CM

## 2023-05-06 ENCOUNTER — Other Ambulatory Visit: Payer: Self-pay

## 2023-05-06 DIAGNOSIS — G4733 Obstructive sleep apnea (adult) (pediatric): Secondary | ICD-10-CM

## 2023-05-18 ENCOUNTER — Encounter: Payer: Self-pay | Admitting: Pulmonary Disease

## 2023-05-18 ENCOUNTER — Ambulatory Visit: Payer: 59 | Admitting: Pulmonary Disease

## 2023-05-18 VITALS — BP 124/84 | HR 88 | Temp 98.1°F | Ht 74.0 in | Wt 298.2 lb

## 2023-05-18 DIAGNOSIS — K219 Gastro-esophageal reflux disease without esophagitis: Secondary | ICD-10-CM | POA: Diagnosis not present

## 2023-05-18 DIAGNOSIS — R0602 Shortness of breath: Secondary | ICD-10-CM | POA: Diagnosis not present

## 2023-05-18 DIAGNOSIS — G4733 Obstructive sleep apnea (adult) (pediatric): Secondary | ICD-10-CM | POA: Diagnosis not present

## 2023-05-18 NOTE — Progress Notes (Signed)
Subjective:    Patient ID: [place], male    DOB: 1985-11-27, 37 y.o.   MRN: [id number]  Patient Care Team: Georgina Quint, MD as PCP - General (Internal Medicine) O'Neal, Ronnald Ramp, MD as PCP - Cardiology (Internal Medicine)  Chief Complaint  Patient presents with   Follow-up    Better. SOB with stairs. No wheezing or cough.    HPI [place] is a 37 year old lifelong never smoker with a history as noted below who presents for follow-up on the issue of shortness of breath and choking sensation when he laughs.  Symptoms started after he had COVID in May 2021.  He had noted a dry nonproductive cough after that.  I initially evaluated him on 01 April 2023 at that time he was given a trial of AirSupra which actually has helped his cough and shortness of breath.  He now only notices the shortness of breath when he goes upstairs.  He had pulmonary function testing performed on 13 August at [facility].  This was a normal pulmonary function test.  During his initial visit it was also noted that he was a risk for sleep apnea and he had a sleep study performed on 16 August.  This showed severe mixed sleep apnea (obstructive/central) with a total apnea index of 44.5/h, O2 nadir was 73%, he is scheduled for an in lab titration on 1 October.   At his initial visit he was also given a trial of Protonix for potential GERD/LPR however he noted that the medication actually made him more symptomatic with regards to reflux and he discontinued it.  DATA 03/16/2023 chest x-ray PA and lateral: No acute cardiopulmonary process. 04/06/2023 PFTs: FEV1 4.90 L or 100% predicted, FVC 5.60 L or 91% predicted, FEV1/FVC 88%.  Lung volumes normal.  Diffusion capacity normal.  No bronchodilator response.  Normal PFTs. 04/09/2023 Home sleep study: Severe sleep apnea and severe oxygen desaturations central/mixed apneas included.  Total apnea index 44.5/h O2 nadir 73%.  Review of Systems A 10 point review of systems  was performed and it is as noted above otherwise negative.   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   Dyspnea on exertion 03/16/2023   Other constipation 02/14/2022   Hemorrhoids 02/14/2022   Family history of colon cancer 02/14/2022   Family history of colonic polyps 02/14/2022   Class 1 obesity due to excess calories without serious comorbidity with body mass index (BMI) of 33.0 to 33.9 in adult 05/23/2019   Other specified postprocedural states 11/02/2017   Impingement syndrome of shoulder region 09/14/2017   Chronic ethmoidal sinusitis 11/24/2016   Chronic maxillary sinusitis 11/24/2016   Deviated septum 11/24/2016   GERD (gastroesophageal reflux disease) 03/18/2015   Vitamin D deficiency 01/07/2015    Social History   Tobacco Use   Smoking status: Never   Smokeless tobacco: Never  Substance Use Topics   Alcohol use: Yes    Alcohol/week: 0.0 standard drinks of alcohol    Comment: socially    Allergies  Allergen Reactions   Gadolinium Derivatives Nausea And Vomiting   Lactose Intolerance (Gi) Other (See Comments)    GI UPSET   Percocet [Oxycodone-Acetaminophen] Other (See Comments)    "SKIN CRAWLING" SENSATION   Fentanyl Other (See Comments)    Received in hospital 2015 - "heart rate dropped to 13"    Current Meds  Medication Sig   Albuterol-Budesonide (AIRSUPRA) 90-80 MCG/ACT AERO Inhale 2 puffs into the  lungs every 4 (four) hours as needed.   Cholecalciferol (VITAMIN D3 PO) Take by mouth daily.   Glucosamine HCl (GLUCOSAMINE PO) Take by mouth 2 (two) times daily.   levocetirizine (XYZAL) 5 MG tablet Take 2.5 mg by mouth every evening.   Multiple Vitamins-Minerals (MULTIVITAMIN ADULT PO) Take 1 tablet by mouth daily. Reported on 01/18/2016    Immunization History  Administered Date(s) Administered   PFIZER Comirnaty(Gray Top)Covid-19 Tri-Sucrose Vaccine 01/29/2021   PFIZER(Purple Top)SARS-COV-2 Vaccination  11/04/2019, 11/25/2019   Tdap 03/18/2015        Objective:   BP 124/84 (BP Location: Right Arm, Cuff Size: Large)   Pulse 88   Temp 98.1 F (36.7 C)   Ht 6\' 2"  (1.88 m)   Wt 298 lb 3.2 oz (135.3 kg)   SpO2 99%   BMI 38.29 kg/m   SpO2: 99 % O2 Device: None (Room air)  GENERAL: Obese gentleman, no acute distress, fully ambulatory, no conversational dyspnea. HEAD: Normocephalic, atraumatic.  EYES: Pupils equal, round, reactive to light.  No scleral icterus.  MOUTH: Nose/mouth/throat not examined due to institutional/personal preference masking requirements. NECK: Supple. No thyromegaly. Trachea midline. No JVD.  No adenopathy. PULMONARY: Good air entry bilaterally.  No adventitious sounds noted today. CARDIOVASCULAR: S1 and S2. Regular rate and rhythm.  No rubs, murmurs or gallops heard. ABDOMEN: Obese otherwise benign. MUSCULOSKELETAL: No joint deformity, no clubbing, no edema.  NEUROLOGIC: No overt focal deficit, no gait disturbance, speech is fluent. SKIN: Intact,warm,dry. PSYCH: Mood and behavior normal.    Assessment & Plan:     ICD-10-CM   1. OSA (obstructive sleep apnea) -SEVERE, mixed  G47.33    Will have sleep titration study 1 October Patient resides/works in [place] Will have sleep medicine follow him in [place] Discussed with Dr. Vassie Loll    2. SOB (shortness of breath)  R06.02    Markedly improved Response to AirSupra as needed Continue for now    3. Gastroesophageal reflux disease, unspecified whether esophagitis present  K21.9    Following antireflux measures PPI worsened symptoms     Patient is to have titration study performed 1 October.  He resides and works in [place].  We will arrange for him to follow-up with Dr. Vassie Loll.  The patient will see APP after titration study is performed.    Gailen Shelter, MD Advanced Bronchoscopy PCCM [facility]    *This note was dictated using voice recognition software/Dragon.   Despite best efforts to proofread, errors can occur which can change the meaning. Any transcriptional errors that result from this process are unintentional and may not be fully corrected at the time of dictation.

## 2023-05-18 NOTE — Patient Instructions (Signed)
We are going to arrange for a follow-up with you at our [place] office.  You will see one of our nurse practitioners after your titration study in 1 October so that we can get your equipment set up.  After that Dr. Vassie Loll will continue your care for sleep apnea.

## 2023-06-02 ENCOUNTER — Ambulatory Visit: Payer: 59 | Attending: Otolaryngology

## 2023-06-02 DIAGNOSIS — R0683 Snoring: Secondary | ICD-10-CM | POA: Diagnosis present

## 2023-06-02 DIAGNOSIS — G479 Sleep disorder, unspecified: Secondary | ICD-10-CM | POA: Diagnosis not present

## 2023-06-02 DIAGNOSIS — G4761 Periodic limb movement disorder: Secondary | ICD-10-CM | POA: Diagnosis not present

## 2023-06-21 ENCOUNTER — Telehealth (INDEPENDENT_AMBULATORY_CARE_PROVIDER_SITE_OTHER): Payer: 59 | Admitting: Pulmonary Disease

## 2023-06-21 DIAGNOSIS — G4733 Obstructive sleep apnea (adult) (pediatric): Secondary | ICD-10-CM

## 2023-06-21 NOTE — Telephone Encounter (Signed)
Will await Dr. Gonzalez's response.  

## 2023-06-21 NOTE — Telephone Encounter (Signed)
BiPAP 15/11 cm needed with med F& P simplus mask OV with APP in 6 wks after starting

## 2023-06-22 ENCOUNTER — Encounter: Payer: Self-pay | Admitting: Emergency Medicine

## 2023-06-22 NOTE — Telephone Encounter (Signed)
Lets order supplies needed with regards to BiPAP.  He lives in [place] and would prefer his follow-ups there.  He is also strictly sleep apnea patient.  He will be seeing Beth and then follow-up with Dr. Vassie Loll after that.

## 2023-06-23 NOTE — Telephone Encounter (Signed)
Patient is aware of results and voiced his understanding. He agrees with bipap. Order has bene placed.  He is aware of need for appt 31-90 day after being setup on bipap, however he would like to keep scheduled visit for 07/01/2023 to establish care. Nothing further needed.

## 2023-06-23 NOTE — Addendum Note (Signed)
Addended by: Lajoyce Lauber A on: 06/23/2023 04:57 PM   Modules accepted: Orders

## 2023-07-01 ENCOUNTER — Ambulatory Visit: Payer: 59 | Admitting: Primary Care

## 2023-07-01 ENCOUNTER — Encounter: Payer: Self-pay | Admitting: Primary Care

## 2023-07-01 ENCOUNTER — Telehealth: Payer: Self-pay | Admitting: Primary Care

## 2023-07-01 VITALS — BP 124/85 | HR 86 | Ht 74.0 in | Wt 291.4 lb

## 2023-07-01 DIAGNOSIS — G4730 Sleep apnea, unspecified: Secondary | ICD-10-CM | POA: Diagnosis not present

## 2023-07-01 DIAGNOSIS — I890 Lymphedema, not elsewhere classified: Secondary | ICD-10-CM | POA: Diagnosis not present

## 2023-07-01 NOTE — Progress Notes (Signed)
Agree with the details of the visit as noted by Ames Dura, NP.  Patient will follow with Dr. Vassie Loll at our [facility] office as he resides in [place] and requires management of his severe sleep apnea.  Gailen Shelter, MD [place] [facility]

## 2023-07-01 NOTE — Progress Notes (Signed)
@Patient  ID: Marya Fossa, male    DOB: 1985-10-06, 37 y.o.   MRN: [id number]  No chief complaint on file.   Referring provider: Georgina Quint, *  HPI: 37 year old male, never smoked.  Past medical history significant for chronic sinusitis, deviated septum, GERD, obesity, vitamin D deficiency, post-COVID chronic cough, loud snoring.  Patient of Dr. Jayme Cloud, seen on 05/18/2023.  07/01/2023 Discussed the use of AI scribe software for clinical note transcription with the patient, who gave verbal consent to proceed.  History of Present Illness   Patient presents today to review CPAP titration study. He had a home sleep study on August 16th which revealed severe OSA, AHI 77/hour with spo2 low 73%. The patient underwent a titration study, which showed an apnea score of 18.4/hr. The study revealed a mix of obstructive and central sleep apneas, with a higher frequency of obstructive apneas.  The patient was then put on BiPAP with optimal pressure setting of 15/11cm h20. The patient did not require supplemental oxygen.  In addition to sleep apnea, the patient also suffers from bilateral lymphedema, which has been worsening over the past few months. The patient experiences swelling, discomfort, and occasional joint lock-up due to the lymphedema. The patient manages the lymphedema with a FlexiTouch system, a compression garment, twice a day for one hour each time.  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 This has resulted in increased numbness, tingling, and a burning sensation. The patient has been feeling fatigued for a couple of years, which she attributes to the sleep apnea and the lymphedema.  The patient has a family history of sleep apnea, with his mother developing it after an accident. 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 The patient is also working on weight loss as a long-term goal.      Allergies  Allergen Reactions   Gadolinium Derivatives Nausea And Vomiting   Lactose Intolerance (Gi) Other (See Comments)    GI UPSET   Percocet [Oxycodone-Acetaminophen] Other (See Comments)    "SKIN CRAWLING" SENSATION   Fentanyl Other (See Comments)    Received in hospital 2015 - "heart rate dropped to 13"    Immunization History  Administered Date(s) Administered   PFIZER Comirnaty(Gray Top)Covid-19 Tri-Sucrose Vaccine 01/29/2021   PFIZER(Purple Top)SARS-COV-2 Vaccination 11/04/2019, 11/25/2019   Tdap 03/18/2015    Past Medical History:  Diagnosis Date   Arthritis of big toe    left   Bulging of cervical intervertebral disc    Chicken pox 1992   Chronic ethmoidal sinusitis 12/2016   Chronic maxillary sinusitis 12/2016   Dental crowns present    Deviated nasal septum 12/2016   Family history of adverse reaction to anesthesia    pt's mother has hx. of being hard to wake up post-op   Lip laceration 12/28/2016   no stitches required, per pt.   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     Cholecalciferol (VITAMIN D3 PO)  Take by mouth daily.     Glucosamine HCl (GLUCOSAMINE PO) Take by mouth 2 (two) times daily.     levocetirizine (XYZAL) 5 MG tablet Take 2.5 mg by mouth every evening.     Multiple Vitamins-Minerals (MULTIVITAMIN ADULT PO) Take 1 tablet by mouth daily. Reported on 01/18/2016     pantoprazole (PROTONIX) 40 MG tablet Take 1 tablet (40 mg total) by mouth daily. (Patient not taking: Reported on  05/18/2023) 30 tablet 2   No facility-administered medications prior to visit.   Review of Systems  Review of Systems  Constitutional:  Positive for fatigue.  Respiratory: Negative.    Cardiovascular: Negative.     Physical Exam  There were no vitals taken for this visit. Physical Exam Constitutional:      General: He is not in acute distress.    Appearance: Normal appearance. He is not ill-appearing.  HENT:     Head: Normocephalic and atraumatic.  Cardiovascular:     Rate and Rhythm: Normal rate and regular rhythm.  Pulmonary:     Effort: Pulmonary effort is normal.     Breath sounds: Normal breath sounds.  Neurological:     General: No focal deficit present.     Mental Status: He is alert and oriented to person, place, and time. Mental status is at baseline.  Psychiatric:        Mood and Affect: Mood normal.        Behavior: Behavior normal.        Thought Content: Thought content normal.        Judgment: Judgment normal.      Lab Results:  CBC    Component Value Date/Time   WBC 7.1 03/16/2023 1510   RBC 5.53 03/16/2023 1510   HGB 16.5 03/16/2023 1510   HGB 16.2 02/28/2018 1049   HCT 49.3 03/16/2023 1510   HCT 45.5 02/28/2018 1049   PLT 177.0 03/16/2023 1510   PLT 170 02/28/2018 1049   MCV 89.2 03/16/2023 1510   MCV 87.8 05/27/2018 1435   MCV 86 02/28/2018 1049   MCH 29.9 01/22/2021 0928   MCHC 33.4 03/16/2023 1510   RDW 13.8 03/16/2023 1510   RDW 14.1 02/28/2018 1049   LYMPHSABS 2.0 03/16/2023 1510   MONOABS 0.5 03/16/2023 1510   EOSABS 0.2 03/16/2023 1510   BASOSABS 0.0 03/16/2023 1510    BMET    Component Value Date/Time   NA 137 03/16/2023 1510   NA 141 02/28/2018 0822   K 4.4 03/16/2023 1510   CL 104 03/16/2023 1510   CO2 26 03/16/2023 1510   GLUCOSE 82 03/16/2023 1510   BUN 15 03/16/2023 1510   BUN 15 02/28/2018 0822   CREATININE 1.09 03/16/2023 1510   CREATININE 1.10 01/22/2021 0928   CALCIUM 9.4 03/16/2023 1510   GFRNONAA >60  07/16/2020 1836   GFRNONAA 82 03/24/2016 1551   GFRAA >60 09/23/2018 1739   GFRAA >89 03/24/2016 1551    BNP No results found for: "BNP"  ProBNP No results found for: "PROBNP"  Imaging: Sleep Study Documents  Result Date: 06/25/2023 Ordered by an unspecified provider.  Sleep Study Documents  Result Date: 06/07/2023 Ordered by an unspecified provider.    Assessment & Plan:   1. Severe sleep apnea  Severe Sleep Apnea Home sleep study showed 77 apneic events per hour with oxygen desaturation to 73%. Titration study showed improvement with BiPAP at 15/11 cm H2O, reducing apnea score to 0 and maintaining oxygen saturation at 94%. -Initiate BiPAP  therapy with settings at 15/11 cm H2O. DME has already been placed and patient will be picking up device in 1 week -Ensure enrollment in [facility] program for remote monitoring of compliance and efficacy. -Schedule follow-up in 6-8 weeks to assess response to therapy.  Bilateral Lymphedema Chronic condition, worsening over the past few months with increased swelling and discomfort. Current treatment includes twice daily use of a compression garment system (FlexiTouch). -Refer to [facility] for further management.  General Health Maintenance -Encourage continued efforts towards weight loss.   Glenford Bayley, NP 07/01/2023

## 2023-07-01 NOTE — Telephone Encounter (Signed)
??   We will need an order

## 2023-07-01 NOTE — Telephone Encounter (Signed)
Can we refer patient to lymphedema clinic   [facility]  [address] [place] [phone]  Needs to establish with - Senaida Ores Preeze, OTR/l, CLT

## 2023-07-01 NOTE — Patient Instructions (Addendum)
Recommendation: Aim to wear BIPAP nightly for 4 to 6 hours or longer and with naps Continue to work on weight loss Do not drive if tired  Make sure DME company enrolls you in [facility]   Follow-up: 31-90 days for BIPAP compliance  (roughly 6-8 weeks with Waynetta Sandy NP- virtual clinic on Friday afternoon if patient is able)    CPAP and BIPAP Information CPAP and BIPAP are methods that use air pressure to keep your airways open and to help you breathe well. CPAP and BIPAP use different amounts of pressure. Your health care provider will tell you whether CPAP or BIPAP would be more helpful for you. CPAP stands for "continuous positive airway pressure." With CPAP, the amount of pressure stays the same while you breathe in (inhale) and out (exhale). BIPAP stands for "bi-level positive airway pressure." With BIPAP, the amount of pressure will be higher when you inhale and lower when you exhale. This allows you to take larger breaths. CPAP or BIPAP may be used in the hospital, or your health care provider may want you to use it at home. You may need to have a sleep study before your health care provider can order a machine for you to use at home. What are the advantages? CPAP or BIPAP can be helpful if you have: Sleep apnea. Chronic obstructive pulmonary disease (COPD). Heart failure. Medical conditions that cause muscle weakness, including muscular dystrophy or amyotrophic lateral sclerosis (ALS). Other problems that cause breathing to be shallow, weak, abnormal, or difficult. CPAP and BIPAP are most commonly used for obstructive sleep apnea (OSA) to keep the airways from collapsing when the muscles relax during sleep. What are the risks? Generally, this is a safe treatment. However, problems may occur, including: Irritated skin or skin sores if the mask does not fit properly. Dry or stuffy nose or nosebleeds. Dry mouth. Feeling gassy or bloated. Sinus or lung infection if the equipment is not cleaned  properly. When should CPAP or BIPAP be used? In most cases, the mask only needs to be worn during sleep. Generally, the mask needs to be worn throughout the night and during any daytime naps. People with certain medical conditions may also need to wear the mask at other times, such as when they are awake. Follow instructions from your health care provider about when to use the machine. What happens during CPAP or BIPAP?  Both CPAP and BIPAP are provided by a small machine with a flexible plastic tube that attaches to a plastic mask that you wear. Air is blown through the mask into your nose or mouth. The amount of pressure that is used to blow the air can be adjusted on the machine. Your health care provider will set the pressure setting and help you find the best mask for you. Tips for using the mask Because the mask needs to be snug, some people feel trapped or closed-in (claustrophobic) when first using the mask. If you feel this way, you may need to get used to the mask. One way to do this is to hold the mask loosely over your nose or mouth and then gradually apply the mask more snugly. You can also gradually increase the amount of time that you use the mask. Masks are available in various types and sizes. If your mask does not fit well, talk with your health care provider about getting a different one. Some common types of masks include: Full face masks, which fit over the mouth and nose. Nasal masks,  which fit over the nose. Nasal pillow or prong masks, which fit into the nostrils. If you are using a mask that fits over your nose and you tend to breathe through your mouth, a chin strap may be applied to help keep your mouth closed. Use a skin barrier to protect your skin as told by your health care provider. Some CPAP and BIPAP machines have alarms that may sound if the mask comes off or develops a leak. If you have trouble with the mask, it is very important that you talk with your health care  provider about finding a way to make the mask easier to tolerate. Do not stop using the mask. There could be a negative impact on your health if you stop using the mask. Tips for using the machine Place your CPAP or BIPAP machine on a secure table or stand near an electrical outlet. Know where the on/off switch is on the machine. Follow instructions from your health care provider about how to set the pressure on your machine and when you should use it. Do not eat or drink while the CPAP or BIPAP machine is on. Food or fluids could get pushed into your lungs by the pressure of the CPAP or BIPAP. For home use, CPAP and BIPAP machines can be rented or purchased through home health care companies. Many different brands of machines are available. Renting a machine before purchasing may help you find out which particular machine works well for you. Your health insurance company may also decide which machine you may get. Keep the CPAP or BIPAP machine and attachments clean. Ask your health care provider for specific instructions. Check the humidifier if you have a dry stuffy nose or nosebleeds. Make sure it is working correctly. Follow these instructions at home: Take over-the-counter and prescription medicines only as told by your health care provider. Ask if you can take sinus medicine if your sinuses are blocked. Do not use any products that contain nicotine or tobacco. These products include cigarettes, chewing tobacco, and vaping devices, such as e-cigarettes. If you need help quitting, ask your health care provider. Keep all follow-up visits. This is important. Contact a health care provider if: You have redness or pressure sores on your head, face, mouth, or nose from the mask or head gear. You have trouble using the CPAP or BIPAP machine. You cannot tolerate wearing the CPAP or BIPAP mask. Someone tells you that you snore even when wearing your CPAP or BIPAP. Get help right away if: You have  trouble breathing. You feel confused. Summary CPAP and BIPAP are methods that use air pressure to keep your airways open and to help you breathe well. If you have trouble with the mask, it is very important that you talk with your health care provider about finding a way to make the mask easier to tolerate. Do not stop using the mask. There could be a negative impact to your health if you stop using the mask. Follow instructions from your health care provider about when to use the machine. This information is not intended to replace advice given to you by your health care provider. Make sure you discuss any questions you have with your health care provider. Document Revised: 03/19/2021 Document Reviewed: 07/19/2020 Elsevier Patient Education  2023 [employer].

## 2023-07-05 ENCOUNTER — Ambulatory Visit: Admit: 2023-07-05 | Payer: 59

## 2023-07-06 ENCOUNTER — Ambulatory Visit: Payer: 59 | Admitting: Emergency Medicine

## 2023-07-06 ENCOUNTER — Other Ambulatory Visit: Payer: Self-pay | Admitting: Family Medicine

## 2023-07-06 ENCOUNTER — Ambulatory Visit: Payer: Self-pay

## 2023-07-06 DIAGNOSIS — M545 Low back pain, unspecified: Secondary | ICD-10-CM

## 2023-07-09 NOTE — Telephone Encounter (Signed)
Yes, can you do that?

## 2023-07-09 NOTE — Addendum Note (Signed)
Addended by: Glenford Bayley on: 07/09/2023 12:46 PM   Modules accepted: Orders

## 2023-07-09 NOTE — Telephone Encounter (Signed)
Routing to [place] in regards to what referral to be placed. Tried doing ambulatory referral lymphedema clinic but that could not be found.

## 2023-07-09 NOTE — Telephone Encounter (Signed)
I put a DME order in with the contact info (?)

## 2023-07-09 NOTE — Telephone Encounter (Signed)
There is an order for lymphedema therapy. Wonder if this order was placed and then you put all the contact information in the comment section under this order?

## 2023-07-12 NOTE — Telephone Encounter (Signed)
Order has been placed.

## 2023-07-15 ENCOUNTER — Telehealth: Payer: Self-pay | Admitting: Primary Care

## 2023-07-15 NOTE — Telephone Encounter (Signed)
Patient would like the setting on his bipap machine to be lowered (maybe by 2).

## 2023-07-18 DIAGNOSIS — G4733 Obstructive sleep apnea (adult) (pediatric): Secondary | ICD-10-CM

## 2023-07-19 NOTE — Telephone Encounter (Signed)
Change BIPAP pressure 14/10 . Make sure he is using a full face mask. Try settings ramp to auto

## 2023-07-20 NOTE — Telephone Encounter (Signed)
We placed an order yesterday for settings to be lowered.

## 2023-07-26 ENCOUNTER — Ambulatory Visit: Payer: 59 | Attending: Primary Care | Admitting: Occupational Therapy

## 2023-07-26 ENCOUNTER — Encounter: Payer: Self-pay | Admitting: Occupational Therapy

## 2023-07-26 DIAGNOSIS — I890 Lymphedema, not elsewhere classified: Secondary | ICD-10-CM | POA: Insufficient documentation

## 2023-07-26 NOTE — Therapy (Signed)
OUTPATIENT OCCUPATIONAL THERAPY LYMPEDEMA EVALUATION  Patient Name: Paul Humphrey MRN: [id number] DOB:1986/07/10, 37 y.o., male Today's Date: 07/26/2023  PCP: Dr Raiford Simmonds PROVIDER: Clent Ridges NP  END OF SESSION:  OT End of Session - 07/26/23 1936     Visit Number 1    Number of Visits 6    Date for OT Re-Evaluation 10/18/23    OT Start Time 1203    OT Stop Time 1306    OT Time Calculation (min) 63 min    Activity Tolerance Patient tolerated treatment well    Behavior During Therapy [facility] for tasks assessed/performed             Past Medical History:  Diagnosis Date   Arthritis of big toe    left   Bulging of cervical intervertebral disc    Chicken pox 1992   Chronic ethmoidal sinusitis 12/2016   Chronic maxillary sinusitis 12/2016   Dental crowns present    Deviated nasal septum 12/2016   Family history of adverse reaction to anesthesia    pt's mother has hx. of being hard to wake up post-op   Lip laceration 12/28/2016   no stitches required, per pt.   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    Sinus headache    Past Surgical History:  Procedure Laterality Date   CHOLECYSTECTOMY  2016   COLECTOMY  2013   COLOSTOMY  2013   COLOSTOMY REVERSAL  2014   LEG SURGERY Left 08/12/2005   lateral compartment release   NASAL SEPTOPLASTY W/ TURBINOPLASTY Bilateral 01/04/2017   Procedure: NASAL SEPTOPLASTY WITH TURBINATE REDUCTION;  Surgeon: Serena Colonel, MD;  Location: [facility];  Service: ENT;  Laterality: Bilateral;   NASAL SINUS SURGERY Bilateral 01/04/2017   Procedure: ENDOSCOPIC SINUS SURGERY;  Surgeon: Serena Colonel, MD;  Location: [facility];  Service: ENT;  Laterality: Bilateral;   SHOULDER SURGERY Bilateral    UMBILICAL HERNIA REPAIR  2016   x 2   URETERAL STENT PLACEMENT  2014   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   Dyspnea on exertion  03/16/2023   Other constipation 02/14/2022   Hemorrhoids 02/14/2022   Family history of colon cancer 02/14/2022   Family history of colonic polyps 02/14/2022   Class 1 obesity due to excess calories without serious comorbidity with body mass index (BMI) of 33.0 to 33.9 in adult 05/23/2019   Other specified postprocedural states 11/02/2017   Impingement syndrome of shoulder region 09/14/2017   Chronic ethmoidal sinusitis 11/24/2016   Chronic maxillary sinusitis 11/24/2016   Deviated septum 11/24/2016   GERD (gastroesophageal reflux disease) 03/18/2015   Vitamin D deficiency 01/07/2015    ONSET DATE: Oct 23  REFERRING DIAG: Lymphedema  THERAPY DIAG:  Lymphedema, not elsewhere classified  Rationale for Evaluation and Treatment: Rehabilitaion  SUBJECTIVE:   SUBJECTIVE STATEMENT: I have been doing good with the Flexitouch pump for my chest and arms.  But since October 23 starting to feel tightness, fullness, swelling into my neck and my cheeks.  And I feel it into my cervical spine causing that same numbness and spasms I got in the past into my R 4th and 5th fingers.  I even got up with vertigo 5 times/year. I tried to remember the massage you taught me in 21- I feel popping in my neck when feeling that fullness and tightness- last time- I am now on bipap for sleeping.  Pt  accompanied by: self  PERTINENT HISTORY: History of Present Illness  - NP NOTE 07/01/23  Patient presents today to review CPAP titration study. He had a home sleep study on August 16th which revealed severe OSA, AHI 77/hour with spo2 low 73%. The patient underwent a titration study, which showed an apnea score of 18.4/hr. The study revealed a mix of obstructive and central sleep apneas, with a higher frequency of obstructive apneas.   The patient was then put on BiPAP with optimal pressure setting of 15/11cm h20. The patient did not require supplemental oxygen.   In addition to sleep apnea, the patient also suffers  from bilateral lymphedema, which has been worsening over the past few months. The patient experiences swelling, discomfort, and occasional joint lock-up due to the lymphedema. The patient manages the lymphedema with a FlexiTouch system, a compression garment, twice a day for one hour each time.   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 This has resulted in increased numbness, tingling, and a burning sensation. The patient has been feeling fatigued for a couple of years, which she attributes to the sleep apnea and the lymphedema.   The patient has a family history of sleep apnea, with his mother developing it after an accident. 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 The patient is also working on weight loss as a long-term goal.  Refer to OT      PRECAUTIONS: None    WEIGHT BEARING RESTRICTIONS: No  PAIN:  Are you having pain? No  FALLS: Has patient fallen in last 6 months? 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  HAND DOMINANCE: Right  ADLs: [facility]   UPPER EXTREMITY ROM: Bilateral shoulder active range of motion within normal limits for flexion and abduction as well as external and internal rotation Patient with increased swelling in left pectoralis major area into the shoulder and cervical region /collarbone Patient reports using Flexitouch last night on the right side but he slept on his left side. Circumference of the  chest was 131 cm.  About the same than 2021. Left breast with increased swelling compared to the right -fuller , heavier and lower visually Left side of face and cheek fuller    LYMPHEDEMA/ONCOLOGY QUESTIONNAIRE - 07/26/23 0001       Right Upper Extremity Lymphedema   15 cm Proximal to Olecranon Process 37 cm    10 cm Proximal to Olecranon Process 33.5 cm    Olecranon Process 32 cm    15 cm Proximal to Ulnar Styloid Process 30.4 cm    10 cm Proximal to Ulnar Styloid Process 25.3 cm    Just Proximal to Ulnar Styloid Process 18.4 cm    Across Hand at [facility] 23.5 cm      Left Upper Extremity Lymphedema   15 cm Proximal to Olecranon Process 36.8 cm    10 cm Proximal to Olecranon Process 32.5 cm    Olecranon Process  31.4 cm    15 cm Proximal to Ulnar Styloid Process 29 cm    10 cm Proximal to Ulnar Styloid Process 24 cm    Just Proximal to Ulnar Styloid Process 17.4 cm    Across Hand at [facility] 23 cm                 EDEMA: Chest circumference 131 cm Right ear to nose 13.5 cm Left ear to nose 14 cm Right ear to right corner of mouth 12 cm Left ear to left corner of mouth 12.5 cm  COGNITION: Overall cognitive status: Within functional limits for tasks assessed     TODAY'S TREATMENT:                                                                                                                              DATE: 07/26/23 Patient to assess if using pump on the right or left as well as depending on sleeping left or right if noticing increased fullness, heaviness and swelling in the breast as well as the pectoralis major as well as the face. Patient report pumping right side last night but sleeping on the left.  And left chest pectoralis major as well as cheek with increased measurements Left upper arm also increased more compared to 2021 in comparison to right increased Educated patient also on lymphatic drainage for head and neck.  Handout provided and  reviewed.  Patient was educated in 2021 but was not following sequence correctly    PATIENT EDUCATION: Education details: findings of eval and HEP  Person educated: Patient Education method: Explanation, Demonstration, Tactile cues, Verbal cues, and Handouts Education comprehension: verbalized understanding, returned demonstration, verbal cues required, and needs further education    GOALS: Goals reviewed with patient? Yes  LONG TERM GOALS: Target date: 12 wks   Patient to demonstrate self MLD to head and neck to per report decreased symptoms with cervical range of motion, sensory changes in fourth of fifth digits on the right as well as less popping and cracking with  cervical range of motion Baseline: Patient report increased fullness as well as swelling and pressure in head and neck since October 23.  With increased popping and cracking again with cervical range of motion, increased spasm and numbness in the right fourth and fifth digits as well as vertigo symptoms. Goal status: INITIAL  2.  Patient to be fitted with correct compression garment or/ and pump attachments to decrease lymphedema symptoms in head and neck. Baseline: Patient has a Flexitouch for thoracic and upper extremity lymphedema.  Patient has Jovi pack to use on thoracic.  But no compression or pump attachment for head and neck. Goal status: INITIAL ASSESSMENT:  CLINICAL IMPRESSION: Patient seen today for occupational therapy evaluation for lymphedema.  Patient was seen in 2021 for thoracic and bilateral upper extremity lymphedema.  Patient was fitted with a Flexitouch pump as well as bilateral postmastectomy Jovi pak.  Patient  has been doing really well.  Patient reports since October 23 having increased swelling, fullness, and discomfort neck and face.  Measurements compared to 21 is increased in bilateral upper extremities with left more than right.  But patient show increase swelling in left pectoralis major, chest,  face.  Patient used Flexitouch pump on the right side last night but slept on the left side.  Confirming increase lymphedema in left chest, pectoralis and face.  Patient reports increased symptoms since October 23 resulting in some numbness pins-and-needles reported in fourth of fifth digit again as well as increased popping with cervical range of motion.  Patient can benefit from head and neck attachment for his Flexitouch to decrease symptoms in face, neck and cervical.  With increased symptoms on the left side today patient also had decreased cervical lateral flexion 45 degrees to the right and left 50.  Rotation was about the same 55 degrees and cervical flexion was 55 and extension 45.  Patient can benefit from OT services to assess results with use of head and neck attachment to Flexitouch pump.  OT will reach out to rep to check on insurance as well as fitting.  Did show patient that there is a Jovi pack for cervical and head and neck that can be used.  Reviewed with patient again manual lymph drainage for head and neck sequence.  PERFORMANCE DEFICITS: in functional skills including ADLs, IADLs, ROM, strength, pain, flexibility, decreased knowledge of use of DME, and UE functional use,   and psychosocial skills including environmental adaptation and routines and behaviors.   IMPAIRMENTS: are limiting patient from ADLs, IADLs, rest and sleep, play, leisure, and social participation.   COMORBIDITIES: has no other co-morbidities that affects occupational performance. Patient will benefit from skilled OT to address above impairments and improve overall function.  MODIFICATION OR ASSISTANCE TO COMPLETE EVALUATION: No modification of tasks or assist necessary to complete an evaluation.  OT OCCUPATIONAL PROFILE AND HISTORY: Problem focused assessment: Including review of records relating to presenting problem.  CLINICAL DECISION MAKING: LOW - limited treatment options, no task modification  necessary  REHAB POTENTIAL: Good for goals  EVALUATION COMPLEXITY: Low   PLAN:  OT FREQUENCY: every other week  OT DURATION: 12 weeks  PLANNED INTERVENTIONS: 97535 self care/ADL training, 16109 therapeutic exercise, 97140 manual therapy, manual lymph drainage, compression bandaging, coping strategies training, patient/family education, and DME and/or AE instructions   CONSULTED AND AGREED WITH PLAN OF CARE: Patient     Oletta Cohn, OTR/L,CLT 07/26/2023, 7:43 PM

## 2023-08-13 ENCOUNTER — Telehealth: Payer: 59 | Admitting: Primary Care

## 2023-08-13 DIAGNOSIS — G4730 Sleep apnea, unspecified: Secondary | ICD-10-CM

## 2023-08-13 NOTE — Patient Instructions (Signed)
-  OBSTRUCTIVE SLEEP APNEA: Obstructive sleep apnea is a condition where your breathing repeatedly stops and starts during sleep. Your current BiPAP settings of 14 over 10 have significantly reduced your apnea events to less than one per hour, improving your sleep quality and reducing daytime fatigue. Continue using your BiPAP machine at the current settings. If mask leaks persist, consider getting a mask refitting.  -LYMPHEDEMA: Lymphedema is a condition characterized by swelling due to the build-up of lymph fluid. You are managing this condition with daily use of a compression machine and attending therapy. Continue with your current management plan.  INSTRUCTIONS: Please follow up in 6 months or sooner if any issues arise. Continue using your BiPAP machine nightly for 4-6 hours or longer.

## 2023-08-13 NOTE — Progress Notes (Signed)
Virtual Visit via Video Note  I connected with Paul Humphrey on 08/13/23 at  2:00 PM EST by a video enabled telemedicine application and verified that I am speaking with the correct person using two identifiers.  Location: Patient: Home Provider: Office     I discussed the limitations of evaluation and management by telemedicine and the availability of in person appointments. The patient expressed understanding and agreed to proceed.  History of Present Illness: 37 year old male, never smoked.  Past medical history significant for chronic sinusitis, deviated septum, GERD, obesity, vitamin D deficiency, post-COVID chronic cough, loud snoring.  Patient of Dr. Jayme Cloud, seen on 05/18/2023.  Previous [facility] pulmonary encounter:  07/01/2023 Discussed the use of AI scribe software for clinical note transcription with the patient, who gave verbal consent to proceed.  History of Present Illness   Patient presents today to review CPAP titration study. He had a home sleep study on August 16th which revealed severe OSA, AHI 77/hour with spo2 low 73%. The patient underwent a titration study, which showed an apnea score of 18.4/hr. The study revealed a mix of obstructive and central sleep apneas, with a higher frequency of obstructive apneas.  The patient was then put on BiPAP with optimal pressure setting of 15/11cm h20. The patient did not require supplemental oxygen.  In addition to sleep apnea, the patient also suffers from bilateral lymphedema, which has been worsening over the past few months. The patient experiences swelling, discomfort, and occasional joint lock-up due to the lymphedema. The patient manages the lymphedema with a FlexiTouch system, a compression garment, twice a day for one hour each time.  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 This has resulted in increased numbness, tingling, and a  burning sensation. The patient has been feeling fatigued for a couple of years, which she attributes to the sleep apnea and the lymphedema.  The patient has a family history of sleep apnea, with his mother developing it after an accident. 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 The patient is also working on weight loss as a long-term goal.      Severe Sleep Apnea Home sleep study showed 77 apneic events per hour with oxygen desaturation to 73%. Titration study showed improvement with BiPAP at 15/11 cm H2O, reducing apnea score to 0 and maintaining oxygen saturation at 94%. -Initiate BiPAP therapy with settings at 15/11 cm H2O. DME has already been placed and patient will be picking up device in 1 week -Ensure enrollment in [employer] program for remote monitoring of compliance and efficacy. -Schedule follow-up in 6-8 weeks to assess response to therapy.  08/13/2023- Interim hx  Discussed the use of AI scribe software for clinical note transcription with the patient, who gave verbal consent to proceed.  History of Present Illness   The patient, previously diagnosed with severe sleep apnea, had been prescribed a BiPAP machine following a titration study. The initial pressure setting of 15/11 was reported as too strong, leading to an adjustment. Since the adjustment, the patient has reported improved sleep quality, with fewer interruptions throughout the night. He has also noted a reduction in daytime fatigue and sleepiness, including less dozing off while driving.  The patient has been using a full face mask and has found biotene to be helpful in managing dry mouth. However, he has reported some  air leaks, which appear to be positional and have not significantly impacted his overall score or sleep quality.  In addition to sleep apnea, the patient has been managing lymphedema with compression therapy. He uses a  compression machine daily, sometimes more than once, and has been attending therapy for this condition.  Overall, the patient's sleep apnea appears well controlled with the current BiPAP settings of 14/10, achieving an apnea score of less than one event per hour. This is a significant improvement from the initial 77 events per hour recorded during his sleep study. The patient has been consistently using the BiPAP machine for about six hours each night.      [employer]view download 07/13/2023 - 08/11/2023 Usage days 29/30 days (97%) Average usage days used 6 hours 2 minutes Pressure IPAP 14/EPAP 10 cm H2O Air leaks -mean air leaks 22.5 L/min; 88.6 L/min (95%) AHI 0.4   Observations/Objective:  Appear well; No overt respiratory symptoms  Assessment and Plan:  1. Severe sleep apnea (Primary)     Obstructive Sleep Apnea Severe sleep apnea with 77 events per hour on initial home sleep study. BiPAP titration study showed need for bi-level pressure of 15/11, reducing apneas to zero. He is 97% compliant with use >4 hours over the last 30 days. Current pressure setting is 14/10 with apnea score less than one event per hour. Patient reports improved sleep quality and reduced daytime sleepiness. Noted some mask leaks, but not affecting overall score. -Continue BiPAP at current settings. -Consider mask refitting if leaks persist.  Lymphedema Undergoing therapy and using compression machine 1-2 times daily. -Continue current management.  Follow-up in 6 months or sooner if issues arise. Continue BiPAP use nightly for 4-6 hours or longer.      Follow Up Instructions:    I discussed the assessment and treatment plan with the patient. The patient was provided an opportunity to ask questions and all were answered. The patient agreed with the plan and demonstrated an understanding of the instructions.   The patient was advised to call back or seek an in-person evaluation if the symptoms worsen or if the  condition fails to improve as anticipated.  I provided 22 minutes of non-face-to-face time during this encounter.   Glenford Bayley, NP

## 2023-08-13 NOTE — Progress Notes (Signed)
Agree with the details of the visit as noted by Ames Dura, NP.  Patient lives in [place] and requires follow-up by sleep medicine, previously discussed with Dr. Vassie Loll who will continue to follow patient.  Gailen Shelter, MD [facility]

## 2024-02-11 ENCOUNTER — Telehealth: Payer: 59 | Admitting: Primary Care

## 2024-02-21 ENCOUNTER — Telehealth: Admitting: Primary Care

## 2024-03-24 ENCOUNTER — Telehealth (HOSPITAL_BASED_OUTPATIENT_CLINIC_OR_DEPARTMENT_OTHER): Admitting: Primary Care

## 2024-03-24 DIAGNOSIS — G4733 Obstructive sleep apnea (adult) (pediatric): Secondary | ICD-10-CM

## 2024-03-24 NOTE — Patient Instructions (Signed)
  VISIT SUMMARY: You came in for your six-month follow-up appointment to discuss your severe sleep apnea and other health concerns. Your sleep apnea is well-controlled with your current BiPAP therapy, and you are experiencing significantly better sleep quality. We also discussed your lymphedema and weight management options.  YOUR PLAN: -OBSTRUCTIVE SLEEP APNEA: Obstructive sleep apnea is a condition where your airway becomes blocked during sleep, causing breathing pauses. Your condition is well-controlled with your current BiPAP therapy, which you are using consistently. Continue with your current BiPAP settings and monitor for air leaks. Consider replacing the mask cushion monthly and the headgear every six months.  -LYMPHEDEMA: Lymphedema is swelling that generally occurs in one of your arms or legs, often due to lymph node damage or removal. Continue using your compression therapy. We also discussed that weight loss could help manage your lymphedema, so consider discussing weight loss options with your primary care provider.  -OBESITY: Obesity is a condition where you have an excessive amount of body fat. We discussed the potential use of a medication called Zepbound (tirzepatide) to aid in weight loss, which could also improve your sleep apnea and lymphedema. Check with your insurance to see if Zepbound is covered. If not, consider the self-pay option. If you decide to pursue this medication, schedule a follow-up virtual visit to discuss starting and adjusting the dosage.  INSTRUCTIONS: Check with your insurance for coverage of Zepbound for obstructive sleep apnea and obesity. If not covered, consider the self-pay option. If you decide to pursue Zepbound, schedule a follow-up virtual visit to discuss initiation and titration of the medication.                      Contains text generated by Abridge.                                 Contains text  generated by Abridge.

## 2024-03-24 NOTE — Progress Notes (Signed)
 Virtual Visit via Video Note  I connected with Paul Humphrey on 03/24/24 at  3:00 PM EDT by a video enabled telemedicine application and verified that I am speaking with the correct person using two identifiers.  Location: Patient: Home Provider: Office   I discussed the limitations of evaluation and management by telemedicine and the availability of in person appointments. The patient expressed understanding and agreed to proceed.  History of Present Illness:  38 year old male, never smoked.  Past medical history significant for chronic sinusitis, deviated septum, GERD, obesity, vitamin D  deficiency, post-COVID chronic cough, loud snoring.  Patient of Dr. Tamea, seen on 05/18/2023.   Previous [facility] pulmonary encounter:  07/01/2023 Discussed the use of AI scribe software for clinical note transcription with the patient, who gave verbal consent to proceed.   History of Present Illness   Patient presents today to review CPAP titration study. He had a home sleep study on August 16th which revealed severe OSA, AHI 77/hour with spo2 low 73%. The patient underwent a titration study, which showed an apnea score of 18.4/hr. The study revealed a mix of obstructive and central sleep apneas, with a higher frequency of obstructive apneas.   The patient was then put on BiPAP with optimal pressure setting of 15/11cm h20. The patient did not require supplemental oxygen.   In addition to sleep apnea, the patient also suffers from bilateral lymphedema, which has been worsening over the past few months. The patient experiences swelling, discomfort, and occasional joint lock-up due to the lymphedema. The patient manages the lymphedema with a FlexiTouch system, a compression garment, twice a day for one hour each time.   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 This has resulted in increased numbness, tingling,  and a burning sensation. The patient has been feeling fatigued for a couple of years, which she attributes to the sleep apnea and the lymphedema.   The patient has a family history of sleep apnea, with his mother developing it after an accident. 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 The patient is also working on weight loss as a long-term goal.       Severe Sleep Apnea Home sleep study showed 77 apneic events per hour with oxygen desaturation to 73%. Titration study showed improvement with BiPAP at 15/11 cm H2O, reducing apnea score to 0 and maintaining oxygen saturation at 94%. -Initiate BiPAP therapy with settings at 15/11 cm H2O. DME has already been placed and patient will be picking up device in 1 week -Ensure enrollment in [facility] program for remote monitoring of compliance and efficacy. -Schedule follow-up in 6-8 weeks to assess response to therapy.   08/13/2023 Discussed the use of AI scribe software for clinical note transcription with the patient, who gave verbal consent to proceed.   History of Present Illness   The patient, previously diagnosed with severe sleep apnea, had been prescribed a BiPAP machine following a titration study. The initial pressure setting of 15/11 was reported as too strong, leading to an adjustment. Since the adjustment, the patient has reported improved sleep quality, with fewer interruptions throughout the night. He has also noted a reduction in daytime fatigue and sleepiness, including less dozing off while driving.   The patient has been using a full face mask and has found biotene to be helpful in managing dry  mouth. However, he has reported some air leaks, which appear to be positional and have not significantly impacted his overall score or sleep quality.   In addition to sleep apnea, the patient has been managing lymphedema with compression therapy. He uses a  compression machine daily, sometimes more than once, and has been attending therapy for this condition.   Overall, the patient's sleep apnea appears well controlled with the current BiPAP settings of 14/10, achieving an apnea score of less than one event per hour. This is a significant improvement from the initial 77 events per hour recorded during his sleep study. The patient has been consistently using the BiPAP machine for about six hours each night.       [facility] download 07/13/2023 - 08/11/2023 Usage days 29/30 days (97%) Average usage days used 6 hours 2 minutes Pressure IPAP 14/EPAP 10 cm H2O Air leaks -mean air leaks 22.5 L/min; 88.6 L/min (95%) AHI 0.4  03/24/2024- Interim hx  Discussed the use of AI scribe software for clinical note transcription with the patient, who gave verbal consent to proceed.  History of Present Illness Paul Humphrey is a 38 year old male with severe sleep apnea who presents for a six-month follow-up.  He was previously diagnosed with severe sleep apnea, with a sleep study showing seventy-seven events per hour. A titration study was performed, and he has been using bi-level therapy since then. His current BiPAP settings are an inspiratory pressure of fourteen and an expiratory pressure of ten. He uses the BiPAP 99% of the time over the last ninety days, averaging seven hours and thirty-nine minutes per night. His apnea score has improved to 1.9 events per hour with the BiPAP. He is experiencing some air leaks with his current mask, the Vytara. He reports significantly better sleep quality compared to last year.  In May, he experienced a respiratory infection, which was diagnosed as pneumonia. He has since recovered from this infection. No history of asthma and he does not smoke. A breathing test conducted last August was normal.  He also has lymphedema, for which he uses compression therapy. He is currently at a weight of 280 pounds. No history of diabetes, heart  disease, or thyroid  cancer.  [facility] download 12/25/23- 03/23/24 Usage days 89/90 days Average usage 7 hours 39 mins Pressure settings 14/10cm h20 AHI 1.9    Observations/Objective:  Appears well without overt respiratory symptoms  Assessment and Plan:  Assessment & Plan Obstructive sleep apnea, severe, on BiPAP therapy Severe obstructive sleep apnea with 77 events per hour on initial sleep study, now well-controlled with BiPAP therapy 14/10cm h20, showing less than 2 events per hour. Compliance is excellent, with 99% usage over the last 90 days, averaging 7 hours and 39 minutes per night. Air leaks are present but do not significantly affect apnea score or sleep quality. - Continue BiPAP therapy 14/10cm h20  - Monitor for air leaks and consider replacing mask cushion monthly and headgear every six months.  Lymphedema Lymphedema is present, and compression therapy is being utilized. Weight loss is discussed as a potential aid in managing lymphedema. - Continue using compression therapy. - Encourage weight loss   Obesity Obesity is present with a current weight of 280 pounds. Discussed potential use of GLP-1 receptor agonist, Zepbound (tirzepatide), FDA-approved for obesity and sleep apnea. The medication may aid in weight loss and subsequently improve sleep apnea and lymphedema. Insurance coverage is a consideration, and self-pay options are available. Potential side effects include nausea, vomiting, constipation,  and diarrhea. No history of thyroid  cancer or diabetes. - Check with insurance for coverage of Zepbound for obstructive sleep apnea and obesity. - Consider self-pay option for Zepbound if not covered by insurance. - If pursuing Zepbound, schedule a follow-up virtual visit to discuss initiation and titration of medication.   Follow Up Instructions:  1 year with Beth NP or sooner if needed    I discussed the assessment and treatment plan with the patient. The patient was  provided an opportunity to ask questions and all were answered. The patient agreed with the plan and demonstrated an understanding of the instructions.   The patient was advised to call back or seek an in-person evaluation if the symptoms worsen or if the condition fails to improve as anticipated.  I provided 22 minutes of non-face-to-face time during this encounter.   Almarie LELON Ferrari, NP

## 2024-05-09 NOTE — Telephone Encounter (Signed)
**Note De-identified  [place] Obfuscation** Please advise
